# Patient Record
Sex: Female | Born: 1945
Health system: Southern US, Community
[De-identification: ages and names within clinical notes are randomized; demographics above are authoritative.]

## PROBLEM LIST (undated history)

## (undated) DIAGNOSIS — E1169 Type 2 diabetes mellitus with other specified complication: Secondary | ICD-10-CM

## (undated) DIAGNOSIS — R519 Headache, unspecified: Secondary | ICD-10-CM

## (undated) DIAGNOSIS — Z9861 Coronary angioplasty status: Secondary | ICD-10-CM

## (undated) DIAGNOSIS — D649 Anemia, unspecified: Secondary | ICD-10-CM

## (undated) DIAGNOSIS — I2102 ST elevation (STEMI) myocardial infarction involving left anterior descending coronary artery: Secondary | ICD-10-CM

## (undated) DIAGNOSIS — I350 Nonrheumatic aortic (valve) stenosis: Secondary | ICD-10-CM

## (undated) DIAGNOSIS — K219 Gastro-esophageal reflux disease without esophagitis: Secondary | ICD-10-CM

## (undated) DIAGNOSIS — M1990 Unspecified osteoarthritis, unspecified site: Secondary | ICD-10-CM

## (undated) DIAGNOSIS — E785 Hyperlipidemia, unspecified: Secondary | ICD-10-CM

## (undated) DIAGNOSIS — T8859XA Other complications of anesthesia, initial encounter: Secondary | ICD-10-CM

## (undated) DIAGNOSIS — I2510 Atherosclerotic heart disease of native coronary artery without angina pectoris: Secondary | ICD-10-CM

## (undated) DIAGNOSIS — R51 Headache: Secondary | ICD-10-CM

## (undated) DIAGNOSIS — I10 Essential (primary) hypertension: Secondary | ICD-10-CM

## (undated) DIAGNOSIS — E669 Obesity, unspecified: Secondary | ICD-10-CM

## (undated) HISTORY — DX: Type 2 diabetes mellitus with other specified complication: E66.9

## (undated) HISTORY — DX: ST elevation (STEMI) myocardial infarction involving left anterior descending coronary artery: I21.02

## (undated) HISTORY — DX: Coronary angioplasty status: Z98.61

## (undated) HISTORY — DX: Hyperlipidemia, unspecified: E78.5

## (undated) HISTORY — PX: ABDOMINAL HYSTERECTOMY: SHX81

## (undated) HISTORY — DX: Type 2 diabetes mellitus with other specified complication: E11.69

## (undated) HISTORY — PX: OTHER SURGICAL HISTORY: SHX169

## (undated) HISTORY — DX: Essential (primary) hypertension: I10

## (undated) HISTORY — DX: Atherosclerotic heart disease of native coronary artery without angina pectoris: I25.10

---

## 1998-12-09 ENCOUNTER — Other Ambulatory Visit: Admission: RE | Admit: 1998-12-09 | Discharge: 1998-12-09 | Payer: Self-pay | Admitting: Family Medicine

## 1998-12-16 ENCOUNTER — Ambulatory Visit (HOSPITAL_COMMUNITY): Admission: RE | Admit: 1998-12-16 | Discharge: 1998-12-16 | Payer: Self-pay | Admitting: Family Medicine

## 1998-12-16 ENCOUNTER — Encounter: Payer: Self-pay | Admitting: Family Medicine

## 2001-12-30 ENCOUNTER — Encounter: Payer: Self-pay | Admitting: Family Medicine

## 2001-12-30 ENCOUNTER — Ambulatory Visit (HOSPITAL_COMMUNITY): Admission: RE | Admit: 2001-12-30 | Discharge: 2001-12-30 | Payer: Self-pay | Admitting: Family Medicine

## 2002-06-11 ENCOUNTER — Ambulatory Visit (HOSPITAL_COMMUNITY): Admission: RE | Admit: 2002-06-11 | Discharge: 2002-06-11 | Payer: Self-pay | Admitting: Family Medicine

## 2002-06-11 ENCOUNTER — Encounter: Payer: Self-pay | Admitting: Family Medicine

## 2002-07-23 ENCOUNTER — Other Ambulatory Visit: Admission: RE | Admit: 2002-07-23 | Discharge: 2002-07-23 | Payer: Self-pay | Admitting: Family Medicine

## 2002-08-26 ENCOUNTER — Encounter: Payer: Self-pay | Admitting: Emergency Medicine

## 2002-08-26 ENCOUNTER — Emergency Department (HOSPITAL_COMMUNITY): Admission: EM | Admit: 2002-08-26 | Discharge: 2002-08-26 | Payer: Self-pay | Admitting: Emergency Medicine

## 2009-12-01 ENCOUNTER — Inpatient Hospital Stay (HOSPITAL_COMMUNITY): Admission: EM | Admit: 2009-12-01 | Discharge: 2009-12-05 | Payer: Self-pay | Admitting: Emergency Medicine

## 2009-12-01 ENCOUNTER — Ambulatory Visit: Payer: Self-pay | Admitting: Internal Medicine

## 2009-12-01 ENCOUNTER — Ambulatory Visit: Payer: Self-pay | Admitting: Cardiology

## 2009-12-02 ENCOUNTER — Encounter (INDEPENDENT_AMBULATORY_CARE_PROVIDER_SITE_OTHER): Payer: Self-pay | Admitting: Internal Medicine

## 2009-12-02 ENCOUNTER — Ambulatory Visit: Payer: Self-pay | Admitting: Vascular Surgery

## 2010-06-24 LAB — LACTIC ACID, PLASMA
Lactic Acid, Venous: 3 mmol/L — ABNORMAL HIGH (ref 0.5–2.2)
Lactic Acid, Venous: 3.4 mmol/L — ABNORMAL HIGH (ref 0.5–2.2)

## 2010-06-24 LAB — CBC
HCT: 34.4 % — ABNORMAL LOW (ref 36.0–46.0)
HCT: 40.9 % (ref 36.0–46.0)
Hemoglobin: 11.2 g/dL — ABNORMAL LOW (ref 12.0–15.0)
Hemoglobin: 11.3 g/dL — ABNORMAL LOW (ref 12.0–15.0)
Hemoglobin: 12.7 g/dL (ref 12.0–15.0)
Hemoglobin: 13.9 g/dL (ref 12.0–15.0)
MCH: 29.7 pg (ref 26.0–34.0)
MCH: 30.5 pg (ref 26.0–34.0)
MCHC: 33 g/dL (ref 30.0–36.0)
MCHC: 33 g/dL (ref 30.0–36.0)
MCHC: 33.1 g/dL (ref 30.0–36.0)
MCHC: 34 g/dL (ref 30.0–36.0)
MCV: 90.2 fL (ref 78.0–100.0)
MCV: 90.3 fL (ref 78.0–100.0)
Platelets: 135 10*3/uL — ABNORMAL LOW (ref 150–400)
Platelets: 163 10*3/uL (ref 150–400)
RBC: 3.81 MIL/uL — ABNORMAL LOW (ref 3.87–5.11)
RBC: 4.27 MIL/uL (ref 3.87–5.11)
RDW: 12.8 % (ref 11.5–15.5)
RDW: 12.9 % (ref 11.5–15.5)
RDW: 12.9 % (ref 11.5–15.5)
WBC: 5.2 10*3/uL (ref 4.0–10.5)

## 2010-06-24 LAB — PROTEIN S ACTIVITY: Protein S Activity: 76 % (ref 69–129)

## 2010-06-24 LAB — GLUCOSE, CAPILLARY
Glucose-Capillary: 109 mg/dL — ABNORMAL HIGH (ref 70–99)
Glucose-Capillary: 144 mg/dL — ABNORMAL HIGH (ref 70–99)
Glucose-Capillary: 159 mg/dL — ABNORMAL HIGH (ref 70–99)
Glucose-Capillary: 166 mg/dL — ABNORMAL HIGH (ref 70–99)
Glucose-Capillary: 172 mg/dL — ABNORMAL HIGH (ref 70–99)
Glucose-Capillary: 215 mg/dL — ABNORMAL HIGH (ref 70–99)
Glucose-Capillary: 279 mg/dL — ABNORMAL HIGH (ref 70–99)
Glucose-Capillary: 79 mg/dL (ref 70–99)

## 2010-06-24 LAB — BASIC METABOLIC PANEL
BUN: 10 mg/dL (ref 6–23)
BUN: 13 mg/dL (ref 6–23)
CO2: 27 mEq/L (ref 19–32)
Calcium: 9.1 mg/dL (ref 8.4–10.5)
Calcium: 9.8 mg/dL (ref 8.4–10.5)
Chloride: 99 mEq/L (ref 96–112)
Creatinine, Ser: 0.91 mg/dL (ref 0.4–1.2)
Creatinine, Ser: 0.93 mg/dL (ref 0.4–1.2)
GFR calc non Af Amer: >60 mL/min (ref >60)
GFR calc non Af Amer: >60 mL/min (ref >60)
Glucose, Bld: 165 mg/dL — ABNORMAL HIGH (ref 70–99)
Glucose, Bld: 308 mg/dL — ABNORMAL HIGH (ref 70–99)
Sodium: 135 mEq/L (ref 135–145)
Sodium: 139 mEq/L (ref 135–145)

## 2010-06-24 LAB — COMPREHENSIVE METABOLIC PANEL
ALT: 15 U/L (ref 0–35)
AST: 18 U/L (ref 0–37)
Albumin: 3.2 g/dL — ABNORMAL LOW (ref 3.5–5.2)
Calcium: 9.3 mg/dL (ref 8.4–10.5)
GFR calc Af Amer: >60 mL/min (ref >60)
Sodium: 140 mEq/L (ref 135–145)
Total Protein: 6.6 g/dL (ref 6.0–8.3)

## 2010-06-24 LAB — DIFFERENTIAL
Eosinophils Absolute: 0.1 10*3/uL (ref 0.0–0.7)
Eosinophils Absolute: 0.1 10*3/uL (ref 0.0–0.7)
Eosinophils Relative: 2 % (ref 0–5)
Lymphs Abs: 1.6 10*3/uL (ref 0.7–4.0)
Lymphs Abs: 2.3 10*3/uL (ref 0.7–4.0)
Monocytes Absolute: 0.4 10*3/uL (ref 0.1–1.0)
Monocytes Relative: 5 % (ref 3–12)
Monocytes Relative: 7 % (ref 3–12)
Neutro Abs: 6.2 10*3/uL (ref 1.7–7.7)

## 2010-06-24 LAB — POCT CARDIAC MARKERS: Troponin i, poc: <0.05 ng/mL (ref 0.00–0.09)

## 2010-06-24 LAB — LIPID PANEL
LDL Cholesterol: 166 mg/dL — ABNORMAL HIGH (ref 0–99)
Total CHOL/HDL Ratio: 5.3 RATIO
Triglycerides: 183 mg/dL — ABNORMAL HIGH (ref <150)
VLDL: 37 mg/dL (ref 0–40)

## 2010-06-24 LAB — CK TOTAL AND CKMB (NOT AT ARMC)
CK, MB: 3.4 ng/mL (ref 0.3–4.0)
Relative Index: 3.3 — ABNORMAL HIGH (ref 0.0–2.5)

## 2010-06-24 LAB — TROPONIN I: Troponin I: 0.17 ng/mL — ABNORMAL HIGH (ref 0.00–0.06)

## 2010-06-24 LAB — FACTOR 5 LEIDEN

## 2010-06-24 LAB — POCT I-STAT, CHEM 8
Calcium, Ion: 1.14 mmol/L (ref 1.12–1.32)
Chloride: 101 mEq/L (ref 96–112)
Creatinine, Ser: 1.1 mg/dL (ref 0.4–1.2)
Glucose, Bld: 362 mg/dL — ABNORMAL HIGH (ref 70–99)
HCT: 45 % (ref 36.0–46.0)
Hemoglobin: 15.3 g/dL — ABNORMAL HIGH (ref 12.0–15.0)
Potassium: 3.2 mEq/L — ABNORMAL LOW (ref 3.5–5.1)

## 2010-06-24 LAB — LUPUS ANTICOAGULANT PANEL
DRVVT: 50.3 secs — ABNORMAL HIGH (ref 36.2–44.3)
dRVVT Incubated 1:1 Mix: 42 secs (ref 36.2–44.3)

## 2010-06-24 LAB — PROTIME-INR
INR: 1.11 (ref 0.00–1.49)
INR: 1.21 (ref 0.00–1.49)
INR: 1.53 — ABNORMAL HIGH (ref 0.00–1.49)
Prothrombin Time: 14.6 seconds (ref 11.6–15.2)
Prothrombin Time: 15.5 seconds — ABNORMAL HIGH (ref 11.6–15.2)

## 2010-06-24 LAB — CARDIAC PANEL(CRET KIN+CKTOT+MB+TROPI)
CK, MB: 3.7 ng/mL (ref 0.3–4.0)
CK, MB: 4.5 ng/mL — ABNORMAL HIGH (ref 0.3–4.0)
Total CK: 116 U/L (ref 7–177)
Troponin I: 0.14 ng/mL — ABNORMAL HIGH (ref 0.00–0.06)
Troponin I: 0.23 ng/mL — ABNORMAL HIGH (ref 0.00–0.06)

## 2010-06-24 LAB — PROTEIN S, TOTAL: Protein S Ag, Total: 151 % — ABNORMAL HIGH (ref 70–140)

## 2010-06-24 LAB — CARDIOLIPIN ANTIBODIES, IGG, IGM, IGA: Anticardiolipin IgA: 2 APL U/mL — ABNORMAL LOW (ref <22)

## 2010-06-24 LAB — T4, FREE: Free T4: 1.04 ng/dL (ref 0.80–1.80)

## 2010-06-24 LAB — PROTEIN C, TOTAL: Protein C, Total: 110 % (ref 70–140)

## 2010-06-24 LAB — HEPARIN LEVEL (UNFRACTIONATED): Heparin Unfractionated: 1.16 IU/mL — ABNORMAL HIGH (ref 0.30–0.70)

## 2010-06-24 LAB — BRAIN NATRIURETIC PEPTIDE: Pro B Natriuretic peptide (BNP): 66 pg/mL (ref 0.0–100.0)

## 2013-02-08 DIAGNOSIS — I2510 Atherosclerotic heart disease of native coronary artery without angina pectoris: Secondary | ICD-10-CM

## 2013-02-08 DIAGNOSIS — Z9861 Coronary angioplasty status: Secondary | ICD-10-CM

## 2013-02-08 DIAGNOSIS — I2102 ST elevation (STEMI) myocardial infarction involving left anterior descending coronary artery: Secondary | ICD-10-CM

## 2013-02-08 HISTORY — DX: ST elevation (STEMI) myocardial infarction involving left anterior descending coronary artery: I21.02

## 2013-02-08 HISTORY — PX: PERCUTANEOUS CORONARY STENT INTERVENTION (PCI-S): SHX6016

## 2013-02-08 HISTORY — DX: Atherosclerotic heart disease of native coronary artery without angina pectoris: I25.10

## 2013-02-08 HISTORY — PX: CARDIAC CATHETERIZATION: SHX172

## 2013-02-08 HISTORY — DX: Coronary angioplasty status: Z98.61

## 2013-02-15 ENCOUNTER — Emergency Department (HOSPITAL_COMMUNITY): Payer: PRIVATE HEALTH INSURANCE

## 2013-02-15 ENCOUNTER — Encounter (HOSPITAL_COMMUNITY): Payer: Self-pay | Admitting: Emergency Medicine

## 2013-02-15 ENCOUNTER — Inpatient Hospital Stay (HOSPITAL_COMMUNITY)
Admission: EM | Admit: 2013-02-15 | Discharge: 2013-02-18 | DRG: 247 | Disposition: A | Discharge status: Home / Self Care | Payer: PRIVATE HEALTH INSURANCE | Attending: Cardiovascular Disease | Admitting: Cardiovascular Disease

## 2013-02-15 DIAGNOSIS — R011 Cardiac murmur, unspecified: Secondary | ICD-10-CM | POA: Diagnosis present

## 2013-02-15 DIAGNOSIS — I214 Non-ST elevation (NSTEMI) myocardial infarction: Secondary | ICD-10-CM

## 2013-02-15 DIAGNOSIS — Z79899 Other long term (current) drug therapy: Secondary | ICD-10-CM

## 2013-02-15 DIAGNOSIS — Z7902 Long term (current) use of antithrombotics/antiplatelets: Secondary | ICD-10-CM

## 2013-02-15 DIAGNOSIS — Z86711 Personal history of pulmonary embolism: Secondary | ICD-10-CM

## 2013-02-15 DIAGNOSIS — I2510 Atherosclerotic heart disease of native coronary artery without angina pectoris: Secondary | ICD-10-CM | POA: Diagnosis present

## 2013-02-15 DIAGNOSIS — E669 Obesity, unspecified: Secondary | ICD-10-CM | POA: Diagnosis present

## 2013-02-15 DIAGNOSIS — Z6841 Body Mass Index (BMI) 40.0 and over, adult: Secondary | ICD-10-CM

## 2013-02-15 DIAGNOSIS — Z955 Presence of coronary angioplasty implant and graft: Secondary | ICD-10-CM

## 2013-02-15 DIAGNOSIS — I2699 Other pulmonary embolism without acute cor pulmonale: Secondary | ICD-10-CM | POA: Diagnosis present

## 2013-02-15 DIAGNOSIS — E785 Hyperlipidemia, unspecified: Secondary | ICD-10-CM

## 2013-02-15 DIAGNOSIS — E7800 Pure hypercholesterolemia, unspecified: Secondary | ICD-10-CM | POA: Diagnosis present

## 2013-02-15 DIAGNOSIS — E119 Type 2 diabetes mellitus without complications: Secondary | ICD-10-CM

## 2013-02-15 DIAGNOSIS — Z23 Encounter for immunization: Secondary | ICD-10-CM

## 2013-02-15 DIAGNOSIS — Z91041 Radiographic dye allergy status: Secondary | ICD-10-CM

## 2013-02-15 DIAGNOSIS — I10 Essential (primary) hypertension: Secondary | ICD-10-CM | POA: Diagnosis present

## 2013-02-15 DIAGNOSIS — K5900 Constipation, unspecified: Secondary | ICD-10-CM | POA: Diagnosis not present

## 2013-02-15 DIAGNOSIS — Z7982 Long term (current) use of aspirin: Secondary | ICD-10-CM

## 2013-02-15 LAB — BASIC METABOLIC PANEL
BUN: 17 mg/dL (ref 6–23)
Calcium: 10.9 mg/dL — ABNORMAL HIGH (ref 8.4–10.5)
Creatinine, Ser: 1.04 mg/dL (ref 0.50–1.10)
GFR calc Af Amer: 64 mL/min — ABNORMAL LOW (ref >90)

## 2013-02-15 LAB — POCT I-STAT TROPONIN I: Troponin i, poc: 4.02 ng/mL (ref 0.00–0.08)

## 2013-02-15 LAB — CBC WITH DIFFERENTIAL/PLATELET
Basophils Absolute: 0 10*3/uL (ref 0.0–0.1)
Basophils Relative: 0 % (ref 0–1)
Basophils Relative: 0 % (ref 0–1)
Eosinophils Absolute: 0.1 10*3/uL (ref 0.0–0.7)
Eosinophils Relative: 1 % (ref 0–5)
HCT: 35.2 % — ABNORMAL LOW (ref 36.0–46.0)
Hemoglobin: 11.9 g/dL — ABNORMAL LOW (ref 12.0–15.0)
Lymphocytes Relative: 22 % (ref 12–46)
Lymphs Abs: 2.6 10*3/uL (ref 0.7–4.0)
MCH: 30.1 pg (ref 26.0–34.0)
MCHC: 33.9 g/dL (ref 30.0–36.0)
MCHC: 33.8 g/dL (ref 30.0–36.0)
MCV: 88.6 fL (ref 78.0–100.0)
MCV: 89.1 fL (ref 78.0–100.0)
Monocytes Absolute: 0.4 10*3/uL (ref 0.1–1.0)
Monocytes Relative: 5 % (ref 3–12)
Neutro Abs: 6 10*3/uL (ref 1.7–7.7)
Neutrophils Relative %: 61 % (ref 43–77)
Platelets: 187 10*3/uL (ref 150–400)
Platelets: 186 10*3/uL (ref 150–400)
RBC: 3.95 MIL/uL (ref 3.87–5.11)
RDW: 12.7 % (ref 11.5–15.5)
WBC: 8.3 10*3/uL (ref 4.0–10.5)

## 2013-02-15 LAB — TROPONIN I
Troponin I: 5.91 ng/mL (ref <0.30)
Troponin I: 9.5 ng/mL (ref <0.30)

## 2013-02-15 LAB — COMPREHENSIVE METABOLIC PANEL
ALT: 14 U/L (ref 0–35)
Alkaline Phosphatase: 89 U/L (ref 39–117)
BUN: 17 mg/dL (ref 6–23)
CO2: 28 mEq/L (ref 19–32)
Calcium: 10.4 mg/dL (ref 8.4–10.5)
Creatinine, Ser: 1.13 mg/dL — ABNORMAL HIGH (ref 0.50–1.10)
GFR calc Af Amer: 58 mL/min — ABNORMAL LOW (ref >90)
GFR calc non Af Amer: 50 mL/min — ABNORMAL LOW (ref >90)
Glucose, Bld: 196 mg/dL — ABNORMAL HIGH (ref 70–99)
Sodium: 137 mEq/L (ref 135–145)

## 2013-02-15 LAB — GLUCOSE, CAPILLARY
Glucose-Capillary: 130 mg/dL — ABNORMAL HIGH (ref 70–99)
Glucose-Capillary: 195 mg/dL — ABNORMAL HIGH (ref 70–99)

## 2013-02-15 LAB — MAGNESIUM: Magnesium: 1.3 mg/dL — ABNORMAL LOW (ref 1.5–2.5)

## 2013-02-15 MED ORDER — HYDROCODONE-ACETAMINOPHEN 7.5-325 MG PO TABS: 1.0000 | ORAL_TABLET | Freq: Two times a day (BID) | ORAL | Status: DC | PRN

## 2013-02-15 MED ORDER — ATORVASTATIN CALCIUM 80 MG PO TABS
80.0000 mg | ORAL_TABLET | Freq: Every day | ORAL | Status: DC
  Administered 2013-02-15 – 2013-02-17 (×3): 80 mg via ORAL
  Filled 2013-02-15 (×4): qty 1

## 2013-02-15 MED ORDER — ONDANSETRON HCL 4 MG/2ML IJ SOLN
4.0000 mg | Freq: Four times a day (QID) | INTRAMUSCULAR | Status: DC | PRN
  Administered 2013-02-15: 4 mg via INTRAVENOUS
  Filled 2013-02-15: qty 2

## 2013-02-15 MED ORDER — HYDROMORPHONE HCL PF 1 MG/ML IJ SOLN
1.0000 mg | INTRAMUSCULAR | Status: AC | PRN
  Administered 2013-02-15: 1 mg via INTRAVENOUS
  Filled 2013-02-15: qty 1

## 2013-02-15 MED ORDER — NITROGLYCERIN 0.4 MG SL SUBL
0.4000 mg | SUBLINGUAL_TABLET | SUBLINGUAL | Status: DC | PRN
  Administered 2013-02-16 (×3): 0.4 mg via SUBLINGUAL
  Filled 2013-02-15: qty 25

## 2013-02-15 MED ORDER — ASPIRIN EC 81 MG PO TBEC
81.0000 mg | DELAYED_RELEASE_TABLET | Freq: Every day | ORAL | Status: DC
  Administered 2013-02-16: 81 mg via ORAL
  Filled 2013-02-15: qty 1

## 2013-02-15 MED ORDER — CHLORTHALIDONE 25 MG PO TABS
25.0000 mg | ORAL_TABLET | Freq: Every day | ORAL | Status: DC
  Administered 2013-02-16 – 2013-02-18 (×3): 25 mg via ORAL
  Filled 2013-02-15 (×3): qty 1

## 2013-02-15 MED ORDER — NITROGLYCERIN 0.4 MG SL SUBL
0.4000 mg | SUBLINGUAL_TABLET | SUBLINGUAL | Status: DC | PRN
  Administered 2013-02-15 (×2): 0.4 mg via SUBLINGUAL
  Filled 2013-02-15: qty 25

## 2013-02-15 MED ORDER — METOPROLOL TARTRATE 12.5 MG HALF TABLET
12.5000 mg | ORAL_TABLET | Freq: Two times a day (BID) | ORAL | Status: DC
  Administered 2013-02-15 – 2013-02-18 (×6): 12.5 mg via ORAL
  Filled 2013-02-15 (×7): qty 1

## 2013-02-15 MED ORDER — ASPIRIN 325 MG PO TABS
325.0000 mg | ORAL_TABLET | Freq: Every day | ORAL | Status: DC
  Administered 2013-02-15: 325 mg via ORAL
  Filled 2013-02-15: qty 1

## 2013-02-15 MED ORDER — MORPHINE SULFATE 4 MG/ML IJ SOLN
4.0000 mg | Freq: Once | INTRAMUSCULAR | Status: AC
  Administered 2013-02-15: 4 mg via INTRAVENOUS
  Filled 2013-02-15: qty 1

## 2013-02-15 MED ORDER — LOSARTAN POTASSIUM 50 MG PO TABS
100.0000 mg | ORAL_TABLET | Freq: Every day | ORAL | Status: DC
  Administered 2013-02-16 – 2013-02-18 (×3): 100 mg via ORAL
  Filled 2013-02-15 (×3): qty 2

## 2013-02-15 MED ORDER — HEPARIN (PORCINE) IN NACL 100-0.45 UNIT/ML-% IJ SOLN
1050.0000 [IU]/h | INTRAMUSCULAR | Status: DC
  Administered 2013-02-15 – 2013-02-16 (×2): 1050 [IU]/h via INTRAVENOUS
  Filled 2013-02-15 (×3): qty 250

## 2013-02-15 MED ORDER — ONDANSETRON HCL 4 MG/2ML IJ SOLN: 4.0000 mg | Freq: Three times a day (TID) | INTRAMUSCULAR | Status: DC | PRN

## 2013-02-15 MED ORDER — HEPARIN BOLUS VIA INFUSION
4000.0000 [IU] | Freq: Once | INTRAVENOUS | Status: AC
  Administered 2013-02-15: 4000 [IU] via INTRAVENOUS
  Filled 2013-02-15: qty 4000

## 2013-02-15 MED ORDER — ACETAMINOPHEN 325 MG PO TABS
650.0000 mg | ORAL_TABLET | ORAL | Status: DC | PRN
  Administered 2013-02-16 – 2013-02-17 (×2): 650 mg via ORAL
  Filled 2013-02-15 (×2): qty 2

## 2013-02-15 NOTE — ED Notes (Signed)
Pt c/o intermittent chest pain x 2 weeks.  Pt states pain began after she was unable to chew her food b/c she had all her teeth pulled and felt her food get "stuck in her throat".  Now the pain is unbearable and the pain radiates to both arms.  C/o some sob but denies nausea and is not diaphoretic.

## 2013-02-15 NOTE — ED Provider Notes (Signed)
CSN: [id number]     Arrival date & time 02/15/13  1441 History   First MD Initiated Contact with Patient 02/15/13 1454     Chief Complaint  Patient presents with  . Chest Pain  . Shortness of Breath   (Consider location/radiation/quality/duration/timing/severity/associated sxs/prior Treatment) The history is provided by the patient and medical records.   This is a 67 year old female with past history significant for diabetes, hypertension, hyperlipidemia, presenting to the ED for intermittent chest pain x2 weeks. Patient states the pain initially started after having dental work done 2 weeks ago. States she has had multiple dental procedures and teeth extracted over the past 2 weeks. Patient is currently on a soft diet and unable to chew her food well. She thought she felt some chicken get "stuck in her throat" and has been taking TUMS for that his indigestion. Patient states it is intermittent, lasting as long as an hour with each episode. There is some radiation of pain into bilateral upper extremities with noted paresthesias of left upper extremity She notes some occasional shortness of breath but denies any palpitations, diaphoresis, nausea, or vomiting. The patient was evaluated by cardiology 2010 after a syncopal episode with a negative workup. She has no family history of cardiac disease. She was never smoker.  Prior cardiac imaging includes TEE on 12/01/09 Impressions: - Normal LV size and systolic function, EF 60-65%. The   interventricular septum was D-shaped in diastole, suggestive of RV   volume overload. Moderately dilated RV with mild systolic   dysfunction. Mild pulmonary hypertension. IVC not dilated.   Past Medical History  Diagnosis Date  . Diabetes mellitus without complication   . Hypertension   . Hypercholesteremia    Past Surgical History  Procedure Laterality Date  . Abdominal hysterectomy    . Knee surgery     No family history on file. History  Substance Use  Topics  . Smoking status: Never Smoker   . Smokeless tobacco: Not on file  . Alcohol Use: No   OB History   Grav Para Term Preterm Abortions TAB SAB Ect Mult Living                 Review of Systems  Respiratory: Positive for shortness of breath.   Cardiovascular: Positive for chest pain.  All other systems reviewed and are negative.    Allergies  Iohexol  Home Medications  No current outpatient prescriptions on file. BP 167/111  Pulse 95  Temp(Src) 98.3 F (36.8 C) (Oral)  Resp 16  Ht 5\' 3"  (1.6 m)  Wt 231 lb 14.4 oz (105.189 kg)  BMI 41.09 kg/m2  SpO2 92%  Physical Exam  Nursing note and vitals reviewed. Constitutional: She is oriented to person, place, and time. She appears well-developed and well-nourished. No distress.  HENT:  Head: Normocephalic and atraumatic.  Mouth/Throat: Oropharynx is clear and moist.  Most teeth have been surgically extracted; no active bleeding or gingival abnormalities  Eyes: Conjunctivae and EOM are normal. Pupils are equal, round, and reactive to light.  Neck: Normal range of motion. Neck supple.  Cardiovascular: Normal rate, regular rhythm and normal heart sounds.   Pulmonary/Chest: Effort normal and breath sounds normal. No respiratory distress. She has no wheezes.  Abdominal: Soft. Bowel sounds are normal. There is no tenderness. There is no guarding.  Musculoskeletal: Normal range of motion.  Neurological: She is alert and oriented to person, place, and time.  Skin: Skin is warm and dry. She is not diaphoretic.  Psychiatric: She has a normal mood and affect.    ED Course  Procedures (including critical care time) Labs Review Labs Reviewed  BASIC METABOLIC PANEL - Abnormal; Notable for the following:    Sodium 134 (*)    Potassium 3.4 (*)    Glucose, Bld 179 (*)    Calcium 10.9 (*)    GFR calc non Af Amer 55 (*)    GFR calc Af Amer 64 (*)    All other components within normal limits  POCT I-STAT TROPONIN I - Abnormal;  Notable for the following:    Troponin i, poc 4.02 (*)    All other components within normal limits  CBC WITH DIFFERENTIAL  TROPONIN I  HEPARIN LEVEL (UNFRACTIONATED)  HEPARIN LEVEL (UNFRACTIONATED)  CBC   Imaging Review No results found.  EKG Interpretation     Ventricular Rate:  95 PR Interval:  160 QRS Duration: 86 QT Interval:  360 QTC Calculation: 452 R Axis:   83 Text Interpretation:  Normal sinus rhythm Anteroseptal infarct , age undetermined q waves in anterior leads, new from prior Abnormal ECG            MDM   1. NSTEMI (non-ST elevated myocardial infarction)    EKG with Q waves in anterior leads, new from previous.  CXR clear.  Troponin elevated at 4.02, repeat troponin pending. Patient given aspirin and heparin per pharmacy started. Consult cardiology, Dr. Tresa Endo, patient to be admitted to step down unit.  VS stable at this time.  Garlon Hatchet, PA-C 02/15/13 1831

## 2013-02-15 NOTE — Progress Notes (Signed)
ANTICOAGULATION CONSULT NOTE - Initial Consult  Pharmacy Consult for Heparin Indication: chest pain/ACS  Allergies  Allergen Reactions  . Iohexol Hives and Other (See Comments)    Excessive sweating    Patient Measurements: Height: 5\' 3"  (160 cm) Weight: 231 lb 14.4 oz (105.189 kg) IBW/kg (Calculated) : 52.4 Heparin Dosing Weight: 77 kg  Vital Signs: Temp: 98.3 F (36.8 C) (11/08 1448) Temp src: Oral (11/08 1448) BP: 147/95 mmHg (11/08 1640) Pulse Rate: 85 (11/08 1640)  Labs:  Recent Labs  02/15/13 1645  HGB 12.9  HCT 38.1  PLT 187  CREATININE 1.04    Estimated Creatinine Clearance: 62.6 ml/min (by C-G formula based on Cr of 1.04).   Medical History: Past Medical History  Diagnosis Date  . Diabetes mellitus without complication   . Hypertension   . Hypercholesteremia     Medications:  See med rec  Assessment: 67 y.o. female presents with chest pain. Trop 4.02. To begin heparin for ACS. CBC stable at baseline.   Goal of Therapy:  Heparin level 0.3-0.7 units/ml Monitor platelets by anticoagulation protocol: Yes   Plan:  1. Heparin IV bolus 4000 units 2. Heparin IV gtt at 1050 units/hr 3. Will f/u 6 hr heparin level 4. Daily heparin level and CBC  Christoper Fabian, PharmD, BCPS Clinical pharmacist, pager [phone] 02/15/2013,5:32 PM

## 2013-02-15 NOTE — Progress Notes (Signed)
Case Manager met patient at bedside.Role of case manager explained. Patient EPIC information not yet displaying demographics for Insurance / PCP.Patient reports she is a patient of Dr Parke Simmers. Patient reports she has MEDICAID.Education provided on medicaid resources,calling the helpline on her MEDICAID card,and making an appointment with DSS if she had questions related to  [employer].Patient reports she has no issues getting her medications filled.No Case Manager needs at this time.

## 2013-02-15 NOTE — ED Provider Notes (Signed)
Medical screening examination/treatment/procedure(s) were performed by non-physician practitioner and as supervising physician I was immediately available for consultation/collaboration.  Patient found to have troponin of >4.  ASA and heparin given and cardiology called.  No acute ischemia evident on EKG.  EKG Interpretation     Ventricular Rate:  95 PR Interval:  160 QRS Duration: 86 QT Interval:  360 QTC Calculation: 452 R Axis:   83 Text Interpretation:  Normal sinus rhythm Anteroseptal infarct , age undetermined q waves in anterior leads, new from prior Abnormal ECG             Shon Baton, MD 02/15/13 2240

## 2013-02-15 NOTE — H&P (Signed)
Phyllis Conner is an 67 y.o. female.   Chief Complaint: Intermittent Chest Pain HPI: Phyllis Conner is a 67 yo woman with PMH of T2DM, hypertension, dyslipidemia and prior PE who comes in with intermittent chest pain. Over the last two weeks she's had intermittent chest pain that seems to have began around the time she had multiple dental extractions (no significant bleeding). The also had thought she's had some things getting stuck in her throat and has been taking Tums. She describes the chest pain as a pressure sensation, radiating to both shoulder with some associated shortness of breath with episodes lasting as long as 1 hour most recently leading to presentation to ER this afternoon/evening. In the ER her ECG was notable for anteroseptal q waves without acute ST changes or reciprocal changes and an elevated troponin of 4.0. She was started on heparin and she received a large aspirin. She's currently CP free but did have a brief episode in the ER with some relief by the first NTG but felt worse with second NTG. No N/V/D. No recent travel. Some malaise. We discussed the findings in the ER and currently therapy for MI and likely LHC on Monday unless symptoms or clinical course changes.     Past Medical History  Diagnosis Date  . Diabetes mellitus without complication   . Hypertension   . Hypercholesteremia     Past Surgical History  Procedure Laterality Date  . Abdominal hysterectomy    . Knee surgery      No family history on file. Social History:  reports that she has never smoked. She does not have any smokeless tobacco history on file. She reports that she does not drink alcohol or use illicit drugs. No known family history of MI or CAD Allergies:  Allergies  Allergen Reactions  . Iohexol Hives and Other (See Comments)    Excessive sweating     (Not in a hospital admission)  Results for orders placed during the hospital encounter of 02/15/13 (from the past 48 hour(s))  BASIC  METABOLIC PANEL     Status: Abnormal   Collection Time    02/15/13  4:45 PM      Result Value Range   Sodium 134 (*) 135 - 145 mEq/L   Potassium 3.4 (*) 3.5 - 5.1 mEq/L   Chloride 96  96 - 112 mEq/L   CO2 25  19 - 32 mEq/L   Glucose, Bld 179 (*) 70 - 99 mg/dL   BUN 17  6 - 23 mg/dL   Creatinine, Ser 6.29  0.50 - 1.10 mg/dL   Calcium 52.8 (*) 8.4 - 10.5 mg/dL   GFR calc non Af Amer 55 (*) >90 mL/min   GFR calc Af Amer 64 (*) >90 mL/min   Comment: (NOTE)     The eGFR has been calculated using the CKD EPI equation.     This calculation has not been validated in all clinical situations.     eGFR's persistently <90 mL/min signify possible Chronic Kidney     Disease.  CBC WITH DIFFERENTIAL     Status: None   Collection Time    02/15/13  4:45 PM      Result Value Range   WBC 8.3  4.0 - 10.5 K/uL   RBC 4.30  3.87 - 5.11 MIL/uL   Hemoglobin 12.9  12.0 - 15.0 g/dL   HCT 41.3  24.4 - 01.0 %   MCV 88.6  78.0 - 100.0 fL   MCH 30.0  26.0 - 34.0 pg   MCHC 33.9  30.0 - 36.0 g/dL   RDW 16.1  09.6 - 04.5 %   Platelets 187  150 - 400 K/uL   Neutrophils Relative % 72  43 - 77 %   Neutro Abs 6.0  1.7 - 7.7 K/uL   Lymphocytes Relative 22  12 - 46 %   Lymphs Abs 1.9  0.7 - 4.0 K/uL   Monocytes Relative 5  3 - 12 %   Monocytes Absolute 0.4  0.1 - 1.0 K/uL   Eosinophils Relative 1  0 - 5 %   Eosinophils Absolute 0.1  0.0 - 0.7 K/uL   Basophils Relative 0  0 - 1 %   Basophils Absolute 0.0  0.0 - 0.1 K/uL  POCT I-STAT TROPONIN I     Status: Abnormal   Collection Time    02/15/13  5:14 PM      Result Value Range   Troponin i, poc 4.02 (*) 0.00 - 0.08 ng/mL   Comment NOTIFIED PHYSICIAN     Comment 3            Comment: Due to the release kinetics of cTnI,     a negative result within the first hours     of the onset of symptoms does not rule out     myocardial infarction with certainty.     If myocardial infarction is still suspected,     repeat the test at appropriate intervals.   Dg  Chest 2 View  02/15/2013   CLINICAL DATA:  Chest pain, shortness of breath  EXAM: CHEST  2 VIEW  COMPARISON:  CTA chest dated 12/01/2009  FINDINGS: Lungs are clear. No pleural effusion or pneumothorax.  The heart is normal in size.  Degenerative changes of the visualized thoracolumbar spine.  IMPRESSION: No evidence of acute cardiopulmonary disease.   Electronically Signed   By: Charline Bills M.D.   On: 02/15/2013 15:49    Review of Systems  Constitutional: Positive for malaise/fatigue. Negative for fever, chills and weight loss.  HENT: Negative for ear pain.   Eyes: Negative for blurred vision, double vision and photophobia.  Respiratory: Positive for shortness of breath. Negative for cough.   Cardiovascular: Positive for chest pain. Negative for palpitations, orthopnea, claudication and leg swelling.  Gastrointestinal: Positive for heartburn. Negative for nausea, vomiting and abdominal pain.  Genitourinary: Negative for dysuria and urgency.  Musculoskeletal: Positive for joint pain. Negative for myalgias.  Skin: Negative for rash.  Neurological: Negative for dizziness, tingling, tremors and headaches.  Endo/Heme/Allergies: Negative for environmental allergies. Does not bruise/bleed easily.  Psychiatric/Behavioral: Negative for depression, suicidal ideas and substance abuse.    Blood pressure 133/80, pulse 76, temperature 98.3 F (36.8 C), temperature source Oral, resp. rate 15, height 5\' 3"  (1.6 m), weight 105.189 kg (231 lb 14.4 oz), SpO2 99.00%. Physical Exam  Nursing note and vitals reviewed. Constitutional: She is oriented to person, place, and time. She appears well-developed and well-nourished. No distress.  HENT:  Head: Normocephalic and atraumatic.  Nose: Nose normal.  Mouth/Throat: No oropharyngeal exudate.  Eyes: Conjunctivae and EOM are normal. Pupils are equal, round, and reactive to light. No scleral icterus.  Neck: Normal range of motion. Neck supple. No JVD present.  No tracheal deviation present. No thyromegaly present.  Cardiovascular: Normal rate, regular rhythm and intact distal pulses.  Exam reveals no gallop.   Murmur heard. Systolic murmur heard at LSB and LLSB  Respiratory: Effort normal and breath  sounds normal. No respiratory distress. She has no wheezes. She has no rales.  GI: Soft. Bowel sounds are normal. She exhibits no distension. There is no tenderness. There is no rebound.  Musculoskeletal: Normal range of motion. She exhibits no edema.  Neurological: She is alert and oriented to person, place, and time. No cranial nerve deficit.  Skin: Skin is warm and dry. No rash noted. She is not diaphoretic. No erythema.  Psychiatric: She has a normal mood and affect. Her behavior is normal.    Echo results reviewed from '11: EF 60-65%, D-shaped septum c/w RV volume overload, moderately dilated RV with mild dysfunction and mild PH.  Labs reviewed; na 134, K 3.4, bun 17/Cr 1.04, glucose 179, calcium 10.9, wbc 8.3, h/h 12.9/38, plt 187, Trop 4.02 ECG: HR 90s, sinus rhythm, anteroseptal q waves, nl axis  Problem List NSTEMI Anteroseptal q waves with troponin 4.02 T2DM Hypertension Dyslipidemia Systolic Murmur on Exam - AS, sclerotic Aortic valve vs. 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 She has had an NSTEMI, potentially recurrent. She's had recent dental extraction so will watch for any bleeding. She's received aspirin/heparin. Will treat medically and pursue LHC on Monday unless she has refractory chest pain or other clinical changes. Systolic murmur like TR with e/o mild TR on prior Echo. - stepdown admission - heparin gtt, aspirin, LHC on Monday AM unless refractory symptoms - BNP, TSH, lipid panel, Ng - Update Echo - order placed - statin, metoprolol  - daily  aspirin   Belia Febo 02/15/2013, 6:18 PM

## 2013-02-15 NOTE — ED Notes (Signed)
DR.Horton shown results of Istat Cardiac Marker. ED-Lab

## 2013-02-16 DIAGNOSIS — E119 Type 2 diabetes mellitus without complications: Secondary | ICD-10-CM

## 2013-02-16 DIAGNOSIS — E785 Hyperlipidemia, unspecified: Secondary | ICD-10-CM

## 2013-02-16 DIAGNOSIS — I059 Rheumatic mitral valve disease, unspecified: Secondary | ICD-10-CM

## 2013-02-16 DIAGNOSIS — I214 Non-ST elevation (NSTEMI) myocardial infarction: Secondary | ICD-10-CM

## 2013-02-16 DIAGNOSIS — I10 Essential (primary) hypertension: Secondary | ICD-10-CM

## 2013-02-16 HISTORY — PX: TRANSTHORACIC ECHOCARDIOGRAM: SHX275

## 2013-02-16 LAB — HEMOGLOBIN A1C: Hgb A1c MFr Bld: 9.3 % — ABNORMAL HIGH (ref <5.7)

## 2013-02-16 LAB — PROTIME-INR: INR: 1.14 (ref 0.00–1.49)

## 2013-02-16 LAB — CBC
MCH: 29.9 pg (ref 26.0–34.0)
MCHC: 32.7 g/dL (ref 30.0–36.0)
Platelets: 202 10*3/uL (ref 150–400)
RBC: 3.75 MIL/uL — ABNORMAL LOW (ref 3.87–5.11)

## 2013-02-16 LAB — GLUCOSE, CAPILLARY: Glucose-Capillary: 148 mg/dL — ABNORMAL HIGH (ref 70–99)

## 2013-02-16 LAB — BASIC METABOLIC PANEL
BUN: 18 mg/dL (ref 6–23)
CO2: 27 mEq/L (ref 19–32)
Calcium: 10 mg/dL (ref 8.4–10.5)
Chloride: 99 mEq/L (ref 96–112)
Creatinine, Ser: 1.18 mg/dL — ABNORMAL HIGH (ref 0.50–1.10)

## 2013-02-16 LAB — MRSA PCR SCREENING: MRSA by PCR: NEGATIVE

## 2013-02-16 LAB — LIPID PANEL
HDL: 58 mg/dL (ref >39)
LDL Cholesterol: 154 mg/dL — ABNORMAL HIGH (ref 0–99)
Total CHOL/HDL Ratio: 4.1 RATIO
Triglycerides: 137 mg/dL (ref <150)
VLDL: 27 mg/dL (ref 0–40)

## 2013-02-16 LAB — HEPARIN LEVEL (UNFRACTIONATED)
Heparin Unfractionated: 0.49 IU/mL (ref 0.30–0.70)
Heparin Unfractionated: 0.52 IU/mL (ref 0.30–0.70)

## 2013-02-16 LAB — TSH: TSH: 4.393 u[IU]/mL (ref 0.350–4.500)

## 2013-02-16 MED ORDER — SODIUM CHLORIDE 0.9 % IV SOLN: 250.0000 mL | INTRAVENOUS | Status: DC | PRN

## 2013-02-16 MED ORDER — FAMOTIDINE IN NACL 20-0.9 MG/50ML-% IV SOLN
20.0000 mg | INTRAVENOUS | Status: AC
  Administered 2013-02-17: 20 mg via INTRAVENOUS
  Filled 2013-02-16: qty 50

## 2013-02-16 MED ORDER — SODIUM CHLORIDE 0.9 % IV SOLN
1.0000 mL/kg/h | INTRAVENOUS | Status: DC
  Administered 2013-02-17: 1 mL/kg/h via INTRAVENOUS

## 2013-02-16 MED ORDER — POLYETHYLENE GLYCOL 3350 17 G PO PACK
17.0000 g | PACK | Freq: Every day | ORAL | Status: DC
  Administered 2013-02-16 – 2013-02-17 (×2): 17 g via ORAL
  Filled 2013-02-16 (×3): qty 1

## 2013-02-16 MED ORDER — PROMETHAZINE HCL 25 MG/ML IJ SOLN
12.5000 mg | Freq: Once | INTRAMUSCULAR | Status: AC
  Administered 2013-02-16: 03:00:00 via INTRAVENOUS
  Filled 2013-02-16: qty 1

## 2013-02-16 MED ORDER — ISOSORBIDE MONONITRATE ER 30 MG PO TB24
30.0000 mg | ORAL_TABLET | Freq: Every day | ORAL | Status: DC
  Administered 2013-02-16 – 2013-02-18 (×3): 30 mg via ORAL
  Filled 2013-02-16 (×3): qty 1

## 2013-02-16 MED ORDER — DIPHENHYDRAMINE HCL 50 MG/ML IJ SOLN
25.0000 mg | INTRAMUSCULAR | Status: AC
  Administered 2013-02-17: 25 mg via INTRAVENOUS
  Filled 2013-02-16: qty 1

## 2013-02-16 MED ORDER — SODIUM CHLORIDE 0.9 % IJ SOLN: 3.0000 mL | INTRAMUSCULAR | Status: DC | PRN

## 2013-02-16 MED ORDER — INSULIN ASPART 100 UNIT/ML ~~LOC~~ SOLN
0.0000 [IU] | Freq: Three times a day (TID) | SUBCUTANEOUS | Status: DC
  Administered 2013-02-16 – 2013-02-17 (×3): 1 [IU] via SUBCUTANEOUS
  Administered 2013-02-17: 18:00:00 2 [IU] via SUBCUTANEOUS
  Administered 2013-02-17: 1 [IU] via SUBCUTANEOUS

## 2013-02-16 MED ORDER — POTASSIUM CHLORIDE ER 10 MEQ PO TBCR
40.0000 meq | EXTENDED_RELEASE_TABLET | Freq: Once | ORAL | Status: AC
  Administered 2013-02-16: 40 meq via ORAL
  Filled 2013-02-16 (×2): qty 4

## 2013-02-16 MED ORDER — DOCUSATE SODIUM 100 MG PO CAPS
100.0000 mg | ORAL_CAPSULE | Freq: Two times a day (BID) | ORAL | Status: DC
  Administered 2013-02-16 – 2013-02-18 (×5): 100 mg via ORAL
  Filled 2013-02-16 (×6): qty 1

## 2013-02-16 MED ORDER — INSULIN ASPART 100 UNIT/ML ~~LOC~~ SOLN
0.0000 [IU] | Freq: Every day | SUBCUTANEOUS | Status: DC
  Administered 2013-02-16: 3 [IU] via SUBCUTANEOUS
  Administered 2013-02-17: 5 [IU] via SUBCUTANEOUS

## 2013-02-16 MED ORDER — METHYLPREDNISOLONE SODIUM SUCC 125 MG IJ SOLR
125.0000 mg | INTRAMUSCULAR | Status: AC
  Administered 2013-02-17: 125 mg via INTRAVENOUS
  Filled 2013-02-16: qty 2

## 2013-02-16 MED ORDER — ASPIRIN EC 81 MG PO TBEC
81.0000 mg | DELAYED_RELEASE_TABLET | Freq: Every day | ORAL | Status: DC
  Administered 2013-02-18: 81 mg via ORAL
  Filled 2013-02-16: qty 1

## 2013-02-16 MED ORDER — DIAZEPAM 5 MG PO TABS
5.0000 mg | ORAL_TABLET | ORAL | Status: AC
  Administered 2013-02-17: 5 mg via ORAL
  Filled 2013-02-16: qty 1

## 2013-02-16 MED ORDER — SODIUM CHLORIDE 0.9 % IJ SOLN
3.0000 mL | Freq: Two times a day (BID) | INTRAMUSCULAR | Status: DC
  Administered 2013-02-17: 3 mL via INTRAVENOUS

## 2013-02-16 MED ORDER — FAMOTIDINE 20 MG PO TABS
20.0000 mg | ORAL_TABLET | ORAL | Status: AC
  Administered 2013-02-16: 20 mg via ORAL
  Filled 2013-02-16 (×2): qty 1

## 2013-02-16 MED ORDER — ASPIRIN 81 MG PO CHEW
81.0000 mg | CHEWABLE_TABLET | ORAL | Status: AC
  Administered 2013-02-17: 81 mg via ORAL
  Filled 2013-02-16: qty 1

## 2013-02-16 MED ORDER — INFLUENZA VAC SPLIT QUAD 0.5 ML IM SUSP
0.5000 mL | INTRAMUSCULAR | Status: AC
  Administered 2013-02-18: 0.5 mL via INTRAMUSCULAR
  Filled 2013-02-16: qty 0.5

## 2013-02-16 NOTE — Progress Notes (Signed)
Pt. Has episode of chest pain 9/10 given 3 doses of nitro SL pain rate down to 4/10. 12 lead EKG done vitals signs stable O2 2l applied.Dr. Antoine Poche was paged and made aware of pts. chest pain and vitals signs. With orders made and will continue to monitor.

## 2013-02-16 NOTE — Progress Notes (Signed)
ANTICOAGULATION CONSULT NOTE - Follow Up Consult  Pharmacy Consult for Heparin Indication: chest pain/ACS  Allergies  Allergen Reactions  . Iohexol Hives and Other (See Comments)    Excessive sweating    Patient Measurements: Height: 5\' 3"  (160 cm) Weight: 234 lb 5.6 oz (106.3 kg) IBW/kg (Calculated) : 52.4 Heparin Dosing Weight: 77kg  Vital Signs: Temp: 98.2 F (36.8 C) (11/09 0335) Temp src: Oral (11/09 0335) BP: 101/64 mmHg (11/09 0800) Pulse Rate: 61 (11/09 0800)  Labs:  Recent Labs  02/15/13 1645 02/15/13 1730 02/15/13 2115 02/16/13 0030 02/16/13 0500  HGB 12.9  --  11.9*  --  11.2*  HCT 38.1  --  35.2*  --  34.2*  PLT 187  --  186  --  202  APTT  --   --  82*  --   --   LABPROT  --   --  14.4  --   --   INR  --   --  1.14  --   --   HEPARINUNFRC  --   --   --  0.49 0.52  CREATININE 1.04  --  1.13*  --  1.18*  TROPONINI  --  5.91* 9.50* 9.13*  --     Estimated Creatinine Clearance: 55.5 ml/min (by C-G formula based on Cr of 1.18).   Medications:  Heparin @ 1050 units/hr  Assessment: 67yof continues on heparin for chest pain with positive troponins (5.9>9.5>9.1). Heparin level is therapeutic. CBC is stable. No bleeding reported. Plan for cath tomorrow 11/10.   Goal of Therapy:  Heparin level 0.3-0.7 units/ml Monitor platelets by anticoagulation protocol: Yes   Plan:  1) Continue heparin at 1050 units/hr 2) Heparin level, CBC in AM  Fredrik Rigger 02/16/2013,9:21 AM

## 2013-02-16 NOTE — Progress Notes (Signed)
  Echocardiogram 2D Echocardiogram has been performed.  Cathie Beams 02/16/2013, 2:35 PM

## 2013-02-16 NOTE — Progress Notes (Signed)
Subjective: No complaints, no chest pain + constipation  Objective: Vital signs in last 24 hours: Temp:  [97.4 F (36.3 C)-98.8 F (37.1 C)] 98.7 F (37.1 C) (11/09 0800) Pulse Rate:  [61-95] 61 (11/09 0800) Resp:  [12-16] 12 (11/09 0800) BP: (101-167)/(64-111) 101/64 mmHg (11/09 0800) SpO2:  [92 %-99 %] 96 % (11/09 0800) Weight:  [231 lb 14.4 oz (105.189 kg)-234 lb 5.6 oz (106.3 kg)] 234 lb 5.6 oz (106.3 kg) (11/08 1800) Weight change:  Last BM Date: 02/15/13 Intake/Output from previous day: -260 11/08 0701 - 11/09 0700 In: 240 [P.O.:240] Out: 500 [Urine:350; Emesis/NG output:150] Intake/Output this shift:    PE: General:Pleasant affect, NAD Skin:Warm and dry, brisk capillary refill HEENT:normocephalic, sclera clear, mucus membranes moist Neck:supple, no JVD Heart:S1S2 RRR without murmur, gallup, rub or click Lungs:clear without rales, rhonchi, or wheezes AOZ:HYQMV,HQIO, non tender, + BS, do not palpate liver spleen or masses Ext:no lower ext edema, 2+ pedal pulses, 2+ radial pulses Neuro:alert and oriented, MAE, follows commands, + facial symmetry    EKG: SR with T wave inversions in V1-V6  New from yesterday.  Lab Results:  Recent Labs  02/15/13 2115 02/16/13 0500  WBC 7.8 7.6  HGB 11.9* 11.2*  HCT 35.2* 34.2*  PLT 186 202   BMET  Recent Labs  02/15/13 2115 02/16/13 0500  NA 137 138  K 3.2* 3.7  CL 97 99  CO2 28 27  GLUCOSE 196* 230*  BUN 17 18  CREATININE 1.13* 1.18*  CALCIUM 10.4 10.0    Recent Labs  02/15/13 2115 02/16/13 0030  TROPONINI 9.50* 9.13*    Lab Results  Component Value Date   CHOL 239* 02/16/2013   HDL 58 02/16/2013   LDLCALC 154* 02/16/2013   TRIG 137 02/16/2013   CHOLHDL 4.1 02/16/2013   Lab Results  Component Value Date   HGBA1C 9.3* 02/15/2013     Lab Results  Component Value Date   TSH 4.393 02/15/2013    Hepatic Function Panel  Recent Labs  02/15/13 2115  PROT 7.2  ALBUMIN 3.6  AST 38*  ALT  14  ALKPHOS 89  BILITOT 0.3    Recent Labs  02/16/13 0500  CHOL 239*   No results found for this basename: PROTIME,  in the last 72 hours      Studies/Results: Dg Chest 2 View  02/15/2013   CLINICAL DATA:  Chest pain, shortness of breath  EXAM: CHEST  2 VIEW  COMPARISON:  CTA chest dated 12/01/2009  FINDINGS: Lungs are clear. No pleural effusion or pneumothorax.  The heart is normal in size.  Degenerative changes of the visualized thoracolumbar spine.  IMPRESSION: No evidence of acute cardiopulmonary disease.   Electronically Signed   By: Charline Bills M.D.   On: 02/15/2013 15:49    Medications: I have reviewed the patient's current medications. Scheduled Meds: . aspirin EC  81 mg Oral Daily  . atorvastatin  80 mg Oral q1800  . chlorthalidone  25 mg Oral Daily  . [START ON 02/17/2013] influenza vac split quadrivalent PF  0.5 mL Intramuscular Tomorrow-1000  . insulin aspart  0-5 Units Subcutaneous QHS  . insulin aspart  0-9 Units Subcutaneous TID WC  . losartan  100 mg Oral Daily  . metoprolol tartrate  12.5 mg Oral BID   Continuous Infusions: . heparin 1,050 Units/hr (02/15/13 1818)   PRN Meds:.acetaminophen, HYDROcodone-acetaminophen, nitroGLYCERIN, ondansetron (ZOFRAN) IV  Assessment/Plan: Principal Problem:   NSTEMI (non-ST elevated  myocardial infarction) Active Problems:   Hypertension   Dyslipidemia   T2DM (type 2 diabetes mellitus)   PLAN:Troponin now coming down.  EKG with evolving changes of ant lat MI SSI added to meds. Plan for cath tomorrow Treat for constipation.  LOS: 1 day   Time spent with pt. :25 minutes. [facility]  Nurse Practitioner Certified Pager [phone] 02/16/2013, 10:14 AM    Patient seen and examined. Agree with assessment and plan. No recurrent chest pain. Pt had experienced symptoms for ~ 2 weeks, for which she attributed to GI discomfort. Positive NSTEMI with ECG evolving T wave abnormalities anterolaterally suspicious for  LAD disease. Plan cath with poss PCI tomorrow. Titrate medical therapy. Now on atorvastation 80 mg for elevated lipids. Will add nitrates. HR 59 on metoprolol.    Lennette Bihari, MD, [facility] 02/16/2013 10:35 AM

## 2013-02-16 NOTE — Progress Notes (Signed)
ANTICOAGULATION CONSULT NOTE - Follow Up Consult  Pharmacy Consult for heparin Indication: NSTEMI  Labs:  Recent Labs  02/15/13 1645 02/15/13 1730 02/15/13 2115 02/16/13 0030  HGB 12.9  --  11.9*  --   HCT 38.1  --  35.2*  --   PLT 187  --  186  --   APTT  --   --  82*  --   LABPROT  --   --  14.4  --   INR  --   --  1.14  --   HEPARINUNFRC  --   --   --  0.49  CREATININE 1.04  --  1.13*  --   TROPONINI  --  5.91* 9.50* 9.13*    Assessment/Plan:  67yo female therapeutic on heparin with initial dosing for NSTEMI.  Will continue gtt at current rate and confirm stable with am labs.  Vernard Gambles, PharmD, BCPS  02/16/2013,2:44 AM

## 2013-02-17 ENCOUNTER — Encounter (HOSPITAL_COMMUNITY)
Admission: EM | Disposition: A | Discharge status: Home / Self Care | Payer: PRIVATE HEALTH INSURANCE | Source: Home / Self Care | Attending: Cardiovascular Disease

## 2013-02-17 DIAGNOSIS — I214 Non-ST elevation (NSTEMI) myocardial infarction: Principal | ICD-10-CM

## 2013-02-17 DIAGNOSIS — I2510 Atherosclerotic heart disease of native coronary artery without angina pectoris: Secondary | ICD-10-CM

## 2013-02-17 HISTORY — PX: LEFT HEART CATHETERIZATION WITH CORONARY ANGIOGRAM: SHX5451

## 2013-02-17 LAB — BASIC METABOLIC PANEL
CO2: 28 mEq/L (ref 19–32)
Calcium: 9.2 mg/dL (ref 8.4–10.5)
Creatinine, Ser: 1.27 mg/dL — ABNORMAL HIGH (ref 0.50–1.10)
GFR calc Af Amer: 50 mL/min — ABNORMAL LOW (ref >90)
GFR calc non Af Amer: 43 mL/min — ABNORMAL LOW (ref >90)
Sodium: 140 mEq/L (ref 135–145)

## 2013-02-17 LAB — CBC
Hemoglobin: 10.8 g/dL — ABNORMAL LOW (ref 12.0–15.0)
MCH: 30.4 pg (ref 26.0–34.0)
MCHC: 33.1 g/dL (ref 30.0–36.0)
MCV: 91.8 fL (ref 78.0–100.0)
Platelets: 173 10*3/uL (ref 150–400)
RBC: 3.55 MIL/uL — ABNORMAL LOW (ref 3.87–5.11)

## 2013-02-17 LAB — HEPARIN LEVEL (UNFRACTIONATED): Heparin Unfractionated: 0.49 IU/mL (ref 0.30–0.70)

## 2013-02-17 LAB — GLUCOSE, CAPILLARY
Glucose-Capillary: 143 mg/dL — ABNORMAL HIGH (ref 70–99)
Glucose-Capillary: 142 mg/dL — ABNORMAL HIGH (ref 70–99)
Glucose-Capillary: 142 mg/dL — ABNORMAL HIGH (ref 70–99)
Glucose-Capillary: 353 mg/dL — ABNORMAL HIGH (ref 70–99)

## 2013-02-17 LAB — PROTIME-INR
INR: 1.15 (ref 0.00–1.49)
Prothrombin Time: 14.5 seconds (ref 11.6–15.2)

## 2013-02-17 LAB — TROPONIN I: Troponin I: 6.08 ng/mL (ref <0.30)

## 2013-02-17 SURGERY — LEFT HEART CATHETERIZATION WITH CORONARY ANGIOGRAM
Anesthesia: LOCAL

## 2013-02-17 MED ORDER — LIDOCAINE HCL (PF) 1 % IJ SOLN
INTRAMUSCULAR | Status: AC
  Filled 2013-02-17: qty 30

## 2013-02-17 MED ORDER — NITROGLYCERIN 0.2 MG/ML ON CALL CATH LAB
INTRAVENOUS | Status: AC
  Filled 2013-02-17: qty 1

## 2013-02-17 MED ORDER — VERAPAMIL HCL 2.5 MG/ML IV SOLN
INTRAVENOUS | Status: AC
  Filled 2013-02-17: qty 2

## 2013-02-17 MED ORDER — FENTANYL CITRATE 0.05 MG/ML IJ SOLN
INTRAMUSCULAR | Status: AC
  Filled 2013-02-17: qty 2

## 2013-02-17 MED ORDER — SODIUM CHLORIDE 0.9 % IV SOLN: 250.0000 mL | INTRAVENOUS | Status: DC | PRN

## 2013-02-17 MED ORDER — SODIUM CHLORIDE 0.9 % IV SOLN: 1.0000 mL/kg/h | INTRAVENOUS | Status: AC

## 2013-02-17 MED ORDER — TICAGRELOR 90 MG PO TABS
ORAL_TABLET | ORAL | Status: AC
  Filled 2013-02-17: qty 1

## 2013-02-17 MED ORDER — MORPHINE SULFATE 2 MG/ML IJ SOLN: 2.0000 mg | INTRAMUSCULAR | Status: DC | PRN

## 2013-02-17 MED ORDER — MIDAZOLAM HCL 2 MG/2ML IJ SOLN
INTRAMUSCULAR | Status: AC
  Filled 2013-02-17: qty 2

## 2013-02-17 MED ORDER — PREDNISONE 10 MG PO TABS
60.0000 mg | ORAL_TABLET | Freq: Four times a day (QID) | ORAL | Status: AC
  Administered 2013-02-17 – 2013-02-18 (×2): 60 mg via ORAL
  Filled 2013-02-17 (×6): qty 1

## 2013-02-17 MED ORDER — SODIUM CHLORIDE 0.9 % IJ SOLN: 3.0000 mL | INTRAMUSCULAR | Status: DC | PRN

## 2013-02-17 MED ORDER — SODIUM CHLORIDE 0.9 % IJ SOLN: 3.0000 mL | Freq: Two times a day (BID) | INTRAMUSCULAR | Status: DC

## 2013-02-17 MED ORDER — PANTOPRAZOLE SODIUM 40 MG PO TBEC
40.0000 mg | DELAYED_RELEASE_TABLET | Freq: Every day | ORAL | Status: DC
  Administered 2013-02-17 – 2013-02-18 (×2): 40 mg via ORAL
  Filled 2013-02-17 (×2): qty 1

## 2013-02-17 MED ORDER — FUROSEMIDE 10 MG/ML IJ SOLN: 20.0000 mg | Freq: Once | INTRAMUSCULAR | Status: DC

## 2013-02-17 MED ORDER — TICAGRELOR 90 MG PO TABS
90.0000 mg | ORAL_TABLET | Freq: Two times a day (BID) | ORAL | Status: DC
  Administered 2013-02-17 – 2013-02-18 (×2): 90 mg via ORAL
  Filled 2013-02-17 (×3): qty 1

## 2013-02-17 MED ORDER — DIPHENHYDRAMINE HCL 25 MG PO CAPS
25.0000 mg | ORAL_CAPSULE | Freq: Four times a day (QID) | ORAL | Status: AC
  Administered 2013-02-17 – 2013-02-18 (×2): 25 mg via ORAL
  Filled 2013-02-17 (×2): qty 1

## 2013-02-17 MED ORDER — HEPARIN (PORCINE) IN NACL 2-0.9 UNIT/ML-% IJ SOLN
INTRAMUSCULAR | Status: AC
  Filled 2013-02-17: qty 1000

## 2013-02-17 MED ORDER — BIVALIRUDIN 250 MG IV SOLR
INTRAVENOUS | Status: AC
  Filled 2013-02-17: qty 250

## 2013-02-17 NOTE — Care Management Note (Addendum)
  Page 1 of 1   02/18/2013     11:22:11 AM   CARE MANAGEMENT NOTE 02/18/2013  Patient:  JOSEPHA, BARBIER   Account Number:  [id number]  Date Initiated:  02/17/2013  Documentation initiated by:  Junius Creamer  Subjective/Objective Assessment:   adm w mi     Action/Plan:   lives w fam, pcp dr Adrian Saran bland   Anticipated DC Date:  02/18/2013   Anticipated DC Plan:  HOME/SELF CARE      DC Planning Services  CM consult      Choice offered to / List presented to:             Status of service:   Medicare Important Message given?   (If response is "NO", the following Medicare IM given date fields will be blank) Date Medicare IM given:   Date Additional Medicare IM given:    Discharge Disposition:    Per UR Regulation:  Reviewed for med. necessity/level of care/duration of stay  If discussed at Long Length of Stay Meetings, dates discussed:    Comments:  02/18/13 1030 Abimelec Grochowski, RN, BSN, [employer] [phone] Spoke with pt at bedside regarding benefits check for Brilinta 90mg  BID.  Pt has brochure with 30 day free card and refill assistance card intact.  Pt utilizes [facility] on [address] for prescription needs.  NCM called pharmacy to confirm availability of medication. Pt co-pay will be $3.60.   Information relayed to pt.  Pt verbalizes importance of filling medication upon discharge.

## 2013-02-17 NOTE — Interval H&P Note (Signed)
History and Physical Interval Note:  02/17/2013 11:37 AM  Phyllis Conner  has presented today for surgery, with the diagnosis of NSTEMI  The various methods of treatment have been discussed with the patient and family. After consideration of risks, benefits and other options for treatment, the patient has consented to  Procedure(s): LEFT HEART CATHETERIZATION WITH CORONARY ANGIOGRAM (N/A) +/- PCI as a surgical intervention .  The patient's history has been reviewed, patient examined, no change in status, stable for surgery.  I have reviewed the patient's chart and labs.  Questions were answered to the patient's satisfaction.     [place] [facility] W   Cath Lab Visit (complete for each Cath Lab visit)  Clinical Evaluation Leading to the Procedure:   ACS: yes  Non-ACS:    Anginal Classification: CCS IV  Anti-ischemic medical therapy: Maximal Therapy (2 or more classes of medications)  Non-Invasive Test Results: No non-invasive testing performed  Prior CABG: No previous CABG

## 2013-02-17 NOTE — CV Procedure (Signed)
CARDIAC CATHETERIZATION AND PERCUTANEOUS CORONARY INTERVENTION REPORT  NAME:  KELSHA OLDER   MRN: [id number] DOB:  October 16, 1945   ADMIT DATE: 02/15/2013 Procedure Date: 02/17/2013  INTERVENTIONAL CARDIOLOGIST: Marykay Lex, M.D., MS PRIMARY CARE PROVIDER: Geraldo Pitter, MD PRIMARY CARDIOLOGIST: Lennette Bihari, MD  PATIENT:  Phyllis Conner is a 67 y.o. female HTN/HLD & DM-2 & prior PE admitted with NSTEMI.  Over past ~2 wks, has noted intermittent CP/pressure --> to both arms. Longest lasting ~1 hr, which brought her to the ER.  ECG with Anteroseptal Q waves. Troponin + c/w NSTEMI. Seen & by Dr. Bishop Limbo, who recommended cardiac catheterization.  PRE-OPERATIVE DIAGNOSIS:    NSTEMI  PROCEDURES PERFORMED:    LEFT HEART CATHETERIZATION WITH CORONARY ANGIOGRAPHY  PERCUTANEOUS CORONARY INTERVENTION ON MID LAD 95-99% STENOSIS WITH A PROMUS PREMIER DES 2.5 MM x 12 MM (2.72 MM)  PERCUTANEOUS CORONARY INTERVENTION ON MID OM1 80-90% STENOSIS WITH A PROMUS PREMIER DES 2.75 MM x 16 MM (2.85 MM)  PROCEDURE:Consent:  Risks of procedure as well as the alternatives and risks of each were explained to the (patient/caregiver).  Consent for procedure obtained. Consent for signed by MD and patient with RN witness -- placed on chart.   PROCEDURE: The patient was brought to the 2nd Floor [facility] in the fasting state and prepped and draped in the usual sterile fashion for Right groin or radial access. A modified Allen's test with plethysmography was performed, revealing excellent Ulnar artery collateral flow.  Sterile technique was used including antiseptics, cap, gloves, gown, hand hygiene, mask and sheet.  Skin prep: Chlorhexidine.  Time Out: Verified patient identification, verified procedure, site/side was marked, verified correct patient position, special equipment/implants available, medications/allergies/relevent history reviewed, required imaging and test results  available.  Performed  Access:  After initial attempts to access the Right Radial Artery were unsuccessful, attention was turned to the Right Common Femoral Artery for access  Right Common Femoral Artery; 5 Fr Sheath -- fluoroscopically guided modified Seldinger technique  Diagnostic: 5 Fr JL4, JR 4, Angled Pigtail catheter is advanced and exchanged over standard J-wire.  Left Coronary Artery Angiography: JL4  Right Coronary Artery Angiography:  JR 4  LV Hemodynamics: JR 4  Sheath:  Sutured in place to be removed with direct manual pressure being held for hemostasis. A brief common femoral angiogram revealed a very high femoral bifurcation was accessed right at the bifurcation point.  MEDICATIONS:  Anesthesia:  Local Lidocaine 18 ml  Sedation:  4 mg IV Versed, 125 mcg IV fentanyl ;   Premedication: 5 mg oral Valium; 125 mg IV Solu-Medrol, 25 mg IV Benadryl, 20 mg IV Pepcid (history of contrast reaction)  Omnipaque Contrast: 175 ml  Anticoagulation:  Angiomax Bolus & drip  Anti-Platelet Agent:  Brilinta 180 mg  Hemodynamics:  Central Aortic / Mean Pressures: 119/76 mmHg; 94 mmHg  Left Ventricular Pressures / EDP: 114/15 mmHg; 23 mmHg  Left Ventriculography: Not performed, recent echo performed  Coronary Anatomy:  Left Main: Large-caliber vessels, bifurcates into the LAD And Circumflex; angiographically normal. LAD: Moderate to large caliber vessel that wraps the apex. There are several septal perforators and a early mid vessel D1 that has ostial 60% stenosis. This is a brace small-caliber vessel. Beyond D1 in between 2 septal perforators there is a focal 95-90% mid LAD stenosis  Left Circumflex: Large-caliber vessel that bifurcates proximally into a high first obtuse marginal branch and the AV groove circumflex is gives rise to  2 small arteries marginal branches and a small posterior lateral system. The proximal vessel has diffuse 20-30% stenoses after it gives off OM1. In  the AV groove there is mild luminal irregularities but nothing significant throughout the remainder of the main Circumflex.  OM1: Moderate to large caliber major branch of the circumflex that courses almost is a Ramus Intermedius. Just after the first major band there is a focal, "apple core "lesion is roughly 80-90%. The remainder the vessel is relatively free of disease and bifurcates distally.   RCA: Moderate caliber, dominant vessel because of a small posterior lateral branch with maybe nodal artery. The main RCA takes 2-3 major bends as it courses distally to terminate as the Right Posterior Descending Artery (RPDA). There is a small Right Posterior AV Groove Branch (RPAV) that gives off several small posterolateral branches as well as AV nodal artery. Mild luminal irregularities noted.  After reviewing the initial angiography to potential culprits were revealed. Based on ECG changes and the echocardiogram findings, the LAD lesion would probably be the most likely culprit as it is also the most severely stenotic. However the Circumflex lesion is also severely stenotic. Therefore the decision was made to proceed with PCI of both of these lesions and the relatively focal.  Preparations were then made to proceed with PCI.   Percutaneous Coronary Intervention:  Sheath exchanged for 6 Fr  Angiomax bolus was administered and the drip run during the PCI. 180 mg of Brilinta was administered orally.  Guide: 6 Fr   XB LAD 3.5  Guidewire: Pro-water   Lesion #1: Mid LAD 95-99% --> reduced to 0%; TIMI 2 flow pre-, TIMI-3 flow post  Predilation Balloon: Mini Trek 2.0  mm x 12  mm;   8 Atm x 30  Sec x 2 Stent: Promus Premier DES 2.5  mm x 12  mm;   Deployed at: 12  Atm x 30  Sec,   Post-Dilation with Stent Balloon: 16  Atm x 45  Sec --> final diameter 2.72 mm   Post deployment angiography in multiple views, with and without guidewire in place revealed excellent stent deployment and lesion coverage.   There was no evidence of dissection or perforation.   Lesion #2: Mid Circumflex 80-90 % --> reduced to 0%; TIMI 3 flow pre-, TIMI-3 flow post  Predilation Balloon:Mini Trek 2.0  mm x 12  mm;   8 Atm x 30  Sec,  Stent: Promus Premier DES 2.75 mm x 16 mm  Deployed at   14 Atm x 45  Sec --> final diameter: 2.85 mm   Post deployment angiography in multiple views, with and without guidewire in place revealed excellent stent deployment and lesion coverage.  There was no evidence of dissection or perforation.  PATIENT DISPOSITION:    The patient was transferred to the PACU holding area in a hemodynamicaly stable, chest pain free condition.  The patient tolerated the procedure well, and there were no complications.  EBL:   < 10  ml  The patient was stable before, during, and after the procedure.  POST-OPERATIVE DIAGNOSIS:    Severe 2 vessel CAD with mid LAD and mid OM1 lesions. Most likely culprit lesion was the mid LAD.    Successful 2 vessel PCI on the mid LAD and mid OM 1 with Promus Premier DES stents.  Mildly elevated LVEDP, with mildly reduced EF by echocardiogram. There was suggestion of LAD wall motion abnormality.   PLAN OF CARE:  Transfer to post procedure  unit, 6 Central for post catheterization care.  Dual antiplatelet therapy for minimal in year. Continue to optimize medical therapy for CAD.  Anticipate discharge in the morning if stable.   Marykay Lex, M.D., M.S. [facility] [address] [place], [place]  [address]  [phone]  02/17/2013 3:53 PM

## 2013-02-17 NOTE — H&P (View-Only) (Signed)
67 Y/O woman with HTN/HLD & DM-2 & prior PE p/w NSTEMI. Over past ~2 wks, has noted intermittent CP/pressure --> to both arms.  Longest lasting ~1 hr, which brought her to the ER. ECG with Anteroseptal Q waves.  Troponin + c/w NSTEMI.  Subjective: No complaints. Denies CP/SOB.   Objective: Vital signs in last 24 hours: Temp:  [98.6 F (37 C)-99.2 F (37.3 C)] 99.1 F (37.3 C) (11/10 0825) Pulse Rate:  [66-95] 69 (11/10 0825) Resp:  [12-21] 16 (11/10 0400) BP: (91-122)/(49-72) 111/58 mmHg (11/10 0825) SpO2:  [96 %-100 %] 99 % (11/10 0825) Last BM Date: 02/16/13  Intake/Output from previous day: 11/09 0701 - 11/10 0700 In: 1085.8 [P.O.:360; I.V.:725.8] Out: 900 [Urine:900] Intake/Output this shift: Total I/O In: 116.5 [I.V.:116.5] Out: -   Medications Current Facility-Administered Medications  Medication Dose Route Frequency Provider Last Rate Last Dose  . 0.9 %  sodium chloride infusion  250 mL Intravenous PRN Nada Boozer, NP      . 0.9 %  sodium chloride infusion  1 mL/kg/hr Intravenous Continuous Nada Boozer, NP 106.3 mL/hr at 02/17/13 0700 1 mL/kg/hr at 02/17/13 0700  . acetaminophen (TYLENOL) tablet 650 mg  650 mg Oral Q4H PRN Leeann Must, MD   650 mg at 02/16/13 2057  . [START ON 02/18/2013] aspirin EC tablet 81 mg  81 mg Oral Daily Lennette Bihari, MD      . atorvastatin (LIPITOR) tablet 80 mg  80 mg Oral q1800 Leeann Must, MD   80 mg at 02/16/13 1719  . chlorthalidone (HYGROTON) tablet 25 mg  25 mg Oral Daily Leeann Must, MD   25 mg at 02/16/13 0951  . diazepam (VALIUM) tablet 5 mg  5 mg Oral On Call Nada Boozer, NP      . diphenhydrAMINE (BENADRYL) injection 25 mg  25 mg Intravenous Pre-Cath Nada Boozer, NP      . docusate sodium (COLACE) capsule 100 mg  100 mg Oral BID Nada Boozer, NP   100 mg at 02/16/13 2225  . famotidine (PEPCID) IVPB 20 mg  20 mg Intravenous Pre-Cath Nada Boozer, NP      . heparin ADULT infusion 100 units/mL (25000 units/250 mL)  1,050  Units/hr Intravenous Continuous Hilario Quarry Amend, RPH 10.5 mL/hr at 02/17/13 0700 1,050 Units/hr at 02/17/13 0700  . HYDROcodone-acetaminophen (NORCO) 7.5-325 MG per tablet 1 tablet  1 tablet Oral BID PRN Leeann Must, MD      . influenza vac split quadrivalent PF (FLUARIX) injection 0.5 mL  0.5 mL Intramuscular Tomorrow-1000 Lennette Bihari, MD      . insulin aspart (novoLOG) injection 0-5 Units  0-5 Units Subcutaneous QHS Nada Boozer, NP   3 Units at 02/16/13 2225  . insulin aspart (novoLOG) injection 0-9 Units  0-9 Units Subcutaneous TID WC Nada Boozer, NP   1 Units at 02/16/13 1720  . isosorbide mononitrate (IMDUR) 24 hr tablet 30 mg  30 mg Oral Daily Lennette Bihari, MD   30 mg at 02/16/13 1254  . losartan (COZAAR) tablet 100 mg  100 mg Oral Daily Leeann Must, MD   100 mg at 02/16/13 0951  . methylPREDNISolone sodium succinate (SOLU-MEDROL) 125 mg/2 mL injection 125 mg  125 mg Intravenous Pre-Cath Nada Boozer, NP      . metoprolol tartrate (LOPRESSOR) tablet 12.5 mg  12.5 mg Oral BID Leeann Must, MD   12.5 mg at 02/16/13 2242  . morphine 2 MG/ML injection 2 mg  2 mg Intravenous Q4H  PRN Rollene Rotunda, MD      . nitroGLYCERIN (NITROSTAT) SL tablet 0.4 mg  0.4 mg Sublingual Q5 Min x 3 PRN Leeann Must, MD   0.4 mg at 02/16/13 2244  . ondansetron (ZOFRAN) injection 4 mg  4 mg Intravenous Q6H PRN Leeann Must, MD   4 mg at 02/15/13 2227  . polyethylene glycol (MIRALAX / GLYCOLAX) packet 17 g  17 g Oral Daily Nada Boozer, NP   17 g at 02/16/13 1254  . sodium chloride 0.9 % injection 3 mL  3 mL Intravenous Q12H Nada Boozer, NP      . sodium chloride 0.9 % injection 3 mL  3 mL Intravenous PRN Nada Boozer, NP        PE: General appearance: alert, cooperative and no distress Lungs: clear to auscultation bilaterally Heart: regular rate and rhythm and 1/6 murmur Extremities: no LEE Pulses: 2+ and symmetric Skin: warm and dry Neurologic: Grossly normal  Lab Results:   Recent Labs   02/15/13 2115 02/16/13 0500 02/17/13 0505  WBC 7.8 7.6 6.7  HGB 11.9* 11.2* 10.8*  HCT 35.2* 34.2* 32.6*  PLT 186 202 173   BMET  Recent Labs  02/15/13 2115 02/16/13 0500 02/17/13 0505  NA 137 138 140  K 3.2* 3.7 3.6  CL 97 99 103  CO2 28 27 28   GLUCOSE 196* 230* 163*  BUN 17 18 25*  CREATININE 1.13* 1.18* 1.27*  CALCIUM 10.4 10.0 9.2   PT/INR  Recent Labs  02/15/13 2115 02/17/13 0505  LABPROT 14.4 14.5  INR 1.14 1.15   Cholesterol  Recent Labs  02/16/13 0500  CHOL 239*   Cardiac Panel (last 3 results)  Recent Labs  02/15/13 2115 02/16/13 0030 02/17/13 0505  TROPONINI 9.50* 9.13* 6.08*    Assessment/Plan  Principal Problem:   NSTEMI (non-ST elevated myocardial infarction) Active Problems:   Hypertension   Dyslipidemia   T2DM (type 2 diabetes mellitus)  Plan: NSTEMI. Initial troponin level at 9.50, trending downward. 6.08 today. CP free on IV heparin and IV NTG. BP and HR both stable. Plan for diagnostic LHC today. INR is WNL. SCr slightly elevated at 1.27. Will hydrate with IVFs post cath. EF 45-50% on echo.     LOS: 2 days    Brittainy M. Delmer Islam 02/17/2013 8:36 AM  I have seen and evaluated the patient this AM along with Boyce Medici, PA. I agree with her findings, examination as well as impression recommendations.  67 y/o woman with Cardiac RFs p/w NSTEMI.  Currently CP free on IV Heparin & NTG. Non-specific ST-T changes on ECG (not Ant-septal Qs)  She did have some CP last PM - better with NTG.  Plan LHC +/- PCI today - but has h/o Contrast Allergy -- will premedicate with steroids & H1-2 blockers.  Marykay Lex, M.D., M.S. [facility] [address] [place], [place]  [address]  [phone] Pager # [phone] 02/17/2013 9:56 AM

## 2013-02-17 NOTE — Progress Notes (Signed)
Site area: right groin  Site Prior to Removal:  Level 0  Pressure Applied For 20 MINUTES    Minutes Beginning at 1725  Manual:   yes  Patient Status During Pull:  stable  Post Pull Groin Site:  Level 0  Post Pull Instructions Given:  yes  Post Pull Pulses Present:  yes  Dressing Applied:  yes  Comments:  Remains free from injury, verbalized understanding of post sheath pull instructions.

## 2013-02-17 NOTE — Progress Notes (Signed)
ANTICOAGULATION CONSULT NOTE - Follow Up Consult  Pharmacy Consult for Heparin Indication: chest pain/ACS  Allergies  Allergen Reactions  . Iohexol Hives and Other (See Comments)    Excessive sweating    Patient Measurements: Height: 5\' 3"  (160 cm) Weight: 234 lb 5.6 oz (106.3 kg) IBW/kg (Calculated) : 52.4 Heparin Dosing Weight: 77kg  Vital Signs: Temp: 98.7 F (37.1 C) (11/10 1221) Temp src: Oral (11/10 1221) BP: 96/49 mmHg (11/10 1200) Pulse Rate: 72 (11/10 1221)  Labs:  Recent Labs  02/15/13 1645  02/15/13 2115 02/16/13 0030 02/16/13 0500 02/17/13 0505  HGB 12.9  --  11.9*  --  11.2* 10.8*  HCT 38.1  --  35.2*  --  34.2* 32.6*  PLT 187  --  186  --  202 173  APTT  --   --  82*  --   --   --   LABPROT  --   --  14.4  --   --  14.5  INR  --   --  1.14  --   --  1.15  HEPARINUNFRC  --   --   --  0.49 0.52 0.49  CREATININE 1.04  --  1.13*  --  1.18* 1.27*  TROPONINI  --   < > 9.50* 9.13*  --  6.08*  < > = values in this interval not displayed.  Estimated Creatinine Clearance: 51.6 ml/min (by C-G formula based on Cr of 1.27).   Medications:  Heparin @ 1050 units/hr  67 yo F admitted 02/15/2013  with CP. Pharmacy consulted to dose heparin  Events: no CP, on NTG, for cath today  PMH: DM, HTN, HLD, prior PE (no AC pta)  Anticoagulation: Hep at goal, follow up after cath.  Cardiovascular: CAD  aspirin, lipitor (LDL 154), chlorthalidone, losartan, metoprolol,  VSS - + troponin, EF 45-50%, BNP elevated 4732   Goal of Therapy:  Heparin level 0.3-0.7 units/ml Monitor platelets by anticoagulation protocol: Yes   Plan:  1) Continue heparin at 1050 units/hr 2) Follow up after cath, Heparin level, CBC daily if continues   Thank you for allowing pharmacy to be a part of this patients care team.  Lovenia Kim Pharm.D., BCPS Clinical Pharmacist 02/17/2013 1:43 PM Pager: [phone] Phone: [phone]

## 2013-02-17 NOTE — Progress Notes (Signed)
 67 Y/O woman with HTN/HLD & DM-2 & prior PE p/w NSTEMI. Over past ~2 wks, has noted intermittent CP/pressure --> to both arms.  Longest lasting ~1 hr, which brought her to the ER. ECG with Anteroseptal Q waves.  Troponin + c/w NSTEMI.  Subjective: No complaints. Denies CP/SOB.   Objective: Vital signs in last 24 hours: Temp:  [98.6 F (37 C)-99.2 F (37.3 C)] 99.1 F (37.3 C) (11/10 0825) Pulse Rate:  [66-95] 69 (11/10 0825) Resp:  [12-21] 16 (11/10 0400) BP: (91-122)/(49-72) 111/58 mmHg (11/10 0825) SpO2:  [96 %-100 %] 99 % (11/10 0825) Last BM Date: 02/16/13  Intake/Output from previous day: 11/09 0701 - 11/10 0700 In: 1085.8 [P.O.:360; I.V.:725.8] Out: 900 [Urine:900] Intake/Output this shift: Total I/O In: 116.5 [I.V.:116.5] Out: -   Medications Current Facility-Administered Medications  Medication Dose Route Frequency Provider Last Rate Last Dose  . 0.9 %  sodium chloride infusion  250 mL Intravenous PRN Laura Ingold, NP      . 0.9 %  sodium chloride infusion  1 mL/kg/hr Intravenous Continuous Laura Ingold, NP 106.3 mL/hr at 02/17/13 0700 1 mL/kg/hr at 02/17/13 0700  . acetaminophen (TYLENOL) tablet 650 mg  650 mg Oral Q4H PRN Jacob Kelly, MD   650 mg at 02/16/13 2057  . [START ON 02/18/2013] aspirin EC tablet 81 mg  81 mg Oral Daily Thomas A Kelly, MD      . atorvastatin (LIPITOR) tablet 80 mg  80 mg Oral q1800 Jacob Kelly, MD   80 mg at 02/16/13 1719  . chlorthalidone (HYGROTON) tablet 25 mg  25 mg Oral Daily Jacob Kelly, MD   25 mg at 02/16/13 0951  . diazepam (VALIUM) tablet 5 mg  5 mg Oral On Call Laura Ingold, NP      . diphenhydrAMINE (BENADRYL) injection 25 mg  25 mg Intravenous Pre-Cath Laura Ingold, NP      . docusate sodium (COLACE) capsule 100 mg  100 mg Oral BID Laura Ingold, NP   100 mg at 02/16/13 2225  . famotidine (PEPCID) IVPB 20 mg  20 mg Intravenous Pre-Cath Laura Ingold, NP      . heparin ADULT infusion 100 units/mL (25000 units/250 mL)  1,050  Units/hr Intravenous Continuous Caron George Amend, RPH 10.5 mL/hr at 02/17/13 0700 1,050 Units/hr at 02/17/13 0700  . HYDROcodone-acetaminophen (NORCO) 7.5-325 MG per tablet 1 tablet  1 tablet Oral BID PRN Jacob Kelly, MD      . influenza vac split quadrivalent PF (FLUARIX) injection 0.5 mL  0.5 mL Intramuscular Tomorrow-1000 Thomas A Kelly, MD      . insulin aspart (novoLOG) injection 0-5 Units  0-5 Units Subcutaneous QHS Laura Ingold, NP   3 Units at 02/16/13 2225  . insulin aspart (novoLOG) injection 0-9 Units  0-9 Units Subcutaneous TID WC Laura Ingold, NP   1 Units at 02/16/13 1720  . isosorbide mononitrate (IMDUR) 24 hr tablet 30 mg  30 mg Oral Daily Thomas A Kelly, MD   30 mg at 02/16/13 1254  . losartan (COZAAR) tablet 100 mg  100 mg Oral Daily Jacob Kelly, MD   100 mg at 02/16/13 0951  . methylPREDNISolone sodium succinate (SOLU-MEDROL) 125 mg/2 mL injection 125 mg  125 mg Intravenous Pre-Cath Laura Ingold, NP      . metoprolol tartrate (LOPRESSOR) tablet 12.5 mg  12.5 mg Oral BID Jacob Kelly, MD   12.5 mg at 02/16/13 2242  . morphine 2 MG/ML injection 2 mg  2 mg Intravenous Q4H   PRN James Hochrein, MD      . nitroGLYCERIN (NITROSTAT) SL tablet 0.4 mg  0.4 mg Sublingual Q5 Min x 3 PRN Jacob Kelly, MD   0.4 mg at 02/16/13 2244  . ondansetron (ZOFRAN) injection 4 mg  4 mg Intravenous Q6H PRN Jacob Kelly, MD   4 mg at 02/15/13 2227  . polyethylene glycol (MIRALAX / GLYCOLAX) packet 17 g  17 g Oral Daily Laura Ingold, NP   17 g at 02/16/13 1254  . sodium chloride 0.9 % injection 3 mL  3 mL Intravenous Q12H Laura Ingold, NP      . sodium chloride 0.9 % injection 3 mL  3 mL Intravenous PRN Laura Ingold, NP        PE: General appearance: alert, cooperative and no distress Lungs: clear to auscultation bilaterally Heart: regular rate and rhythm and 1/6 murmur Extremities: no LEE Pulses: 2+ and symmetric Skin: warm and dry Neurologic: Grossly normal  Lab Results:   Recent Labs   02/15/13 2115 02/16/13 0500 02/17/13 0505  WBC 7.8 7.6 6.7  HGB 11.9* 11.2* 10.8*  HCT 35.2* 34.2* 32.6*  PLT 186 202 173   BMET  Recent Labs  02/15/13 2115 02/16/13 0500 02/17/13 0505  NA 137 138 140  K 3.2* 3.7 3.6  CL 97 99 103  CO2 28 27 28  GLUCOSE 196* 230* 163*  BUN 17 18 25*  CREATININE 1.13* 1.18* 1.27*  CALCIUM 10.4 10.0 9.2   PT/INR  Recent Labs  02/15/13 2115 02/17/13 0505  LABPROT 14.4 14.5  INR 1.14 1.15   Cholesterol  Recent Labs  02/16/13 0500  CHOL 239*   Cardiac Panel (last 3 results)  Recent Labs  02/15/13 2115 02/16/13 0030 02/17/13 0505  TROPONINI 9.50* 9.13* 6.08*    Assessment/Plan  Principal Problem:   NSTEMI (non-ST elevated myocardial infarction) Active Problems:   Hypertension   Dyslipidemia   T2DM (type 2 diabetes mellitus)  Plan: NSTEMI. Initial troponin level at 9.50, trending downward. 6.08 today. CP free on IV heparin and IV NTG. BP and HR both stable. Plan for diagnostic LHC today. INR is WNL. SCr slightly elevated at 1.27. Will hydrate with IVFs post cath. EF 45-50% on echo.     LOS: 2 days    Phyllis M. Simmons, PA-C 02/17/2013 8:36 AM  I have seen and evaluated the patient this AM along with Brittany Simmons, PA. I agree with her findings, examination as well as impression recommendations.  67 y/o woman with Cardiac RFs p/w NSTEMI.  Currently CP free on IV Heparin & NTG. Non-specific ST-T changes on ECG (not Ant-septal Qs)  She did have some CP last PM - better with NTG.  Plan LHC +/- PCI today - but has h/o Contrast Allergy -- will premedicate with steroids & H1-2 blockers.  HARDING,DAVID W, M.D., M.S. [facility] [address] [place], [place]  [address]  [phone] Pager # [phone] 02/17/2013 9:56 AM      

## 2013-02-18 DIAGNOSIS — Z9861 Coronary angioplasty status: Secondary | ICD-10-CM

## 2013-02-18 DIAGNOSIS — I2699 Other pulmonary embolism without acute cor pulmonale: Secondary | ICD-10-CM | POA: Diagnosis present

## 2013-02-18 DIAGNOSIS — I2510 Atherosclerotic heart disease of native coronary artery without angina pectoris: Secondary | ICD-10-CM | POA: Diagnosis present

## 2013-02-18 LAB — CBC
HCT: 32.2 % — ABNORMAL LOW (ref 36.0–46.0)
Hemoglobin: 10.7 g/dL — ABNORMAL LOW (ref 12.0–15.0)
MCH: 29.7 pg (ref 26.0–34.0)
MCV: 89.4 fL (ref 78.0–100.0)
RBC: 3.6 MIL/uL — ABNORMAL LOW (ref 3.87–5.11)

## 2013-02-18 LAB — BASIC METABOLIC PANEL
BUN: 20 mg/dL (ref 6–23)
CO2: 22 mEq/L (ref 19–32)
Calcium: 8.9 mg/dL (ref 8.4–10.5)
Creatinine, Ser: 0.97 mg/dL (ref 0.50–1.10)
GFR calc Af Amer: 70 mL/min — ABNORMAL LOW (ref >90)
Glucose, Bld: 278 mg/dL — ABNORMAL HIGH (ref 70–99)
Sodium: 136 mEq/L (ref 135–145)

## 2013-02-18 LAB — GLUCOSE, CAPILLARY: Glucose-Capillary: 262 mg/dL — ABNORMAL HIGH (ref 70–99)

## 2013-02-18 MED ORDER — METFORMIN HCL 500 MG PO TABS: 500.0000 mg | ORAL_TABLET | Freq: Every day | ORAL | Status: DC

## 2013-02-18 MED ORDER — ACETAMINOPHEN 325 MG PO TABS: 650.0000 mg | ORAL_TABLET | ORAL | Status: DC | PRN

## 2013-02-18 MED ORDER — METOPROLOL TARTRATE 12.5 MG HALF TABLET: 12.5000 mg | ORAL_TABLET | Freq: Two times a day (BID) | ORAL | Status: DC

## 2013-02-18 MED ORDER — ISOSORBIDE MONONITRATE ER 30 MG PO TB24: 30.0000 mg | ORAL_TABLET | Freq: Every day | ORAL | Status: DC

## 2013-02-18 MED ORDER — NITROGLYCERIN 0.4 MG SL SUBL: 0.4000 mg | SUBLINGUAL_TABLET | SUBLINGUAL | Status: AC | PRN

## 2013-02-18 MED ORDER — TICAGRELOR 90 MG PO TABS: 90.0000 mg | ORAL_TABLET | Freq: Two times a day (BID) | ORAL | Status: DC

## 2013-02-18 MED ORDER — ASPIRIN 81 MG PO TBEC: 81.0000 mg | DELAYED_RELEASE_TABLET | Freq: Every day | ORAL | Status: DC

## 2013-02-18 MED ORDER — ATORVASTATIN CALCIUM 80 MG PO TABS: 80.0000 mg | ORAL_TABLET | Freq: Every day | ORAL | Status: DC

## 2013-02-18 MED FILL — Sodium Chloride IV Soln 0.9%: INTRAVENOUS | Qty: 50 | Status: AC

## 2013-02-18 NOTE — Progress Notes (Signed)
Subjective:  No CP/SOB  Objective:  Temp:  [98 F (36.7 C)-98.7 F (37.1 C)] 98.3 F (36.8 C) (11/11 0748) Pulse Rate:  [67-87] 80 (11/11 0748) Resp:  [16-24] 18 (11/11 0748) BP: (90-156)/(49-112) 154/89 mmHg (11/11 0748) SpO2:  [95 %-100 %] 97 % (11/11 0748) Weight:  [235 lb 14.3 oz (107 kg)] 235 lb 14.3 oz (107 kg) (11/11 0006) Weight change:   Intake/Output from previous day: 11/10 0701 - 11/11 0700 In: 1263.6 [P.O.:480; I.V.:783.6] Out: 1300 [Urine:1300]  Intake/Output from this shift:    Physical Exam: General appearance: alert and no distress Neck: no adenopathy, no carotid bruit, no JVD, supple, symmetrical, trachea midline and thyroid not enlarged, symmetric, no tenderness/mass/nodules Lungs: clear to auscultation bilaterally Heart: regular rate and rhythm, S1, S2 normal, no murmur, click, rub or gallop Extremities: extremities normal, atraumatic, no cyanosis or edema and Right groin OK  Lab Results: Results for orders placed during the hospital encounter of 02/15/13 (from the past 48 hour(s))  GLUCOSE, CAPILLARY     Status: Abnormal   Collection Time    02/16/13 12:06 PM      Result Value Range   Glucose-Capillary 144 (*) 70 - 99 mg/dL  GLUCOSE, CAPILLARY     Status: Abnormal   Collection Time    02/16/13  4:51 PM      Result Value Range   Glucose-Capillary 148 (*) 70 - 99 mg/dL   Comment 1 Notify RN     Comment 2 Documented in Chart    GLUCOSE, CAPILLARY     Status: Abnormal   Collection Time    02/16/13  9:54 PM      Result Value Range   Glucose-Capillary 254 (*) 70 - 99 mg/dL  HEPARIN LEVEL (UNFRACTIONATED)     Status: None   Collection Time    02/17/13  5:05 AM      Result Value Range   Heparin Unfractionated 0.49  0.30 - 0.70 IU/mL   Comment:            IF HEPARIN RESULTS ARE BELOW     EXPECTED VALUES, AND PATIENT     DOSAGE HAS BEEN CONFIRMED,     SUGGEST FOLLOW UP TESTING     OF ANTITHROMBIN III LEVELS.  CBC     Status: Abnormal   Collection Time    02/17/13  5:05 AM      Result Value Range   WBC 6.7  4.0 - 10.5 K/uL   RBC 3.55 (*) 3.87 - 5.11 MIL/uL   Hemoglobin 10.8 (*) 12.0 - 15.0 g/dL   HCT 16.1 (*) 09.6 - 04.5 %   MCV 91.8  78.0 - 100.0 fL   MCH 30.4  26.0 - 34.0 pg   MCHC 33.1  30.0 - 36.0 g/dL   RDW 40.9  81.1 - 91.4 %   Platelets 173  150 - 400 K/uL  TROPONIN I     Status: Abnormal   Collection Time    02/17/13  5:05 AM      Result Value Range   Troponin I 6.08 (*) <0.30 ng/mL   Comment:            Due to the release kinetics of cTnI,     a negative result within the first hours     of the onset of symptoms does not rule out     myocardial infarction with certainty.     If myocardial infarction is still suspected,  repeat the test at appropriate intervals.     REPEATED TO VERIFY     CRITICAL VALUE NOTED.  VALUE IS CONSISTENT WITH PREVIOUSLY REPORTED AND CALLED VALUE.  BASIC METABOLIC PANEL     Status: Abnormal   Collection Time    02/17/13  5:05 AM      Result Value Range   Sodium 140  135 - 145 mEq/L   Potassium 3.6  3.5 - 5.1 mEq/L   Chloride 103  96 - 112 mEq/L   CO2 28  19 - 32 mEq/L   Glucose, Bld 163 (*) 70 - 99 mg/dL   BUN 25 (*) 6 - 23 mg/dL   Creatinine, Ser 1.61 (*) 0.50 - 1.10 mg/dL   Calcium 9.2  8.4 - 09.6 mg/dL   GFR calc non Af Amer 43 (*) >90 mL/min   GFR calc Af Amer 50 (*) >90 mL/min   Comment: (NOTE)     The eGFR has been calculated using the CKD EPI equation.     This calculation has not been validated in all clinical situations.     eGFR's persistently <90 mL/min signify possible Chronic Kidney     Disease.  PROTIME-INR     Status: None   Collection Time    02/17/13  5:05 AM      Result Value Range   Prothrombin Time 14.5  11.6 - 15.2 seconds   INR 1.15  0.00 - 1.49  GLUCOSE, CAPILLARY     Status: Abnormal   Collection Time    02/17/13  8:27 AM      Result Value Range   Glucose-Capillary 143 (*) 70 - 99 mg/dL  GLUCOSE, CAPILLARY     Status: Abnormal    Collection Time    02/17/13 12:24 PM      Result Value Range   Glucose-Capillary 142 (*) 70 - 99 mg/dL  POCT ACTIVATED CLOTTING TIME     Status: None   Collection Time    02/17/13  3:04 PM      Result Value Range   Activated Clotting Time 365    GLUCOSE, CAPILLARY     Status: Abnormal   Collection Time    02/17/13  3:52 PM      Result Value Range   Glucose-Capillary 142 (*) 70 - 99 mg/dL  GLUCOSE, CAPILLARY     Status: Abnormal   Collection Time    02/17/13  5:36 PM      Result Value Range   Glucose-Capillary 193 (*) 70 - 99 mg/dL   Comment 1 Notify RN    GLUCOSE, CAPILLARY     Status: Abnormal   Collection Time    02/17/13  9:16 PM      Result Value Range   Glucose-Capillary 353 (*) 70 - 99 mg/dL   Comment 1 Notify RN     Comment 2 Documented in Chart    CBC     Status: Abnormal   Collection Time    02/18/13  5:20 AM      Result Value Range   WBC 8.5  4.0 - 10.5 K/uL   RBC 3.60 (*) 3.87 - 5.11 MIL/uL   Hemoglobin 10.7 (*) 12.0 - 15.0 g/dL   HCT 04.5 (*) 40.9 - 81.1 %   MCV 89.4  78.0 - 100.0 fL   MCH 29.7  26.0 - 34.0 pg   MCHC 33.2  30.0 - 36.0 g/dL   RDW 91.4  78.2 - 95.6 %   Platelets 178  150 - 400 K/uL  BASIC METABOLIC PANEL     Status: Abnormal   Collection Time    02/18/13  5:20 AM      Result Value Range   Sodium 136  135 - 145 mEq/L   Potassium 3.3 (*) 3.5 - 5.1 mEq/L   Chloride 102  96 - 112 mEq/L   CO2 22  19 - 32 mEq/L   Glucose, Bld 278 (*) 70 - 99 mg/dL   BUN 20  6 - 23 mg/dL   Creatinine, Ser 6.21  0.50 - 1.10 mg/dL   Calcium 8.9  8.4 - 30.8 mg/dL   GFR calc non Af Amer 60 (*) >90 mL/min   GFR calc Af Amer 70 (*) >90 mL/min   Comment: (NOTE)     The eGFR has been calculated using the CKD EPI equation.     This calculation has not been validated in all clinical situations.     eGFR's persistently <90 mL/min signify possible Chronic Kidney     Disease.  GLUCOSE, CAPILLARY     Status: Abnormal   Collection Time    02/18/13  7:50 AM       Result Value Range   Glucose-Capillary 262 (*) 70 - 99 mg/dL    Imaging: Imaging results have been reviewed  Assessment/Plan:   1. Principal Problem: 2.   NSTEMI (non-ST elevated myocardial infarction) 3. Active Problems: 4.   Hypertension 5.   Dyslipidemia 6.   T2DM (type 2 diabetes mellitus) 7.   Time Spent Directly with Patient:  20 minutes  Length of Stay:  LOS: 3 days   Admitted with NSTEMI. S/P LAD and LCX-OM PCI/Stent with DES. Exam benign. Mild LV dysfunction by 2D. Needs DAPT for 12 months. Ambulated in hall. On appropriate meds. OK for D/C home. ROV with [facility]LP 1-2 wks then with Dr. Wonda Cheng after that.  Runell Gess 02/18/2013, 9:33 AM

## 2013-02-18 NOTE — Discharge Summary (Signed)
Patient ID: Phyllis Conner,  MRN: [id number], DOB/AGE: 09-07-1945 67 y.o.  Admit date: 02/15/2013 Discharge date: 02/18/2013  Primary Care Provider: Dr Parke Simmers Primary Cardiologist: Dr Herbie Baltimore  Discharge Diagnoses Principal Problem:   NSTEMI (non-ST elevated myocardial infarction) Active Problems:   CAD (coronary artery disease)- LAD/OM1 DES 02/17/13   T2DM (type 2 diabetes mellitus)   Hypertension   Dyslipidemia   Pulmonary embolism August 2011 (syncope)    Procedures: LAD/ OM1 DES 02/17/13   Hospital Course: 67 y/o followed by Dr Lowella Bandy with HTN, Type 2 DM, dyslipidemia, and PE in Aug 2013 (presented with syncope then). She was admitted 02/15/13 with a NSTEMI with AL EKG changes. Cath done 02/15/13 revealed LAD and OM1 disease and she underwent intervention with DES placed in both arteries. Her EF was 45-50% by echo. ASA, Brilinta, Lipitor, and Lopressor were added to her home medications. She will follow up with Boyce Medici PA in 2 weeks and then Dr Herbie Baltimore in a couple of months.   Discharge Vitals:  Blood pressure 154/89, pulse 80, temperature 98.3 F (36.8 C), temperature source Oral, resp. rate 18, height 5\' 3"  (1.6 m), weight 235 lb 14.3 oz (107 kg), SpO2 97.00%.    Labs: Results for orders placed during the hospital encounter of 02/15/13 (from the past 48 hour(s))  GLUCOSE, CAPILLARY     Status: Abnormal   Collection Time    02/16/13  4:51 PM      Result Value Range   Glucose-Capillary 148 (*) 70 - 99 mg/dL   Comment 1 Notify RN     Comment 2 Documented in Chart    GLUCOSE, CAPILLARY     Status: Abnormal   Collection Time    02/16/13  9:54 PM      Result Value Range   Glucose-Capillary 254 (*) 70 - 99 mg/dL  HEPARIN LEVEL (UNFRACTIONATED)     Status: None   Collection Time    02/17/13  5:05 AM      Result Value Range   Heparin Unfractionated 0.49  0.30 - 0.70 IU/mL   Comment:            IF HEPARIN RESULTS ARE BELOW     EXPECTED VALUES, AND PATIENT   DOSAGE HAS BEEN CONFIRMED,     SUGGEST FOLLOW UP TESTING     OF ANTITHROMBIN III LEVELS.  CBC     Status: Abnormal   Collection Time    02/17/13  5:05 AM      Result Value Range   WBC 6.7  4.0 - 10.5 K/uL   RBC 3.55 (*) 3.87 - 5.11 MIL/uL   Hemoglobin 10.8 (*) 12.0 - 15.0 g/dL   HCT 69.6 (*) 29.5 - 28.4 %   MCV 91.8  78.0 - 100.0 fL   MCH 30.4  26.0 - 34.0 pg   MCHC 33.1  30.0 - 36.0 g/dL   RDW 13.2  44.0 - 10.2 %   Platelets 173  150 - 400 K/uL  TROPONIN I     Status: Abnormal   Collection Time    02/17/13  5:05 AM      Result Value Range   Troponin I 6.08 (*) <0.30 ng/mL   Comment:            Due to the release kinetics of cTnI,     a negative result within the first hours     of the onset of symptoms does not rule out     myocardial infarction  with certainty.     If myocardial infarction is still suspected,     repeat the test at appropriate intervals.     REPEATED TO VERIFY     CRITICAL VALUE NOTED.  VALUE IS CONSISTENT WITH PREVIOUSLY REPORTED AND CALLED VALUE.  BASIC METABOLIC PANEL     Status: Abnormal   Collection Time    02/17/13  5:05 AM      Result Value Range   Sodium 140  135 - 145 mEq/L   Potassium 3.6  3.5 - 5.1 mEq/L   Chloride 103  96 - 112 mEq/L   CO2 28  19 - 32 mEq/L   Glucose, Bld 163 (*) 70 - 99 mg/dL   BUN 25 (*) 6 - 23 mg/dL   Creatinine, Ser 1.61 (*) 0.50 - 1.10 mg/dL   Calcium 9.2  8.4 - 09.6 mg/dL   GFR calc non Af Amer 43 (*) >90 mL/min   GFR calc Af Amer 50 (*) >90 mL/min   Comment: (NOTE)     The eGFR has been calculated using the CKD EPI equation.     This calculation has not been validated in all clinical situations.     eGFR's persistently <90 mL/min signify possible Chronic Kidney     Disease.  PROTIME-INR     Status: None   Collection Time    02/17/13  5:05 AM      Result Value Range   Prothrombin Time 14.5  11.6 - 15.2 seconds   INR 1.15  0.00 - 1.49  GLUCOSE, CAPILLARY     Status: Abnormal   Collection Time    02/17/13   8:27 AM      Result Value Range   Glucose-Capillary 143 (*) 70 - 99 mg/dL  GLUCOSE, CAPILLARY     Status: Abnormal   Collection Time    02/17/13 12:24 PM      Result Value Range   Glucose-Capillary 142 (*) 70 - 99 mg/dL  POCT ACTIVATED CLOTTING TIME     Status: None   Collection Time    02/17/13  3:04 PM      Result Value Range   Activated Clotting Time 365    GLUCOSE, CAPILLARY     Status: Abnormal   Collection Time    02/17/13  3:52 PM      Result Value Range   Glucose-Capillary 142 (*) 70 - 99 mg/dL  GLUCOSE, CAPILLARY     Status: Abnormal   Collection Time    02/17/13  5:36 PM      Result Value Range   Glucose-Capillary 193 (*) 70 - 99 mg/dL   Comment 1 Notify RN    GLUCOSE, CAPILLARY     Status: Abnormal   Collection Time    02/17/13  9:16 PM      Result Value Range   Glucose-Capillary 353 (*) 70 - 99 mg/dL   Comment 1 Notify RN     Comment 2 Documented in Chart    CBC     Status: Abnormal   Collection Time    02/18/13  5:20 AM      Result Value Range   WBC 8.5  4.0 - 10.5 K/uL   RBC 3.60 (*) 3.87 - 5.11 MIL/uL   Hemoglobin 10.7 (*) 12.0 - 15.0 g/dL   HCT 04.5 (*) 40.9 - 81.1 %   MCV 89.4  78.0 - 100.0 fL   MCH 29.7  26.0 - 34.0 pg   MCHC 33.2  30.0 -  36.0 g/dL   RDW 64.4  03.4 - 74.2 %   Platelets 178  150 - 400 K/uL  BASIC METABOLIC PANEL     Status: Abnormal   Collection Time    02/18/13  5:20 AM      Result Value Range   Sodium 136  135 - 145 mEq/L   Potassium 3.3 (*) 3.5 - 5.1 mEq/L   Chloride 102  96 - 112 mEq/L   CO2 22  19 - 32 mEq/L   Glucose, Bld 278 (*) 70 - 99 mg/dL   BUN 20  6 - 23 mg/dL   Creatinine, Ser 5.95  0.50 - 1.10 mg/dL   Calcium 8.9  8.4 - 63.8 mg/dL   GFR calc non Af Amer 60 (*) >90 mL/min   GFR calc Af Amer 70 (*) >90 mL/min   Comment: (NOTE)     The eGFR has been calculated using the CKD EPI equation.     This calculation has not been validated in all clinical situations.     eGFR's persistently <90 mL/min signify possible  Chronic Kidney     Disease.  GLUCOSE, CAPILLARY     Status: Abnormal   Collection Time    02/18/13  7:50 AM      Result Value Range   Glucose-Capillary 262 (*) 70 - 99 mg/dL    Disposition:  Follow-up Information   Follow up with Robbie Lis, PA-C On 03/04/2013. (2:30 pm)    Specialty:  Cardiology   Contact information:   [address] [place] [address] [phone]       Discharge Medications:    Medication List    STOP taking these medications       CELEBREX PO     NAPROXEN PO      TAKE these medications       acetaminophen 325 MG tablet  Commonly known as:  TYLENOL  Take 2 tablets (650 mg total) by mouth every 4 (four) hours as needed for headache or mild pain.     aspirin 81 MG EC tablet  Take 1 tablet (81 mg total) by mouth daily.     atorvastatin 80 MG tablet  Commonly known as:  LIPITOR  Take 1 tablet (80 mg total) by mouth daily at 6 PM.     chlorthalidone 25 MG tablet  Commonly known as:  HYGROTON  Take 25 mg by mouth daily.     HYDROcodone-acetaminophen 7.5-325 MG per tablet  Commonly known as:  NORCO  Take 1 tablet by mouth 2 (two) times daily as needed for moderate pain.     ibuprofen 800 MG tablet  Commonly known as:  ADVIL,MOTRIN  Take 800 mg by mouth 2 (two) times daily as needed for mild pain or moderate pain.     isosorbide mononitrate 30 MG 24 hr tablet  Commonly known as:  IMDUR  Take 1 tablet (30 mg total) by mouth daily.     losartan 100 MG tablet  Commonly known as:  COZAAR  Take 100 mg by mouth daily.     metFORMIN 500 MG tablet  Commonly known as:  GLUCOPHAGE  Take 1 tablet (500 mg total) by mouth daily.  Start taking on:  02/20/2013     metoprolol tartrate 12.5 mg Tabs tablet  Commonly known as:  LOPRESSOR  Take 0.5 tablets (12.5 mg total) by mouth 2 (two) times daily.     NEXIUM PO  Take 1 capsule by mouth daily.  nitroGLYCERIN 0.4 MG SL tablet  Commonly known as:  NITROSTAT  Place 1  tablet (0.4 mg total) under the tongue every 5 (five) minutes x 3 doses as needed for chest pain.     sitaGLIPtin 100 MG tablet  Commonly known as:  JANUVIA  Take 100 mg by mouth daily.     Ticagrelor 90 MG Tabs tablet  Commonly known as:  BRILINTA  Take 1 tablet (90 mg total) by mouth 2 (two) times daily.         Duration of Discharge Encounter: Greater than 30 minutes including physician time.  Jolene Provost PA-C 02/18/2013 1:06 PM

## 2013-02-18 NOTE — Progress Notes (Signed)
CARDIAC REHAB PHASE I   PRE:  Rate/Rhythm: 108 ST  Just finished bathing  BP:  Supine:   Sitting: 154/89  Standing:    SaO2:   MODE:  Ambulation: 700 ft   POST:  Rate/Rhythm: 112 ST  BP:  Supine:   Sitting: 142/85  Standing:    SaO2:  0800-0915 Pt walked 700 ft with slow pace denying CP. Tolerated well. Education completed with pt and husband. Understanding voiced. Discussed carb counting as pt had never done this. Gave handouts on diabetic and heart healthy diets. Encouraged pt to attend CRP 2 where she can get assistance with diet. Gave permission to refer to [facility] Phase 2.    Luetta Nutting, RN BSN  02/18/2013 9:11 AM

## 2013-03-04 ENCOUNTER — Ambulatory Visit (INDEPENDENT_AMBULATORY_CARE_PROVIDER_SITE_OTHER): Payer: PRIVATE HEALTH INSURANCE | Admitting: Cardiology

## 2013-03-04 ENCOUNTER — Encounter: Payer: Self-pay | Admitting: Cardiology

## 2013-03-04 VITALS — BP 110/70 | HR 80 | Ht 64.0 in | Wt 229.7 lb

## 2013-03-04 DIAGNOSIS — E119 Type 2 diabetes mellitus without complications: Secondary | ICD-10-CM

## 2013-03-04 DIAGNOSIS — I10 Essential (primary) hypertension: Secondary | ICD-10-CM

## 2013-03-04 DIAGNOSIS — I2510 Atherosclerotic heart disease of native coronary artery without angina pectoris: Secondary | ICD-10-CM

## 2013-03-04 DIAGNOSIS — E785 Hyperlipidemia, unspecified: Secondary | ICD-10-CM

## 2013-03-04 NOTE — Assessment & Plan Note (Addendum)
Stable - NSTEMI 02/15/13. S/p PCI + DES to LAD and OM1. EF 45-50% on echo. No further angina. On ASA and Brilinta. Will need to resume DAPT for a minimum of 1 year. Will also continue Lopressor, Imdur and Lipitor. Plan to start phase 2 cardiac rehab at [facility].

## 2013-03-04 NOTE — Assessment & Plan Note (Signed)
Most recent Lipid panel showed High LDL level at 154. HDL was good at 58. Total Cholesterol was elevated at 239. Will need to continue high dose Lipitor at 80 mg daily for LDL lowering. We would like to achieve LDL goal of at least <100 to reduce future CV risk. <70 would be more desirable. I have discussed the importance of proper diet and exercise to help facilitate LDL lowering.

## 2013-03-04 NOTE — Progress Notes (Signed)
03/04/2013 Bethann Humble   1946/03/15  [id number]  Primary Physicia Geraldo Pitter, MD Primary Cardiologist: Dr. Herbie Baltimore  HPI:  The patient is a 67 y/o AAF followed by Dr Lowella Bandy with HTN, Type 2 DM, dyslipidemia, and PE in Aug 2013 (presented with syncope then). She was admitted 02/15/13 with a NSTEMI with AL EKG changes. Cath done 02/15/13 revealed LAD and OM1 disease and she underwent intervention with DES placed in both arteries. Her EF was 45-50% by echo. ASA, Brilinta, Lipitor, and Lopressor were added to her home medications.  She presents to clinic today for post-hospital follow-up. She presents without any complaints. She states that she has been doing well since discharge, denying any further angina or chest pain. She reports daily compliance with all of her medications. She has had no issues with SOB on Brilinta. She has been taking her Brilinta with low dose ASA. She has been working on adopting better eating habits. She expresses interest in starting phase 2 cardiac rehab.     Current Outpatient Prescriptions  Medication Sig Dispense Refill  . acetaminophen (TYLENOL) 325 MG tablet Take 2 tablets (650 mg total) by mouth every 4 (four) hours as needed for headache or mild pain.      Marland Kitchen aspirin EC 81 MG EC tablet Take 1 tablet (81 mg total) by mouth daily.      Marland Kitchen atorvastatin (LIPITOR) 80 MG tablet Take 1 tablet (80 mg total) by mouth daily at 6 PM.  90 tablet  3  . chlorthalidone (HYGROTON) 25 MG tablet Take 25 mg by mouth daily.      . Esomeprazole Magnesium (NEXIUM PO) Take 1 capsule by mouth daily.       Marland Kitchen ibuprofen (ADVIL,MOTRIN) 800 MG tablet Take 800 mg by mouth 2 (two) times daily as needed for mild pain or moderate pain.       . isosorbide mononitrate (IMDUR) 30 MG 24 hr tablet Take 1 tablet (30 mg total) by mouth daily.  90 tablet  3  . losartan (COZAAR) 100 MG tablet Take 100 mg by mouth daily.      . metFORMIN (GLUCOPHAGE) 500 MG tablet Take 1 tablet (500 mg total) by  mouth daily.      . metoprolol tartrate (LOPRESSOR) 12.5 mg TABS tablet Take 0.5 tablets (12.5 mg total) by mouth 2 (two) times daily.  90 tablet  3  . nitroGLYCERIN (NITROSTAT) 0.4 MG SL tablet Place 1 tablet (0.4 mg total) under the tongue every 5 (five) minutes x 3 doses as needed for chest pain.  25 tablet  2  . sitaGLIPtin (JANUVIA) 100 MG tablet Take 100 mg by mouth daily.      . Ticagrelor (BRILINTA) 90 MG TABS tablet Take 1 tablet (90 mg total) by mouth 2 (two) times daily.  60 tablet  11   No current facility-administered medications for this visit.    Allergies  Allergen Reactions  . Iohexol Hives and Other (See Comments)    Excessive sweating    History   Social History  . Marital Status: Single    Spouse Name: N/A    Number of Children: N/A  . Years of Education: N/A   Occupational History  . Not on file.   Social History Main Topics  . Smoking status: Never Smoker   . Smokeless tobacco: Not on file  . Alcohol Use: No  . Drug Use: No  . Sexual Activity: Not on file   Other Topics Concern  .  Not on file   Social History Narrative  . No narrative on file     Review of Systems: General: negative for chills, fever, night sweats or weight changes.  Cardiovascular: negative for chest pain, dyspnea on exertion, edema, orthopnea, palpitations, paroxysmal nocturnal dyspnea or shortness of breath Dermatological: negative for rash Respiratory: negative for cough or wheezing Urologic: negative for hematuria Abdominal: negative for nausea, vomiting, diarrhea, bright red blood per rectum, melena, or hematemesis Neurologic: negative for visual changes, syncope, or dizziness All other systems reviewed and are otherwise negative except as noted above.    Blood pressure 110/70, pulse 80, height 5\' 4"  (1.626 m), weight 229 lb 11.2 oz (104.191 kg).  General appearance: alert, cooperative, no distress and moderately obese Neck: no carotid bruit and no JVD Lungs: clear to  auscultation bilaterally Heart: regular rate and rhythm, S1, S2 normal, no murmur, click, rub or gallop Extremities: no LEE Pulses: 2+ and symmetric Skin: warm and dry Neurologic: Grossly normal  EKG NSR. HR: 80 bpm. No ischemic changes.   ASSESSMENT AND PLAN:   CAD (coronary artery disease)- LAD/OM1 DES 02/17/13  Stable - NSTEMI 02/15/13. S/p PCI + DES to LAD and OM1. EF 45-50% on echo. No further angina. On ASA and Brilinta. Will need to resume DAPT for a minimum of 1 year. Will also continue Lopressor, Imdur and Lipitor. Plan to start phase 2 cardiac rehab at [facility].  Hypertension Well controlled at today's office visit. BP: 110/70. Will continue current regimen. On Lopressor, 12.5 mg BID. Also on Cozaar 100 mg daily and Imdur 30 mg daily.   Dyslipidemia Most recent Lipid panel showed High LDL level at 154. HDL was good at 58. Total Cholesterol was elevated at 239. Will need to continue high dose Lipitor at 80 mg daily for LDL lowering. We would like to achieve LDL goal of at least <100 to reduce future CV risk. <70 would be more desirable. I have discussed the importance of proper diet and exercise to help facilitate LDL lowering.   T2DM (type 2 diabetes mellitus) Hgb A1c during hospitalization was elevated at 9.3. DM is being managed by her PCP, Dr. Parke Simmers. She is currently on 2 oral meds, Metformin and Januvia, which was recently added. I discussed with her that her Hgb A1c goal should be < 7.0. We discussed better diet and exercise to help her reach this goal. She has an appointment with her PCP in 2 weeks for DM f/u.     PLAN  Ms. Barrientes returns to clinic for post-hospital f/u, after receiving two DES to the LAD and OM1, in the setting of NSTEMI. She has been doing well since discharge. No angina and no SOB. She has been compliant with her medications. She seems to be tolerating her Brilinta well. We will resume our current mediation regimen, which includes DAPT w/ ASA + Brilinta,  BB, and Imdur. We will also continue high dose lipid therapy with Lipitor, for better reduction of high LDL. She will be enrolled in cardiac rehab. She seems motivated to begin an exercise routine and has been working on adopting a better diet. Her BP is well controlled, but she needs better control of her diabetes. We discussed Hgb A1c. I would like to see her lower her level to <7.0.  She has a follow-up with her PCP in 2 weeks for DM management. I have instructed to the patient to return in 2 months to see Dr. Herbie Baltimore.   Polk Minor, BRITTAINYPA-C 03/04/2013 4:11  PM

## 2013-03-04 NOTE — Assessment & Plan Note (Signed)
Hgb A1c during hospitalization was elevated at 9.3. DM is being managed by her PCP, Dr. Parke Simmers. She is currently on 2 oral meds, Metformin and Januvia, which was recently added. I discussed with her that her Hgb A1c goal should be < 7.0. We discussed better diet and exercise to help her reach this goal. She has an appointment with her PCP in 2 weeks for DM f/u.

## 2013-03-04 NOTE — Assessment & Plan Note (Addendum)
Well controlled at today's office visit. BP: 110/70. Will continue current regimen. On Lopressor, 12.5 mg BID. Also on Cozaar 100 mg daily and Imdur 30 mg daily.

## 2013-03-04 NOTE — Patient Instructions (Addendum)
Continue taking your medications as directed.  Plan to start cardiac rehab at [facility] Be sure to follow-up with your family doctor to work on improving your diabetes. Your hemoglobin A1c goal should be less than 7. Work on increasing daily exercise and work on [facility] will need to follow-up with Dr. Herbie Baltimore in 2 months

## 2013-03-05 ENCOUNTER — Encounter: Payer: Self-pay | Admitting: Cardiology

## 2013-05-05 ENCOUNTER — Ambulatory Visit (INDEPENDENT_AMBULATORY_CARE_PROVIDER_SITE_OTHER): Payer: PRIVATE HEALTH INSURANCE | Admitting: Cardiology

## 2013-05-05 ENCOUNTER — Encounter: Payer: Self-pay | Admitting: Cardiology

## 2013-05-05 VITALS — BP 118/90 | HR 78 | Ht 64.0 in | Wt 225.7 lb

## 2013-05-05 DIAGNOSIS — E669 Obesity, unspecified: Secondary | ICD-10-CM

## 2013-05-05 DIAGNOSIS — I2510 Atherosclerotic heart disease of native coronary artery without angina pectoris: Secondary | ICD-10-CM

## 2013-05-05 DIAGNOSIS — I10 Essential (primary) hypertension: Secondary | ICD-10-CM

## 2013-05-05 DIAGNOSIS — I2589 Other forms of chronic ischemic heart disease: Secondary | ICD-10-CM

## 2013-05-05 DIAGNOSIS — I255 Ischemic cardiomyopathy: Secondary | ICD-10-CM

## 2013-05-05 DIAGNOSIS — I214 Non-ST elevation (NSTEMI) myocardial infarction: Secondary | ICD-10-CM

## 2013-05-05 DIAGNOSIS — E785 Hyperlipidemia, unspecified: Secondary | ICD-10-CM

## 2013-05-05 HISTORY — DX: Obesity, unspecified: E66.9

## 2013-05-05 NOTE — Assessment & Plan Note (Signed)
Well-controlled on current regimen. No changes. If her blood pressure still stable in followup visit I would increase her metoprolol to 25 mg twice a day.

## 2013-05-05 NOTE — Assessment & Plan Note (Addendum)
Dietary modifications discussed, and exercise regimen recommended.  Patient instructions: Your physician discussed the importance of regular exercise and recommended that you start or continue a regular exercise program for good health.Start walking everyday for 10 minutes and increase by 5 minutes each week until you reach 25 minute walk. Once you reach 25 minutes walk a little faster.

## 2013-05-05 NOTE — Assessment & Plan Note (Signed)
Two-vessel PCI most likely the culprit was the LAD. This ago all other anterolateral EKG changes on the ECG that are now resolved. She is not having any more anginal symptoms. She sees her tolerating her dual antiplatelet therapy and statin well. She is on a beta blocker, ARB, nitrate and chlorthalidone for additional blood pressure control. Her EF was 45-50%. A likely recheck echocardiogram before I see her back in November. I would anticipate that she would have some improved function.

## 2013-05-05 NOTE — Progress Notes (Signed)
PATIENTBIANCE Conner MRN: [id number]  DOB: 1946-01-06   DOV:05/05/2013 PCP: Elyn Peers, MD  Clinic Note: Chief Complaint  Patient presents with  . 2 month visit    sob sometimes with walking, no chest pain, no edema    HPI: Phyllis Conner is a 68 y.o.  female with a PMH below who presents today for her second postop followup after non-STEMI. She is a patient of Dr. Criss Rosales with hypertension, diabetes mellitus type 2 and dyslipidemia as well as a history of PE who was admitted in November 2014 with a non-STEMI and minimal anterolateral ECG changes. She had both LAD and circumflex complex-OM1 lesions and underwent drug-eluting stent (DES) PCI. She is also noted to have mildly decreased ejection fraction of 45-50%. She was seen by Phyllis Henri, PA-C post discharge. She is doing well at that time. She was about to start cardiac rehabilitation phase II.  Interval History: She presents today doing relatively well with no major complaints. She says he is she's trying to get a lot of exercise urinary. It is not doing any routine exercise. If she does not see with her and she completed her cardiac rehabilitation. She sees a really happy with her new insulin regimen where she has the injector box in which she puts her insulin and that does the injections for her. From a cardiac standpoint, she notes occasionally getting short of breath when she first gets going from sitting to walking. Otherwise really denies any resting or exertional chest tightness/pressure or dyspnea. She has been trying to modify her diet and is excited to hear that she has lost 4 pounds since November.  The remainder of Cardiovascular ROS: no chest pain or dyspnea on exertion negative for - edema, irregular heartbeat, loss of consciousness, murmur, orthopnea, palpitations, paroxysmal nocturnal dyspnea, rapid heart rate, shortness of breath or Lightheadedness, dizziness, syncope/near-syncope, TIA/amaurosis fugax symptoms. No  melena, hematochezia or hematuria. No claudication.:  Past Medical History  Diagnosis Date  . Diabetes mellitus without complication   . Hypertension   . Hypercholesteremia   . Non-Q wave ST elevation myocardial infarction (STEMI) involving left anterior descending (LAD) coronary artery November 2014    PCI to LAD and circumflex  . CAD S/P percutaneous coronary angioplasty November 2014    Mid LAD 2.5 mm x 12 mm Promus DES (2.70 mm); proximal OM1 - Promus DES 2.75 mm x 16 mm (2.85 mm)    Prior Cardiac Evaluation and Past Surgical History: Past Surgical History  Procedure Laterality Date  . Abdominal hysterectomy  1990s  . Arthroscopic knee surgery Bilateral 1984, 2002    Arthroscopic  . Cardiac catheterization  November 2014    Mid LAD 95%, proximal OM1 80-90% --> two-vessel PCI  . Percutaneous coronary stent intervention (pci-s)  November 2014    Mid LAD 2.5 mm x 12 mm (2.75 mm) Promus DES; OM1 2.75 mm x 60 mm (2.85 mm) Promus DES  . Transthoracic echocardiogram  02/16/2013    EF 40-50%. Moderate concentric LVH. Moderate H. Shea of mid-distal septal and apical region. Grade 1 diastolic dysfunction. Moderate aortic sclerosis without stenosis.    Allergies  Allergen Reactions  . Iohexol Hives and Other (See Comments)    Excessive sweating    Current Outpatient Prescriptions  Medication Sig Dispense Refill  . aspirin EC 81 MG EC tablet Take 1 tablet (81 mg total) by mouth daily.      Marland Kitchen atorvastatin (LIPITOR) 80 MG tablet Take 1 tablet (80 mg  total) by mouth daily at 6 PM.  90 tablet  3  . chlorthalidone (HYGROTON) 25 MG tablet Take 25 mg by mouth daily.      . insulin aspart (NOVOLOG) 100 UNIT/ML injection 24 hour continuous dose  Abd. dosing      . INVESTIGATIONAL DRUG SIMPLE RECORD E[employer] :[phone]  STUDY DRUG  TAKE 1 TABLET TWICE A DAY      . isosorbide mononitrate (IMDUR) 30 MG 24 hr tablet Take 1 tablet (30 mg total) by mouth daily.  90 tablet  3  . losartan  (COZAAR) 100 MG tablet Take 100 mg by mouth daily.      . metFORMIN (GLUCOPHAGE) 500 MG tablet Take 1 tablet (500 mg total) by mouth daily.      . metoprolol tartrate (LOPRESSOR) 12.5 mg TABS tablet Take 0.5 tablets (12.5 mg total) by mouth 2 (two) times daily.  90 tablet  3  . nitroGLYCERIN (NITROSTAT) 0.4 MG SL tablet Place 1 tablet (0.4 mg total) under the tongue every 5 (five) minutes x 3 doses as needed for chest pain.  25 tablet  2  . sitaGLIPtin (JANUVIA) 100 MG tablet Take 100 mg by mouth daily.      . Ticagrelor (BRILINTA) 90 MG TABS tablet Take 1 tablet (90 mg total) by mouth 2 (two) times daily.  60 tablet  11   No current facility-administered medications for this visit.    History   Social History Narrative   She is married with 4 children.   She does not smoke and does not drink alcohol.   ROS: A comprehensive Review of Systems - Negative except Mild bruising.  PHYSICAL EXAM BP 118/90  Pulse 78  Ht 5\' 4"  (1.626 m)  Wt 225 lb 11.2 oz (102.377 kg)  BMI 38.72 kg/m2 General appearance: alert, cooperative, appears stated age, no distress, moderately obese and  well-nourished and well-groomed. Normal moood and affect. Neck: no adenopathy, no carotid bruit, no JVD, supple, symmetrical, trachea midline and thyroid not enlarged, symmetric, no tenderness/mass/nodules Lungs: clear to auscultation bilaterally, normal percussion bilaterally and Nonlabored, good air movement Heart: regular rate and rhythm, S1, S2 normal, no murmur, click, rub or gallop and normal apical impulse Abdomen: soft, non-tender; bowel sounds normal; no masses,  no organomegaly and Moderate to severely obese (truncal obesity) Extremities: extremities normal, atraumatic, no cyanosis or edema and Mild bruising Pulses: 2+ and symmetric Neurologic: Alert and oriented X 3, normal strength and tone. Normal symmetric reflexes. Normal coordination and gait  [id number] today: Yes Rate: 76 , Rhythm: NSR, poor  R-wave progression in anterior leads. No ischemic changes (anterior T-wave inversions no longer present) Recent Labs: None since November 2014. -- TC 239, TG 137, HDL 58, LDL 154.  ASSESSMENT / PLAN: NSTEMI (non-ST elevated myocardial infarction) Two-vessel PCI most likely the culprit was the LAD. This ago all other anterolateral EKG changes on the ECG that are now resolved. She is not having any more anginal symptoms. She sees her tolerating her dual antiplatelet therapy and statin well. She is on a beta blocker, ARB, nitrate and chlorthalidone for additional blood pressure control. Her EF was 45-50%. A likely recheck echocardiogram before I see her back in November. I would anticipate that she would have some improved function.  CAD (coronary artery disease)- LAD/OM1 DES 02/17/13 Two-vessel PCI would DES stents. On DAPT with ASA and Brilinta. No bleeding concerns. On stable medication regimen. Exercise regimen recommended.  Dyslipidemia On high-dose atorvastatin. She says her lipids were  just checked by her PCP who also follows her diabetes. Goal LDL is less than 70. HDL was actually target range last time. Also recommended dietary modification as well as increase exercise. Would consider nutrition consultation.  Hypertension Well-controlled on current regimen. No changes. If her blood pressure still stable in followup visit I would increase her metoprolol to 25 mg twice a day.  Obesity (BMI 30-39.9) Dietary modifications discussed, and exercise regimen recommended.  Patient instructions: Your physician discussed the importance of regular exercise and recommended that you start or continue a regular exercise program for good health.Start walking everyday for 10 minutes and increase by 5 minutes each week until you reach 25 minute walk. Once you reach 25 minutes walk a little faster.      Orders Placed This Encounter  Procedures  . EKG 12-Lead  . 2D Echocardiogram without contrast      Standing Status: Future     Number of Occurrences:      Standing Expiration Date: 05/05/2014    Scheduling Instructions:     Nov 2015 before doctor's appointment    Order Specific Question:  Type of Echo    Answer:  Complete    Order Specific Question:  Where should this test be performed    Answer:  [facility]    Order Specific Question:  Reason for exam-Echo    Answer:  Cardiomyopathy-Ischemic  414.8    Followup: In May with Phyllis Henri, PA-C. In November-December with Dr. Ellyn Hack. 2-D echocardiogram prior to November the December visit with Dr. Ellyn Hack.  Reilley Latorre W. Ellyn Hack, M.D., M.S. [facility] [address] [place], [place]  [address]  [phone] Pager # [phone]

## 2013-05-05 NOTE — Assessment & Plan Note (Signed)
On high-dose atorvastatin. She says her lipids were just checked by her PCP who also follows her diabetes. Goal LDL is less than 70. HDL was actually target range last time. Also recommended dietary modification as well as increase exercise. Would consider nutrition consultation.

## 2013-05-05 NOTE — Patient Instructions (Signed)
Your physician has requested that you have an echocardiogram. Echocardiography is a painless test that uses sound waves to create images of your heart. It provides your doctor with information about the size and shape of your heart and how well your heart's chambers and valves are working. This procedure takes approximately one hour. There are no restrictions for this procedure.  Schedule in nov 2015   Your physician discussed the importance of regular exercise and recommended that you start or continue a regular exercise program for good health.Start walking everyday for 10 minutes and increase by 5 minutes each week until you reach 25 minute walk. Once you reach 25 minutes walk a little faster.  Your physician recommends that you schedule a follow-up appointment in 3 months with Ellen Henri PA  Your physician wants you to follow-up in 6 MONTHS Dr Ellyn Hack after echo is completed.  You will receive a reminder letter in the mail two months in advance. If you don't receive a letter, please call our office to schedule the follow-up appointment.

## 2013-05-05 NOTE — Assessment & Plan Note (Signed)
Two-vessel PCI would DES stents. On DAPT with ASA and Brilinta. No bleeding concerns. On stable medication regimen. Exercise regimen recommended.

## 2013-05-14 ENCOUNTER — Telehealth: Payer: Self-pay | Admitting: *Deleted

## 2013-05-14 NOTE — Telephone Encounter (Signed)
Cardiac rehab phase ll order/medicaid -signed and faxed on 05/13/2013

## 2013-05-28 ENCOUNTER — Telehealth: Payer: Self-pay | Admitting: *Deleted

## 2013-05-28 NOTE — Telephone Encounter (Signed)
Message copied by Raiford Simmonds on Wed May 28, 2013  5:49 PM ------      Message from: Magda Kiel      Created: Tue May 27, 2013  9:22 AM      Regarding: Cardiac rehab       Good morning [name],            [name] is scheduled to come to orientation for cardiac rehab this Thursday.  I am faxing over Dr Allison Quarry signed referral. If you could have the exercise intensity completed and returned to me it would be greatly appreciated.                  Thanks             Have a great day            Verdis Frederickson ------

## 2013-05-28 NOTE — Telephone Encounter (Signed)
RETURNED [place]

## 2013-05-29 ENCOUNTER — Ambulatory Visit (HOSPITAL_COMMUNITY): Payer: PRIVATE HEALTH INSURANCE

## 2013-06-02 ENCOUNTER — Ambulatory Visit (HOSPITAL_COMMUNITY): Payer: PRIVATE HEALTH INSURANCE

## 2013-06-04 ENCOUNTER — Ambulatory Visit (HOSPITAL_COMMUNITY): Payer: PRIVATE HEALTH INSURANCE

## 2013-06-06 ENCOUNTER — Ambulatory Visit (HOSPITAL_COMMUNITY): Payer: PRIVATE HEALTH INSURANCE

## 2013-06-09 ENCOUNTER — Ambulatory Visit (HOSPITAL_COMMUNITY): Payer: PRIVATE HEALTH INSURANCE

## 2013-06-11 ENCOUNTER — Ambulatory Visit (HOSPITAL_COMMUNITY): Payer: PRIVATE HEALTH INSURANCE

## 2013-06-13 ENCOUNTER — Ambulatory Visit (HOSPITAL_COMMUNITY): Payer: PRIVATE HEALTH INSURANCE

## 2013-06-16 ENCOUNTER — Ambulatory Visit (HOSPITAL_COMMUNITY): Payer: PRIVATE HEALTH INSURANCE

## 2013-06-18 ENCOUNTER — Ambulatory Visit (HOSPITAL_COMMUNITY): Payer: PRIVATE HEALTH INSURANCE

## 2013-06-20 ENCOUNTER — Ambulatory Visit (HOSPITAL_COMMUNITY): Payer: PRIVATE HEALTH INSURANCE

## 2013-06-23 ENCOUNTER — Ambulatory Visit (HOSPITAL_COMMUNITY): Payer: PRIVATE HEALTH INSURANCE

## 2013-06-25 ENCOUNTER — Ambulatory Visit (HOSPITAL_COMMUNITY): Payer: PRIVATE HEALTH INSURANCE

## 2013-06-27 ENCOUNTER — Ambulatory Visit (HOSPITAL_COMMUNITY): Payer: PRIVATE HEALTH INSURANCE

## 2013-06-30 ENCOUNTER — Ambulatory Visit (HOSPITAL_COMMUNITY): Payer: PRIVATE HEALTH INSURANCE

## 2013-07-02 ENCOUNTER — Ambulatory Visit (HOSPITAL_COMMUNITY): Payer: PRIVATE HEALTH INSURANCE

## 2013-07-04 ENCOUNTER — Ambulatory Visit (HOSPITAL_COMMUNITY): Payer: PRIVATE HEALTH INSURANCE

## 2013-07-07 ENCOUNTER — Ambulatory Visit (HOSPITAL_COMMUNITY): Payer: PRIVATE HEALTH INSURANCE

## 2013-07-09 ENCOUNTER — Ambulatory Visit (HOSPITAL_COMMUNITY): Payer: PRIVATE HEALTH INSURANCE

## 2013-07-11 ENCOUNTER — Ambulatory Visit (HOSPITAL_COMMUNITY): Payer: PRIVATE HEALTH INSURANCE

## 2013-07-14 ENCOUNTER — Ambulatory Visit (HOSPITAL_COMMUNITY): Payer: PRIVATE HEALTH INSURANCE

## 2013-07-16 ENCOUNTER — Ambulatory Visit (HOSPITAL_COMMUNITY): Payer: PRIVATE HEALTH INSURANCE

## 2013-07-18 ENCOUNTER — Ambulatory Visit (HOSPITAL_COMMUNITY): Payer: PRIVATE HEALTH INSURANCE

## 2013-07-21 ENCOUNTER — Ambulatory Visit (HOSPITAL_COMMUNITY): Payer: PRIVATE HEALTH INSURANCE

## 2013-07-23 ENCOUNTER — Ambulatory Visit (HOSPITAL_COMMUNITY): Payer: PRIVATE HEALTH INSURANCE

## 2013-07-25 ENCOUNTER — Ambulatory Visit (HOSPITAL_COMMUNITY): Payer: PRIVATE HEALTH INSURANCE

## 2013-07-28 ENCOUNTER — Ambulatory Visit (HOSPITAL_COMMUNITY): Payer: PRIVATE HEALTH INSURANCE

## 2013-07-30 ENCOUNTER — Ambulatory Visit (HOSPITAL_COMMUNITY): Payer: PRIVATE HEALTH INSURANCE

## 2013-08-01 ENCOUNTER — Ambulatory Visit (HOSPITAL_COMMUNITY): Payer: PRIVATE HEALTH INSURANCE

## 2013-08-04 ENCOUNTER — Ambulatory Visit (HOSPITAL_COMMUNITY): Payer: PRIVATE HEALTH INSURANCE

## 2013-08-05 ENCOUNTER — Ambulatory Visit: Payer: PRIVATE HEALTH INSURANCE | Admitting: Cardiology

## 2013-08-06 ENCOUNTER — Ambulatory Visit (HOSPITAL_COMMUNITY): Payer: PRIVATE HEALTH INSURANCE

## 2013-08-08 ENCOUNTER — Ambulatory Visit (HOSPITAL_COMMUNITY): Payer: PRIVATE HEALTH INSURANCE

## 2013-08-11 ENCOUNTER — Ambulatory Visit (HOSPITAL_COMMUNITY): Payer: PRIVATE HEALTH INSURANCE

## 2013-08-13 ENCOUNTER — Ambulatory Visit (HOSPITAL_COMMUNITY): Payer: PRIVATE HEALTH INSURANCE

## 2013-08-15 ENCOUNTER — Ambulatory Visit (HOSPITAL_COMMUNITY): Payer: PRIVATE HEALTH INSURANCE

## 2013-08-15 ENCOUNTER — Ambulatory Visit (INDEPENDENT_AMBULATORY_CARE_PROVIDER_SITE_OTHER): Payer: PRIVATE HEALTH INSURANCE | Admitting: Cardiology

## 2013-08-15 VITALS — BP 92/52 | Ht 64.0 in | Wt 197.0 lb

## 2013-08-15 DIAGNOSIS — E785 Hyperlipidemia, unspecified: Secondary | ICD-10-CM

## 2013-08-15 DIAGNOSIS — I951 Orthostatic hypotension: Secondary | ICD-10-CM

## 2013-08-15 DIAGNOSIS — I2510 Atherosclerotic heart disease of native coronary artery without angina pectoris: Secondary | ICD-10-CM

## 2013-08-15 DIAGNOSIS — I10 Essential (primary) hypertension: Secondary | ICD-10-CM

## 2013-08-15 DIAGNOSIS — E119 Type 2 diabetes mellitus without complications: Secondary | ICD-10-CM

## 2013-08-15 NOTE — Patient Instructions (Addendum)
Your physician recommends that you schedule a follow-up appointment in:  One week with brittany PA for bp check  Stop taking your chlorthaidone

## 2013-08-15 NOTE — Progress Notes (Signed)
Patient ID: Phyllis Conner, female   DOB: 1946/01/07, 68 y.o.   MRN: [id number] 08/15/2013 Phyllis Conner   02-20-1946  [id number]  Primary Physician Elyn Peers, MD Primary Cardiologist: Dr. Ellyn Hack  HPI:  The patient is a 68 y/o AAF followed by Dr. Ellyn Hack with HTN, Type 2 DM, dyslipidemia, and PE in Aug 2013 (presented with syncope then). She was admitted 02/15/13 with a NSTEMI with AL EKG changes. Cath done 02/15/13 revealed LAD and OM1 disease and she underwent intervention with DES placed in both arteries. Her EF was 45-50% by echo. ASA, Brilinta, Lipitor, and Lopressor were added to her home medications.  She presents to clinic today for post-hospital follow-up.  She states that she has been doing well since discharge, denying any further angina or chest pain. No SOB. She reports daily compliance with all of her medications.   She is noted to be borderline hypotensive in clinic today with a blood pressure of 92/55. When asked about symptoms, she states that she does have frequent dizziness while ambulating. However she denies syncope and near-syncope.   Current Outpatient Prescriptions  Medication Sig Dispense Refill  . aspirin EC 81 MG EC tablet Take 1 tablet (81 mg total) by mouth daily.      Marland Kitchen atorvastatin (LIPITOR) 80 MG tablet Take 0.5 tablets (40 mg total) by mouth daily at 6 PM.  90 tablet  3  . chlorthalidone (HYGROTON) 25 MG tablet Take 25 mg by mouth daily.      . insulin aspart (NOVOLOG) 100 UNIT/ML injection 24 hour continuous dose  Abd. dosing      . INVESTIGATIONAL DRUG SIMPLE RECORD [employer] :[phone]  STUDY DRUG  TAKE 1 TABLET TWICE A DAY      . isosorbide mononitrate (IMDUR) 30 MG 24 hr tablet Take 1 tablet (30 mg total) by mouth daily.  90 tablet  3  . losartan (COZAAR) 100 MG tablet Take 100 mg by mouth daily.      . metFORMIN (GLUCOPHAGE) 500 MG tablet Take 1 tablet (500 mg total) by mouth daily.      . metoprolol tartrate (LOPRESSOR) 12.5 mg TABS  tablet Take 0.5 tablets (12.5 mg total) by mouth 2 (two) times daily.  90 tablet  3  . nitroGLYCERIN (NITROSTAT) 0.4 MG SL tablet Place 1 tablet (0.4 mg total) under the tongue every 5 (five) minutes x 3 doses as needed for chest pain.  25 tablet  2  . sitaGLIPtin (JANUVIA) 100 MG tablet Take 100 mg by mouth daily.      . Ticagrelor (BRILINTA) 90 MG TABS tablet Take 1 tablet (90 mg total) by mouth 2 (two) times daily.  60 tablet  11   No current facility-administered medications for this visit.    Allergies  Allergen Reactions  . Iohexol Hives and Other (See Comments)    Excessive sweating    History   Social History  . Marital Status: Single    Spouse Name: N/A    Number of Children: N/A  . Years of Education: N/A   Occupational History  . Not on file.   Social History Main Topics  . Smoking status: Never Smoker   . Smokeless tobacco: Not on file  . Alcohol Use: No  . Drug Use: No  . Sexual Activity: Not on file   Other Topics Concern  . Not on file   Social History Narrative   She is married with 4 children.   She does  not smoke and does not drink alcohol.     Review of Systems: General: negative for chills, fever, night sweats or weight changes.  Cardiovascular: negative for chest pain, dyspnea on exertion, edema, orthopnea, palpitations, paroxysmal nocturnal dyspnea or shortness of breath Dermatological: negative for rash Respiratory: negative for cough or wheezing Urologic: negative for hematuria Abdominal: negative for nausea, vomiting, diarrhea, bright red blood per rectum, melena, or hematemesis Neurologic: negative for visual changes, syncope, or dizziness All other systems reviewed and are otherwise negative except as noted above.    Blood pressure 92/52, height 5\' 4"  (1.626 m), weight 197 lb (89.359 kg).  General appearance: alert, cooperative, no distress and moderately obese Neck: no carotid bruit and no JVD Lungs: clear to auscultation  bilaterally Heart: regular rate and rhythm, S1, S2 normal, no murmur, click, rub or gallop Extremities: no LEE Pulses: 2+ and symmetric Skin: warm and dry Neurologic: Grossly normal  EKG NSR. 66 bpm. No ischemic changes.   ASSESSMENT AND PLAN:   CAD (coronary artery disease)- LAD/OM1 DES 02/17/13 Stable. She denies resting and exertional angina. Will continue dual antiplatelet therapy, with aspirin and Brilinta, for at least another 6 months, to complete her one-year course. Will defer to Dr. Ellyn Hack whether or not she will need to continue dual antiplatelet therapy beyond one year. We will also continue her beta blocker, ARB, nitrate and statin. Fortunately, she has not required use of sublingual nitroglycerin. However, we reviewed the proper use of nitroglycerin, in the event that she develops recurrent angina.   Hypertension Blood pressure today in clinic is borderline hypotensive at 92/52 . She does note a history of frequent dizziness when ambulating. Orthostatics were checked and it appears that she is in fact orthostatic (90/52 supine, 82/62 sitting, 100/70 standing). For now we will discontinue her cloathalazone. We'll also check a CBC to rule out anemia and BMP to assess renal function and to rule out dehydration. She was instructed to stay well hydrated with fluids. She has been instructed to followup in clinic early next week to reassess blood pressure.   T2DM (type 2 diabetes mellitus) She is on metformin therapy. This was followed and managed by her PCP. It has been recommended to her that she try to keep her hemoglobin A1c <7.  Dyslipidemia Lipid panel 6 months ago, at the time of her MI, revealed an elevated LDL of 154. Since that time, she has been on high-dose Lipitor at 80 mg a day. We'll recheck a fasting lipid panel to reassess cholesterol levels and will also check a hepatic panel to check liver function.     PLAN  orthostatic pressures were checked and she does in fact  appear to be orthostatic. I have reviewed her medications and have decided to temporarily discontinue cloathalazone. As she has known CAD and is post NSTEMI, I will like to continue her on her beta blocker, ARB and nitrate, as long as blood pressure allows. I have instructed her to followup in clinic next week to reassess her blood pressure. Today, we will check a CBC to rule out anemia and a BMP to check renal function and to rule out dehydration. She has also been instructed to check her blood pressure over the weekend and to call the own-call provider if she develops any symptoms or has any significant increases in blood pressure after she stops her cloathalazone. She  verbalized understanding and agrees to the above plan. In regards to her CAD, we will continue all meds as previously  prescribed. Will also check fasting lipid panel and hepatic panel.    Brittainy SimmonsPA-C 08/15/2013 7:34 PM

## 2013-08-18 ENCOUNTER — Telehealth: Payer: Self-pay | Admitting: Cardiology

## 2013-08-18 ENCOUNTER — Ambulatory Visit (HOSPITAL_COMMUNITY): Payer: PRIVATE HEALTH INSURANCE

## 2013-08-18 LAB — CBC
HCT: 33.2 % — ABNORMAL LOW (ref 36.0–46.0)
HEMOGLOBIN: 10.8 g/dL — AB (ref 12.0–15.0)
MCH: 28.6 pg (ref 26.0–34.0)
MCHC: 32.5 g/dL (ref 30.0–36.0)
MCV: 88.1 fL (ref 78.0–100.0)
PLATELETS: 191 10*3/uL (ref 150–400)
RBC: 3.77 MIL/uL — ABNORMAL LOW (ref 3.87–5.11)
RDW: 15.8 % — ABNORMAL HIGH (ref 11.5–15.5)
WBC: 6.4 10*3/uL (ref 4.0–10.5)

## 2013-08-18 LAB — COMPREHENSIVE METABOLIC PANEL
ALBUMIN: 4 g/dL (ref 3.5–5.2)
ALK PHOS: 132 U/L — AB (ref 39–117)
ALT: 42 U/L — ABNORMAL HIGH (ref 0–35)
AST: 38 U/L — ABNORMAL HIGH (ref 0–37)
BUN: 25 mg/dL — ABNORMAL HIGH (ref 6–23)
CO2: 24 mEq/L (ref 19–32)
Calcium: 9.7 mg/dL (ref 8.4–10.5)
Chloride: 104 mEq/L (ref 96–112)
Creat: 2.43 mg/dL — ABNORMAL HIGH (ref 0.50–1.10)
GLUCOSE: 97 mg/dL (ref 70–99)
POTASSIUM: 3.5 meq/L (ref 3.5–5.3)
SODIUM: 138 meq/L (ref 135–145)
TOTAL PROTEIN: 7.5 g/dL (ref 6.0–8.3)
Total Bilirubin: 0.8 mg/dL (ref 0.2–1.2)

## 2013-08-18 LAB — LIPID PANEL
Cholesterol: 156 mg/dL (ref 0–200)
HDL: 50 mg/dL (ref >39)
LDL CALC: 88 mg/dL (ref 0–99)
Total CHOL/HDL Ratio: 3.1 Ratio
Triglycerides: 88 mg/dL (ref <150)
VLDL: 18 mg/dL (ref 0–40)

## 2013-08-18 NOTE — Telephone Encounter (Signed)
Call from [facility]. Patient was there to have Lipid panel drawn. They did not have a diagnosis code. Code of 272.4 given to them.

## 2013-08-19 ENCOUNTER — Ambulatory Visit (INDEPENDENT_AMBULATORY_CARE_PROVIDER_SITE_OTHER): Payer: PRIVATE HEALTH INSURANCE | Admitting: Cardiology

## 2013-08-19 VITALS — BP 104/68 | HR 68 | Ht 63.0 in | Wt 197.0 lb

## 2013-08-19 DIAGNOSIS — E785 Hyperlipidemia, unspecified: Secondary | ICD-10-CM

## 2013-08-19 DIAGNOSIS — I951 Orthostatic hypotension: Secondary | ICD-10-CM

## 2013-08-19 DIAGNOSIS — N289 Disorder of kidney and ureter, unspecified: Secondary | ICD-10-CM

## 2013-08-19 DIAGNOSIS — R944 Abnormal results of kidney function studies: Secondary | ICD-10-CM

## 2013-08-19 LAB — COMPLETE METABOLIC PANEL WITH GFR
ALK PHOS: 130 U/L — AB (ref 39–117)
ALT: 41 U/L — ABNORMAL HIGH (ref 0–35)
AST: 37 U/L (ref 0–37)
Albumin: 4 g/dL (ref 3.5–5.2)
BUN: 26 mg/dL — AB (ref 6–23)
CHLORIDE: 105 meq/L (ref 96–112)
CO2: 24 mEq/L (ref 19–32)
Calcium: 9.3 mg/dL (ref 8.4–10.5)
Creat: 2.4 mg/dL — ABNORMAL HIGH (ref 0.50–1.10)
GFR, Est African American: 24 mL/min — ABNORMAL LOW
GFR, Est Non African American: 21 mL/min — ABNORMAL LOW
Glucose, Bld: 88 mg/dL (ref 70–99)
Potassium: 3.3 mEq/L — ABNORMAL LOW (ref 3.5–5.3)
SODIUM: 140 meq/L (ref 135–145)
Total Bilirubin: 0.6 mg/dL (ref 0.2–1.2)
Total Protein: 7.3 g/dL (ref 6.0–8.3)

## 2013-08-19 LAB — CK: CK TOTAL: 165 U/L (ref 7–177)

## 2013-08-19 MED ORDER — ATORVASTATIN CALCIUM 80 MG PO TABS: 40.0000 mg | ORAL_TABLET | Freq: Every day | ORAL | Status: DC

## 2013-08-19 NOTE — Progress Notes (Signed)
Patient ID: Phyllis Conner, female   DOB: Sep 08, 1945, 68 y.o.   MRN: [id number]    08/19/2013 ISAURA Conner   1946/03/30  [id number]  Primary Physicia Elyn Peers, MD Primary Cardiologist: Dr. Ellyn Hack  HPI:  [name] presents back to clinic today for follow-up on her BP. She is a 68 y/o female, followed by Dr. Ellyn Hack. She has a h/o CAD, s/p NSTEMI November 2014 resulting in PCI + DES to both the LAD and OM1, on DAPT with ASA + Brilinta. She has mild systolic dysfunction, with an EF of 45-50% at time of LHC in 2014. She also has h/o HTN, HLD and T2DM.   I evaluated her in clinic for routine f/u 3 days ago. At that time, she denied chest pain and SOB. However, she was noted to be borderline hypotensive with a BP of  92/52. I asked about any symptoms of hypotension and she complained of frequent dizzy spells. We performed an orthostatic vitals check and her BP was as follows: 90/52 supine, 82/62 sitting, 100/70 standing.  I opted to discontinue her chlorthalidone (continued BB, ARB and Imdur given her CAD). I also ordered a CBC to r/o anemia and a CMP to check renal function, over concerns for dehydration, as well as look at electrolytes and hepatic function (had been on 80 mg Lipitor since 02/2013).   She presents back to clinic today. She states that she feels notably better after stopping her chlorthalidone. Her BP sitting today is 104/68 supine, 100/68 sitting, 100/62 standing. Her BMP shows a significant increase in her SCr, compared to last assessment 6 months ago. Her SCr increased from 0.97 to 2.43. BUN was 25. GFR was 24. Also notable was slightly elevated liver enzymes with Alkaline Phos at 132, AST at 38 and ALT at 42. CBC showed anemia (Hgb of 10.8) which appears to be chronic.   After further questioning of her history, she notes that she has had several episodes of diarrhea over the last few days. She states that she drinks about "3 bottles" of water a day. No N/v. She denies any melena  and hematochezia. She is not on any NSAIDs and denies any recent antibiotics.    Current Outpatient Prescriptions  Medication Sig Dispense Refill  . aspirin EC 81 MG EC tablet Take 1 tablet (81 mg total) by mouth daily.      Marland Kitchen atorvastatin (LIPITOR) 80 MG tablet Take 0.5 tablets (40 mg total) by mouth daily at 6 PM.  90 tablet  3  . chlorthalidone (HYGROTON) 25 MG tablet Take 25 mg by mouth daily.      . insulin aspart (NOVOLOG) 100 UNIT/ML injection 24 hour continuous dose  Abd. dosing      . INVESTIGATIONAL DRUG SIMPLE RECORD [employer] :[phone]  STUDY DRUG  TAKE 1 TABLET TWICE A DAY      . isosorbide mononitrate (IMDUR) 30 MG 24 hr tablet Take 1 tablet (30 mg total) by mouth daily.  90 tablet  3  . losartan (COZAAR) 100 MG tablet Take 100 mg by mouth daily.      . metFORMIN (GLUCOPHAGE) 500 MG tablet Take 1 tablet (500 mg total) by mouth daily.      . metoprolol tartrate (LOPRESSOR) 12.5 mg TABS tablet Take 0.5 tablets (12.5 mg total) by mouth 2 (two) times daily.  90 tablet  3  . nitroGLYCERIN (NITROSTAT) 0.4 MG SL tablet Place 1 tablet (0.4 mg total) under the tongue every 5 (five) minutes x 3  doses as needed for chest pain.  25 tablet  2  . sitaGLIPtin (JANUVIA) 100 MG tablet Take 100 mg by mouth daily.      . Ticagrelor (BRILINTA) 90 MG TABS tablet Take 1 tablet (90 mg total) by mouth 2 (two) times daily.  60 tablet  11  . amLODipine (NORVASC) 2.5 MG tablet Take 1 tablet (2.5 mg total) by mouth daily.  180 tablet  3   No current facility-administered medications for this visit.    Allergies  Allergen Reactions  . Iohexol Hives and Other (See Comments)    Excessive sweating    History   Social History  . Marital Status: Single    Spouse Name: N/A    Number of Children: N/A  . Years of Education: N/A   Occupational History  . Not on file.   Social History Main Topics  . Smoking status: Never Smoker   . Smokeless tobacco: Not on file  . Alcohol Use: No  . Drug  Use: No  . Sexual Activity: Not on file   Other Topics Concern  . Not on file   Social History Narrative   She is married with 4 children.   She does not smoke and does not drink alcohol.     Review of Systems: General: negative for chills, fever, night sweats or weight changes.  Cardiovascular: negative for chest pain, dyspnea on exertion, edema, orthopnea, palpitations, paroxysmal nocturnal dyspnea or shortness of breath Dermatological: negative for rash Respiratory: negative for cough or wheezing Urologic: negative for hematuria Abdominal: negative for nausea, vomiting, diarrhea, bright red blood per rectum, melena, or hematemesis Neurologic: negative for visual changes, syncope, or dizziness All other systems reviewed and are otherwise negative except as noted above.    Blood pressure 104/68, pulse 68, height 5\' 3"  (1.6 m), weight 197 lb (89.359 kg).  General appearance: alert, cooperative and no distress Neck: no carotid bruit and no JVD Lungs: clear to auscultation bilaterally Heart: regular rate and rhythm, S1, S2 normal, no murmur, click, rub or gallop Extremities: no LEE Pulses: 2+ and symmetric Skin: warm and dry Neurologic: Grossly normal  EKG not performed  ASSESSMENT AND PLAN:   Orthostatic hypotension Improved. She states that she feels notably better after stopping her chlorthalidone. Her BP sitting today is 104/68 supine, 100/68 sitting, 100/62 standing. Another potential cause may also be dehydration, as her BMP demonstrates a significant increase in her serum creatinine to 2.43. BUN  is 25. This is in the setting of recent diarrhea. Her CBC demonstrates stable anemia, with hemoglobin at 10.8 g/dL (10.6 g/dL 6 months ago). We will continue to hold her chlorthalidone. Patient also instructed to stay well hydrated with fluids particularly water and Gatorade.  Renal insufficiency Serum creatinine has increased to 2.43. Her serum creatinine 6 months ago was 0.97.  Question if this is secondary to dehydration from recent diarrhea. She is not on any NSAIDs and no recent antibiotics. We will continue to hold her chlorthalidone and will also discontinue her losartan. We will continue workup which will include a urinalysis, urine sodium and urine creatinine to calculate FENa, to help determine if the etiology is in fact prerenal or renal, post renal. If the latter 2, will refer her back to her PCP and nephrologist. Will recheck a CMP in 3 days.  Elevated Hepatic Enzymes Slightly elevated liver enzymes with Alkaline Phos at 132, AST at 38 and ALT at 42. She has been on high-dose Lipitor, 80 mg daily, for  over 6 months. Her lipid panel from a week ago demonstrated an LDL of 88, HDL of 50 and triglycerides of 88. Her LDL is significantly improved since her non-STEMI. At that time it was 154. Ideally, considering her history of CAD and MI, I would like for her goal LDL to be less than 70. However, given the slight increase in liver enzymes, will reduce Lipitor to 40 mg daily and will also check a CK. Will recheck CMP in 3 days.   PLAN  Will follow the plan as outlined above. The patient was given instructions on labs to obtain and which medicines to discontinue/decrease. She has been instructed to followup in one week.  Analilia Geddis SimmonsPA-C 08/19/2013 10:40 PM

## 2013-08-19 NOTE — Patient Instructions (Addendum)
Hold losartan (Cozar) until seen back in clinic Get lab work done today and get CMP on  Friday. Follow-up on Monday with Lyda Jester, PA-C Drink plenty of fluids. Drink Gatorade. Reduce Lipitor to 40 mg daily

## 2013-08-20 ENCOUNTER — Ambulatory Visit (HOSPITAL_COMMUNITY): Payer: PRIVATE HEALTH INSURANCE

## 2013-08-20 LAB — URINALYSIS
BILIRUBIN URINE: NEGATIVE
Glucose, UA: NEGATIVE mg/dL
Hgb urine dipstick: NEGATIVE
Ketones, ur: NEGATIVE mg/dL
Nitrite: NEGATIVE
PH: 5 (ref 5.0–8.0)
Protein, ur: NEGATIVE mg/dL
Specific Gravity, Urine: 1.014 (ref 1.005–1.030)
Urobilinogen, UA: 1 mg/dL (ref 0.0–1.0)

## 2013-08-20 LAB — SODIUM, URINE, RANDOM: Sodium, Ur: 47 mEq/L

## 2013-08-20 LAB — CREATININE, URINE, RANDOM: CREATININE, URINE: 243.6 mg/dL

## 2013-08-22 ENCOUNTER — Ambulatory Visit (HOSPITAL_COMMUNITY): Payer: PRIVATE HEALTH INSURANCE

## 2013-08-22 ENCOUNTER — Encounter: Payer: Self-pay | Admitting: Cardiology

## 2013-08-25 ENCOUNTER — Ambulatory Visit (INDEPENDENT_AMBULATORY_CARE_PROVIDER_SITE_OTHER): Payer: PRIVATE HEALTH INSURANCE | Admitting: Cardiology

## 2013-08-25 ENCOUNTER — Ambulatory Visit (HOSPITAL_COMMUNITY): Payer: PRIVATE HEALTH INSURANCE

## 2013-08-25 VITALS — BP 140/82 | HR 64 | Ht 63.0 in | Wt 202.0 lb

## 2013-08-25 DIAGNOSIS — N289 Disorder of kidney and ureter, unspecified: Secondary | ICD-10-CM

## 2013-08-25 LAB — COMPREHENSIVE METABOLIC PANEL
ALBUMIN: 3.9 g/dL (ref 3.5–5.2)
ALK PHOS: 102 U/L (ref 39–117)
ALT: 33 U/L (ref 0–35)
AST: 26 U/L (ref 0–37)
BUN: 14 mg/dL (ref 6–23)
CALCIUM: 9.4 mg/dL (ref 8.4–10.5)
CHLORIDE: 107 meq/L (ref 96–112)
CO2: 26 mEq/L (ref 19–32)
Creat: 1.27 mg/dL — ABNORMAL HIGH (ref 0.50–1.10)
Glucose, Bld: 75 mg/dL (ref 70–99)
Potassium: 3.5 mEq/L (ref 3.5–5.3)
SODIUM: 142 meq/L (ref 135–145)
TOTAL PROTEIN: 6.7 g/dL (ref 6.0–8.3)
Total Bilirubin: 0.5 mg/dL (ref 0.2–1.2)

## 2013-08-25 MED ORDER — AMLODIPINE BESYLATE 2.5 MG PO TABS: 2.5000 mg | ORAL_TABLET | Freq: Every day | ORAL | Status: DC

## 2013-08-25 NOTE — Progress Notes (Signed)
Patient ID: Phyllis Conner, female   DOB: 17-Jan-1946, 68 y.o.   MRN: [id number]    08/25/2013 Phyllis Conner   07/19/1945  [id number]  Primary Physicia Phyllis Peers, MD Primary Cardiologist: Dr. Ellyn Hack  Phyllis Conner returns to clinic for the third time in 2 weeks to reassess her blood pressure and to also followup on recent abnormal labs. Details of her past medical history along with history of present illness are outlined below.  HPI:  The patient is a 68 y/o female, followed by Dr. Ellyn Hack. She has a h/o CAD, s/p NSTEMI in November 2014 resulting in PCI + DES to both the LAD and OM1, on DAPT with ASA + Brilinta. She had mild systolic dysfunction, with an EF of 45-50% at time of LHC in 2014. She also has h/o HTN, HLD and T2DM.   I evaluated her in clinic for routine f/u on 08/15/13. At that time, she denied chest pain and SOB. However, she was noted to be borderline hypotensive with a BP of 92/52. I asked about any symptoms of hypotension and she complained of frequent dizzy spells. We performed an orthostatic vitals check and her BP was as follows: 82/62 supine, 90/52 sitting, 100/70 standing. I opted to discontinue her chlorthalidone (continued BB, ARB and Imdur given her CAD). I also ordered a CBC to r/o anemia and a CMP to check renal function, over concerns for dehydration, as well as look at electrolytes and hepatic function (had been on 80 mg Lipitor since 02/2013).   I re-evaluated her several days later on 08/19/13. She stated that she felt notably better after stopping her chlorthalidone. Her BP had improved and she appeared to be no longer orthostatic. Her BP readings were 104/68 supine, 100/68 sitting, 100/62 standing. Her BMP however showed a significant increase in her SCr, compared to last assessment 6 months ago. Her SCr increased from 0.97 to 2.43. BUN was 25. GFR was 24. Also notable was slightly elevated liver enzymes with Alkaline Phos at 132, AST at 38 and ALT at 42. CBC showed  anemia (Hgb of 10.8) which appears to be chronic.   After further questioning of her history, she noted that she had had several episodes of diarrhea over the last few days. She stated that she drinks about "3 bottles" of water a day. No N/v. She denied any melena and hematochezia. She denied NSAID use any recent antibiotics.   Based on her lab results, I instructed her to continue to hold her chlorthalidone and also instructed her to stop her losartan. I also ordered a urinalysis, a urine sodium and urine creatinine to calculate enough, to help determine if the etiology was in fact prerenal or renal, post renal. She was also instructed to decrease her Lipitor to 40 mg daily, in the setting of slightly elevated liver enzymes.   She presents back to clinic today for followup. Her urinalysis only demonstrated a small amount of leukocytes but was otherwise unremarkable. Urine sodium was 47 mEq per liter. Urine creatinine was 243.6 mg/dL. FENa is calculated as less than 1% at 0.3%. She was instructed to get her CMP 3 days after stopping her losartan and reducing her Lipitor but unfortunately she misunderstood and got this lab work done after leaving her office visit. Thus, we are unsure if her renal and hepatic function has significantly improved and we will need to reorder a CMP today.   Her BP today, now that she is off chlorthalidone and losartan, is 142/82.  Current Outpatient Prescriptions  Medication Sig Dispense Refill  . aspirin EC 81 MG EC tablet Take 1 tablet (81 mg total) by mouth daily.      Marland Kitchen atorvastatin (LIPITOR) 80 MG tablet Take 0.5 tablets (40 mg total) by mouth daily at 6 PM.  90 tablet  3  . chlorthalidone (HYGROTON) 25 MG tablet Take 25 mg by mouth daily.      . insulin aspart (NOVOLOG) 100 UNIT/ML injection 24 hour continuous dose  Abd. dosing      . INVESTIGATIONAL DRUG SIMPLE RECORD [employer] :[phone]  STUDY DRUG  TAKE 1 TABLET TWICE A DAY      . isosorbide mononitrate  (IMDUR) 30 MG 24 hr tablet Take 1 tablet (30 mg total) by mouth daily.  90 tablet  3  . losartan (COZAAR) 100 MG tablet Take 100 mg by mouth daily.      . metFORMIN (GLUCOPHAGE) 500 MG tablet Take 1 tablet (500 mg total) by mouth daily.      . metoprolol tartrate (LOPRESSOR) 12.5 mg TABS tablet Take 0.5 tablets (12.5 mg total) by mouth 2 (two) times daily.  90 tablet  3  . nitroGLYCERIN (NITROSTAT) 0.4 MG SL tablet Place 1 tablet (0.4 mg total) under the tongue every 5 (five) minutes x 3 doses as needed for chest pain.  25 tablet  2  . sitaGLIPtin (JANUVIA) 100 MG tablet Take 100 mg by mouth daily.      . Ticagrelor (BRILINTA) 90 MG TABS tablet Take 1 tablet (90 mg total) by mouth 2 (two) times daily.  60 tablet  11  . amLODipine (NORVASC) 2.5 MG tablet Take 1 tablet (2.5 mg total) by mouth daily.  180 tablet  3   No current facility-administered medications for this visit.    Allergies  Allergen Reactions  . Iohexol Hives and Other (See Comments)    Excessive sweating    History   Social History  . Marital Status: Single    Spouse Name: N/A    Number of Children: N/A  . Years of Education: N/A   Occupational History  . Not on file.   Social History Main Topics  . Smoking status: Never Smoker   . Smokeless tobacco: Not on file  . Alcohol Use: No  . Drug Use: No  . Sexual Activity: Not on file   Other Topics Concern  . Not on file   Social History Narrative   She is married with 4 children.   She does not smoke and does not drink alcohol.     Review of Systems: General: negative for chills, fever, night sweats or weight changes.  Cardiovascular: negative for chest pain, dyspnea on exertion, edema, orthopnea, palpitations, paroxysmal nocturnal dyspnea or shortness of breath Dermatological: negative for rash Respiratory: negative for cough or wheezing Urologic: negative for hematuria Abdominal: negative for nausea, vomiting, diarrhea, bright red blood per rectum,  melena, or hematemesis Neurologic: negative for visual changes, syncope, or dizziness All other systems reviewed and are otherwise negative except as noted above.    Blood pressure 140/82, pulse 64, height 5\' 3"  (1.6 m), weight 202 lb (91.627 kg).  General appearance: alert, cooperative and no distress Lungs: clear to auscultation bilaterally Heart: regular rate and rhythm, S1, S2 normal, no murmur, click, rub or gallop Extremities: no LEE Pulses: 2+ and symmetric Skin: warm and dry Neurologic: Grossly normal  EKG not performed  ASSESSMENT AND PLAN:    Her hypotension has resolved with medication adjustments and  remains stable. However, in the setting of diabetes her ideal goal is 130/80 or less. Her blood pressure today is slightly elevated at 140/82. Since her thiazide diuretic and ARB have been temporarily discontinued due to abnormal renal function, she will need additional coverage to insure that she does not become too hypertensive. Her heart rate is in the low 60s, which will limit further titration of her beta blocker. Will add low-dose amlodipine at a dose of 2.5 mg daily for additional coverage. Will  recheck a CMP to see if renal function has improved after hydration with increased PO fluid intake, as well as discontinuation of thiazide diuretic and ARB. We'll also check to see if hepatic function has improved with decreased dose of Lipitor. If liver function remains abnormal, we will discontinue her statin medication. If her renal function remains abnormal, I will defer further workup to her PCP and refer to a nephrologist. She has been instructed to followup with Dr. Ellyn Hack in 4 weeks for reassessment.  Ed Rayson SimmonsPA-C 08/25/2013 8:02 PM

## 2013-08-25 NOTE — Patient Instructions (Signed)
Your physician recommends that you have lab work today  Start Amlodipine 2.5mg  Daily.  Follow up with Dr.Harding in 4-6 weeks.

## 2013-08-27 ENCOUNTER — Ambulatory Visit (HOSPITAL_COMMUNITY): Payer: PRIVATE HEALTH INSURANCE

## 2013-08-28 ENCOUNTER — Encounter: Payer: Self-pay | Admitting: Cardiology

## 2013-08-28 DIAGNOSIS — I951 Orthostatic hypotension: Secondary | ICD-10-CM | POA: Insufficient documentation

## 2013-08-29 ENCOUNTER — Inpatient Hospital Stay (HOSPITAL_COMMUNITY): Admission: RE | Admit: 2013-08-29 | Payer: PRIVATE HEALTH INSURANCE | Source: Ambulatory Visit

## 2013-08-29 ENCOUNTER — Encounter: Payer: Self-pay | Admitting: Cardiology

## 2013-09-01 ENCOUNTER — Ambulatory Visit (HOSPITAL_COMMUNITY): Payer: PRIVATE HEALTH INSURANCE

## 2013-09-03 ENCOUNTER — Ambulatory Visit (HOSPITAL_COMMUNITY): Payer: PRIVATE HEALTH INSURANCE

## 2013-09-05 ENCOUNTER — Ambulatory Visit (HOSPITAL_COMMUNITY): Payer: PRIVATE HEALTH INSURANCE

## 2013-10-06 ENCOUNTER — Telehealth: Payer: Self-pay | Admitting: Cardiology

## 2013-10-06 ENCOUNTER — Ambulatory Visit: Payer: PRIVATE HEALTH INSURANCE | Admitting: Cardiology

## 2013-10-08 NOTE — Telephone Encounter (Signed)
Closed encounter °

## 2013-10-15 ENCOUNTER — Encounter: Payer: Self-pay | Admitting: Cardiology

## 2013-10-15 ENCOUNTER — Ambulatory Visit (INDEPENDENT_AMBULATORY_CARE_PROVIDER_SITE_OTHER): Payer: PRIVATE HEALTH INSURANCE | Admitting: Cardiology

## 2013-10-15 VITALS — BP 158/88 | HR 68 | Ht 64.0 in | Wt 195.7 lb

## 2013-10-15 DIAGNOSIS — R0989 Other specified symptoms and signs involving the circulatory and respiratory systems: Secondary | ICD-10-CM

## 2013-10-15 DIAGNOSIS — I10 Essential (primary) hypertension: Secondary | ICD-10-CM

## 2013-10-15 DIAGNOSIS — E785 Hyperlipidemia, unspecified: Secondary | ICD-10-CM

## 2013-10-15 DIAGNOSIS — E669 Obesity, unspecified: Secondary | ICD-10-CM

## 2013-10-15 DIAGNOSIS — Z9861 Coronary angioplasty status: Secondary | ICD-10-CM

## 2013-10-15 DIAGNOSIS — I951 Orthostatic hypotension: Secondary | ICD-10-CM

## 2013-10-15 DIAGNOSIS — E1169 Type 2 diabetes mellitus with other specified complication: Secondary | ICD-10-CM

## 2013-10-15 DIAGNOSIS — N289 Disorder of kidney and ureter, unspecified: Secondary | ICD-10-CM

## 2013-10-15 DIAGNOSIS — I2510 Atherosclerotic heart disease of native coronary artery without angina pectoris: Secondary | ICD-10-CM

## 2013-10-15 DIAGNOSIS — E119 Type 2 diabetes mellitus without complications: Secondary | ICD-10-CM

## 2013-10-15 MED ORDER — AMLODIPINE BESYLATE 5 MG PO TABS: 5.0000 mg | ORAL_TABLET | Freq: Every day | ORAL | Status: DC

## 2013-10-15 NOTE — Patient Instructions (Addendum)
Your physician has requested that you have a carotid duplex. This test is an ultrasound of the carotid arteries in your neck. It looks at blood flow through these arteries that supply the brain with blood. Allow one hour for this exam. There are no restrictions or special instructions.  Increase AMLODIPINE TO 5 MG ONE TABLET DAILY.---you may take 2 of the 2.5 mg daily until the bottle is empty.  YOUR PRIMARY CAN FOLLOW UP WITH YOUR CHOLESTEROL.  Your physician wants you to follow-up in [place].  You will receive a reminder letter in the mail two months in advance. If you don't receive a letter, please call our office to schedule the follow-up appointment.

## 2013-10-17 ENCOUNTER — Ambulatory Visit (HOSPITAL_COMMUNITY)
Admission: RE | Admit: 2013-10-17 | Discharge: 2013-10-17 | Disposition: A | Discharge status: Home / Self Care | Payer: PRIVATE HEALTH INSURANCE | Source: Ambulatory Visit | Attending: Cardiovascular Disease | Admitting: Cardiovascular Disease

## 2013-10-17 DIAGNOSIS — R0989 Other specified symptoms and signs involving the circulatory and respiratory systems: Secondary | ICD-10-CM

## 2013-10-17 NOTE — Progress Notes (Signed)
Carotid Duplex Completed. °Brianna L Mazza,RVT °

## 2013-10-20 NOTE — Progress Notes (Signed)
PCP: Elyn Peers, MD  Clinic Note: Chief Complaint  Patient presents with  . 6 WEEK VISIT    RECENT LABS,MEDICATION ADDED ON 08/2013-AMLODIPINE, NO CHEST PAI N,LITTLE SOB , LITTLE  EDEMA    HPI: Phyllis Conner is a 68 y.o. female with a Cardiovascular Problem List below who presents today for ~2 month f/u from recent 2 visits with Lana Fish on 5/8, 12 & 5/18.  5/8 - Borderline Hypotension & dizziness; d/c chlorthalidone -- labs ordered; Acute Renal Insufficiency - Cr 2.43K 3.5, H/H 10.8/33.2; TC 156, TG 88, HDL 50, LDL 88 (close to goal); All PHos 132, AST/ALT 38/42  5/12 - feeling better, not as hypotensive & not orthostatic; pt remembered having had diarrhea (drinks 3 bottles of H20 daily); b/c elevated Cr, ARB d/c'd ; recheck Cr on 5/12 2.4; Lipitor reduced to 40 mg  5/18: Cr back to 1.27 & LFTs normal.; BP better & no longer hypotensive. - low dose Amlodipine 2.5 mg started  Interval History: Since her most recent visit with Ellen Henri, PA, she has been doing relatively well. No morbid GI upset as she had before. No more diarrhea. She does note a lobe of exertional dyspnea which is felt to be chronic for her, and no worse than before. She denies any exertional chest tightness or pressure. No resting chest tightness or dyspnea. Intermittent mild palpitations and mild edema but otherwise no rapid irregular heartbeats, TAH/amaurosis fugax or syncope/near-syncope. She denies any lightheadedness or dizziness. No melena, hematochezia, hematuria, or epistaxis. She's not had any further orthostatic symptoms and has been doing fine since the increased dose of her amlodipine.  Past Medical History  Diagnosis Date  . Diabetes mellitus type 2 in obese   . Essential hypertension   . Hyperlipidemia with target LDL less than 70   . Non-Q wave ST elevation myocardial infarction (STEMI) involving left anterior descending (LAD) coronary artery November 2014    PCI to LAD and  circumflex  . CAD S/P percutaneous coronary angioplasty November 2014    Mid LAD 2.5 mm x 12 mm Promus DES (2.70 mm); proximal OM1 - Promus DES 2.75 mm x 16 mm (2.85 mm)  . Obesity (BMI 30-39.9) 05/05/2013    Prior Cardiac Evaluation and Past Surgical History: Past Surgical History  Procedure Laterality Date  . Cardiac catheterization  November 2014    Mid LAD 95%, proximal OM1 80-90% --> two-vessel PCI  . Percutaneous coronary stent intervention (pci-s)  November 2014    Mid LAD 2.5 mm x 12 mm (2.75 mm) Promus DES; OM1 2.75 mm x 60 mm (2.85 mm) Promus DES  . Transthoracic echocardiogram  02/16/2013    EF 40-50%. Moderate concentric LVH. Moderate H. Shea of mid-distal septal and apical region. Grade 1 diastolic dysfunction. Moderate aortic sclerosis without stenosis.    MEDICATIONS AND ALLERGIES REVIEWED IN EPIC No Change in Social and Family History  ROS: A comprehensive Review of Systems - Negative except Symptoms are in history of present illness. Essentially doing much better than before.  Wt Readings from Last 3 Encounters:  10/15/13 195 lb 11.2 oz (88.769 kg)  08/25/13 202 lb (91.627 kg)  08/19/13 197 lb (89.359 kg)    PHYSICAL EXAM BP 158/88  Pulse 68  Ht 5\' 4"  (1.626 m)  Wt 195 lb 11.2 oz (88.769 kg)  BMI 33.58 kg/m2 General appearance: alert, cooperative, appears stated age, no distress, moderately obese and well-nourished and well-groomed. Normal moood and affect.  Neck: no adenopathy, soft  R sided carotid bruit, no JVD, supple, symmetrical, trachea midline and thyroid not enlarged, symmetric, no tenderness/mass/nodules  Lungs: CTAB, normal percussion bilaterally and Nonlabored, good air movement  Heart: regular rate and rhythm, S1, S2 normal, no murmur, click, rub or gallop and normal apical impulse  Abdomen: soft, non-tender; bowel sounds normal; no masses, no HSM; Moderate to severely obese (truncal obesity)  Extremities: extremities normal, atraumatic, no cyanosis  or edema and Mild bruising  Pulses: 2+ and symmetric  Neurologic: Alert and oriented X 3, normal strength and tone. Normal symmetric reflexes. Normal coordination and gait   Adult ECG Report - not done  Recent Labs:  May 18: -- BUN/Cr 14/1.27 (down from 26/2.4); potassium 3.5. Normal LFTs  Lipid panel from 08/19/2013: TC 156, TG 80, HDL 50, LDL 88 (notable improvement from November with TC 239, HDL 58, LDL 158 and TG 137 close  ASSESSMENT / PLAN: Renal insufficiency Recovered from her subacute renal insufficiency with a creatinine back down to 1.27. Essentially back to normal GFR for her weight.  CAD S/P percutaneous coronary angioplasty -- LAD/OM1 DES 02/17/13 No active symptoms. Is still within the first year of her 2 DES stents. Continues to be on aspirin plus Alimta without any bleeding issues. He is now down to 40 mg Lipitor statin along with stable dose of beta blocker, ARB and Imdur.  Essential hypertension Blood pressure is now back to being elevated. Will increase amlodipine to 5 mg. With her heart rate is 68 and a history of orthostatic symptoms we will leave her beta blocker alone as it is for now.  Orthostatic hypotension Not currently issue. I did tell her that she is feeling poorly, and dehydrated she should simply just hold her amlodipine or losartan to allow for more blood pressure room.   Hyperlipidemia with target LDL less than 70 Notable improvement in labs from November to May. Can followup with PCP to check labs, but easily wait 6 months. Is currently on 40 mg of atorvastatin.  Obesity (BMI 30-39.9) She lost back the weight she gained in May, and is still actively trying to become more exercise conscience and monitor her diet. Again I reviewed her radius starting off slowly maybe 10-15 minutes a day increasing to 5 minutes each time she goes out in the next week getting released with a 30 minute walk and increase the speed.   Right carotid bruit I have not  previously heard a bruit, it may have been that she was breathing while I was auscultating. We'll check carotid Doppler to clarify.    Orders Placed This Encounter  Procedures  . Doppler carotid    Dx carotid bruits    Standing Status: Future     Number of Occurrences: 1     Standing Expiration Date: 10/15/2014    Order Specific Question:  Laterality    Answer:  Bilateral    Order Specific Question:  Where should this test be performed:    Answer:  [facility]   Meds ordered this encounter  Medications  . amLODipine (NORVASC) 5 MG tablet    Sig: Take 1 tablet (5 mg total) by mouth daily.    Dispense:  180 tablet    Refill:  3    Please place on file for patient    Followup: 6 months  Kavaughn Faucett W. Ellyn Hack, M.D., M.S. Interventional Cardiologist [facility]   At the time of this note - Carotid Dopplers have been performed.  No significant stenosis.

## 2013-10-22 ENCOUNTER — Telehealth: Payer: Self-pay | Admitting: *Deleted

## 2013-10-22 NOTE — Telephone Encounter (Signed)
Normal Doppler results given.  Patient voiced understanding.

## 2013-10-22 NOTE — Telephone Encounter (Signed)
Message copied by Tressa Busman on Wed Oct 22, 2013  9:25 AM ------      Message from: Leonie Man      Created: Tue Oct 21, 2013 11:39 PM       Normal doppler study - no sign of blockages.            Leonie Man, MD       ------

## 2013-10-25 ENCOUNTER — Encounter: Payer: Self-pay | Admitting: Cardiology

## 2013-10-25 DIAGNOSIS — R0989 Other specified symptoms and signs involving the circulatory and respiratory systems: Secondary | ICD-10-CM | POA: Insufficient documentation

## 2013-10-25 DIAGNOSIS — E669 Obesity, unspecified: Secondary | ICD-10-CM

## 2013-10-25 DIAGNOSIS — E785 Hyperlipidemia, unspecified: Secondary | ICD-10-CM | POA: Insufficient documentation

## 2013-10-25 DIAGNOSIS — E1169 Type 2 diabetes mellitus with other specified complication: Secondary | ICD-10-CM | POA: Insufficient documentation

## 2013-10-25 NOTE — Assessment & Plan Note (Signed)
Recovered from her subacute renal insufficiency with a creatinine back down to 1.27. Essentially back to normal GFR for her weight.

## 2013-10-25 NOTE — Assessment & Plan Note (Signed)
Not currently issue. I did tell her that she is feeling poorly, and dehydrated she should simply just hold her amlodipine or losartan to allow for more blood pressure room.

## 2013-10-25 NOTE — Assessment & Plan Note (Signed)
Blood pressure is now back to being elevated. Will increase amlodipine to 5 mg. With her heart rate is 68 and a history of orthostatic symptoms we will leave her beta blocker alone as it is for now.

## 2013-10-25 NOTE — Assessment & Plan Note (Signed)
I have not previously heard a bruit, it may have been that she was breathing while I was auscultating. We'll check carotid Doppler to clarify.

## 2013-10-25 NOTE — Assessment & Plan Note (Signed)
No active symptoms. Is still within the first year of her 2 DES stents. Continues to be on aspirin plus Alimta without any bleeding issues. He is now down to 40 mg Lipitor statin along with stable dose of beta blocker, ARB and Imdur.

## 2013-10-25 NOTE — Assessment & Plan Note (Signed)
She lost back the weight she gained in May, and is still actively trying to become more exercise conscience and monitor her diet. Again I reviewed her radius starting off slowly maybe 10-15 minutes a day increasing to 5 minutes each time she goes out in the next week getting released with a 30 minute walk and increase the speed.

## 2013-10-25 NOTE — Assessment & Plan Note (Signed)
Notable improvement in labs from November to May. Can followup with PCP to check labs, but easily wait 6 months. Is currently on 40 mg of atorvastatin.

## 2013-10-28 ENCOUNTER — Telehealth: Payer: Self-pay | Admitting: Cardiology

## 2013-10-28 NOTE — Telephone Encounter (Signed)
Returned call to patient Amlodipine was started 08/25/13.

## 2013-10-28 NOTE — Telephone Encounter (Signed)
Pt says she in a research study. She needs to know when she started taking Amlodipine?

## 2013-11-24 ENCOUNTER — Other Ambulatory Visit: Payer: Self-pay | Admitting: Gastroenterology

## 2013-12-05 ENCOUNTER — Encounter (HOSPITAL_COMMUNITY): Payer: Self-pay | Admitting: *Deleted

## 2013-12-05 ENCOUNTER — Ambulatory Visit (HOSPITAL_COMMUNITY)
Admission: RE | Admit: 2013-12-05 | Discharge: 2013-12-05 | Disposition: A | Discharge status: Home / Self Care | Payer: PRIVATE HEALTH INSURANCE | Source: Ambulatory Visit | Attending: Gastroenterology | Admitting: Gastroenterology

## 2013-12-05 ENCOUNTER — Encounter (HOSPITAL_COMMUNITY)
Admission: RE | Disposition: A | Discharge status: Home / Self Care | Payer: PRIVATE HEALTH INSURANCE | Source: Ambulatory Visit | Attending: Gastroenterology

## 2013-12-05 DIAGNOSIS — D509 Iron deficiency anemia, unspecified: Secondary | ICD-10-CM | POA: Insufficient documentation

## 2013-12-05 DIAGNOSIS — Z90710 Acquired absence of both cervix and uterus: Secondary | ICD-10-CM | POA: Diagnosis not present

## 2013-12-05 DIAGNOSIS — E119 Type 2 diabetes mellitus without complications: Secondary | ICD-10-CM | POA: Diagnosis not present

## 2013-12-05 DIAGNOSIS — D126 Benign neoplasm of colon, unspecified: Secondary | ICD-10-CM | POA: Insufficient documentation

## 2013-12-05 DIAGNOSIS — K5730 Diverticulosis of large intestine without perforation or abscess without bleeding: Secondary | ICD-10-CM | POA: Diagnosis not present

## 2013-12-05 DIAGNOSIS — I2510 Atherosclerotic heart disease of native coronary artery without angina pectoris: Secondary | ICD-10-CM | POA: Diagnosis not present

## 2013-12-05 DIAGNOSIS — Z9861 Coronary angioplasty status: Secondary | ICD-10-CM | POA: Diagnosis not present

## 2013-12-05 DIAGNOSIS — K2970 Gastritis, unspecified, without bleeding: Secondary | ICD-10-CM | POA: Insufficient documentation

## 2013-12-05 DIAGNOSIS — Z6839 Body mass index (BMI) 39.0-39.9, adult: Secondary | ICD-10-CM | POA: Diagnosis not present

## 2013-12-05 DIAGNOSIS — E669 Obesity, unspecified: Secondary | ICD-10-CM | POA: Diagnosis not present

## 2013-12-05 DIAGNOSIS — K2990 Gastroduodenitis, unspecified, without bleeding: Secondary | ICD-10-CM

## 2013-12-05 HISTORY — PX: ESOPHAGOGASTRODUODENOSCOPY: SHX5428

## 2013-12-05 HISTORY — PX: COLONOSCOPY: SHX5424

## 2013-12-05 LAB — GLUCOSE, CAPILLARY: Glucose-Capillary: 70 mg/dL (ref 70–99)

## 2013-12-05 SURGERY — EGD (ESOPHAGOGASTRODUODENOSCOPY)
Anesthesia: Moderate Sedation

## 2013-12-05 MED ORDER — SODIUM CHLORIDE 0.9 % IV SOLN
INTRAVENOUS | Status: DC
  Administered 2013-12-05: 500 mL via INTRAVENOUS

## 2013-12-05 MED ORDER — DIPHENHYDRAMINE HCL 50 MG/ML IJ SOLN
INTRAMUSCULAR | Status: AC
  Filled 2013-12-05: qty 1

## 2013-12-05 MED ORDER — BUTAMBEN-TETRACAINE-BENZOCAINE 2-2-14 % EX AERO
INHALATION_SPRAY | CUTANEOUS | Status: DC | PRN
  Administered 2013-12-05: 2 via TOPICAL

## 2013-12-05 MED ORDER — MIDAZOLAM HCL 10 MG/2ML IJ SOLN
INTRAMUSCULAR | Status: AC
  Filled 2013-12-05: qty 4

## 2013-12-05 MED ORDER — MIDAZOLAM HCL 10 MG/2ML IJ SOLN
INTRAMUSCULAR | Status: DC | PRN
  Administered 2013-12-05 (×3): 2 mg via INTRAVENOUS
  Administered 2013-12-05: 2.5 mg via INTRAVENOUS

## 2013-12-05 MED ORDER — FENTANYL CITRATE 0.05 MG/ML IJ SOLN
INTRAMUSCULAR | Status: AC
  Filled 2013-12-05: qty 4

## 2013-12-05 MED ORDER — FENTANYL CITRATE 0.05 MG/ML IJ SOLN
INTRAMUSCULAR | Status: DC | PRN
  Administered 2013-12-05 (×4): 25 ug via INTRAVENOUS

## 2013-12-05 NOTE — Discharge Instructions (Signed)
Esophagogastroduodenoscopy °Care After °Refer to this sheet in the next few weeks. These instructions provide you with information on caring for yourself after your procedure. Your caregiver may also give you more specific instructions. Your treatment has been planned according to current medical practices, but problems sometimes occur. Call your caregiver if you have any problems or questions after your procedure.  °HOME CARE INSTRUCTIONS °· Do not eat or drink anything until the numbing medicine (local anesthetic) has worn off and your gag reflex has returned. You will know that the local anesthetic has worn off when you can swallow comfortably. °· Do not drive for 12 hours after the procedure or as directed by your caregiver. °· Only take medicines as directed by your caregiver. °SEEK MEDICAL CARE IF:  °· You cannot stop coughing. °· You are not urinating at all or less than usual. °SEEK IMMEDIATE MEDICAL CARE IF: °· You have difficulty swallowing. °· You cannot eat or drink. °· You have worsening throat or chest pain. °· You have dizziness, lightheadedness, or you faint. °· You have nausea or vomiting. °· You have chills. °· You have a fever. °· You have severe abdominal pain. °· You have black, tarry, or bloody stools. °Document Released: 03/13/2012 Document Reviewed: 03/13/2012 °ExitCare® Patient Information ©2015 [employer]. This information is not intended to replace advice given to you by your health care provider. Make sure you discuss any questions you have with your health care provider. ° °Colonoscopy, Care After °Refer to this sheet in the next few weeks. These instructions provide you with information on caring for yourself after your procedure. Your health care provider may also give you more specific instructions. Your treatment has been planned according to current medical practices, but problems sometimes occur. Call your health care provider if you have any problems or questions after your  procedure. °WHAT TO EXPECT AFTER THE PROCEDURE  °After your procedure, it is typical to have the following: °· A small amount of blood in your stool. °· Moderate amounts of gas and mild abdominal cramping or bloating. °HOME CARE INSTRUCTIONS °· Do not drive, operate machinery, or sign important documents for 24 hours. °· You may shower and resume your regular physical activities, but move at a slower pace for the first 24 hours. °· Take frequent rest periods for the first 24 hours. °· Walk around or put a warm pack on your abdomen to help reduce abdominal cramping and bloating. °· Drink enough fluids to keep your urine clear or pale yellow. °· You may resume your normal diet as instructed by your health care provider. Avoid heavy or fried foods that are hard to digest. °· Avoid drinking alcohol for 24 hours or as instructed by your health care provider. °· Only take over-the-counter or prescription medicines as directed by your health care provider. °· If a tissue sample (biopsy) was taken during your procedure: °¨ Do not take aspirin or blood thinners for 7 days, or as instructed by your health care provider. °¨ Do not drink alcohol for 7 days, or as instructed by your health care provider. °¨ Eat soft foods for the first 24 hours. °SEEK MEDICAL CARE IF: °You have persistent spotting of blood in your stool 2-3 days after the procedure. °SEEK IMMEDIATE MEDICAL CARE IF: °· You have more than a small spotting of blood in your stool. °· You pass large blood clots in your stool. °· Your abdomen is swollen (distended). °· You have nausea or vomiting. °· You have a   fever. °· You have increasing abdominal pain that is not relieved with medicine. °Document Released: 11/09/2003 Document Revised: 01/15/2013 Document Reviewed: 12/02/2012 °ExitCare® Patient Information ©2015 [employer]. This information is not intended to replace advice given to you by your health care provider. Make sure you discuss any questions you have  with your health care provider. ° ° ° °

## 2013-12-05 NOTE — H&P (Signed)
   Phyllis Conner HPI: This is a 68 year old female with progressive anemia while on Brilinta.  She had an MI last year with two DES placed.  She is here for further evaluation of her anemia.  Past Medical History  Diagnosis Date  . Diabetes mellitus type 2 in obese   . Essential hypertension   . Hyperlipidemia with target LDL less than 70   . Non-Q wave ST elevation myocardial infarction (STEMI) involving left anterior descending (LAD) coronary artery November 2014    PCI to LAD and circumflex  . CAD S/P percutaneous coronary angioplasty November 2014    Mid LAD 2.5 mm x 12 mm Promus DES (2.70 mm); proximal OM1 - Promus DES 2.75 mm x 16 mm (2.85 mm)  . Obesity (BMI 30-39.9) 05/05/2013    Past Surgical History  Procedure Laterality Date  . Abdominal hysterectomy  1990s  . Arthroscopic knee surgery Bilateral 1984, 2002    Arthroscopic  . Cardiac catheterization  November 2014    Mid LAD 95%, proximal OM1 80-90% --> two-vessel PCI  . Percutaneous coronary stent intervention (pci-s)  November 2014    Mid LAD 2.5 mm x 12 mm (2.75 mm) Promus DES; OM1 2.75 mm x 60 mm (2.85 mm) Promus DES  . Transthoracic echocardiogram  02/16/2013    EF 40-50%. Moderate concentric LVH. Moderate H. Shea of mid-distal septal and apical region. Grade 1 diastolic dysfunction. Moderate aortic sclerosis without stenosis.    No family history on file.  Social History:  reports that she has never smoked. She does not have any smokeless tobacco history on file. She reports that she does not drink alcohol or use illicit drugs.  Allergies:  Allergies  Allergen Reactions  . Iohexol Hives and Other (See Comments)    Excessive sweating    Medications: Scheduled: Continuous:  No results found for this or any previous visit (from the past 24 hour(s)).   No results found.  ROS:  As stated above in the HPI otherwise negative.  There were no vitals taken for this visit.    PE: Gen: NAD, Alert and  Oriented HEENT:  [place]/AT, EOMI Neck: Supple, no LAD Lungs: CTA Bilaterally CV: RRR without M/G/R ABM: Soft, NTND, +BS Ext: No C/C/E  Assessment/Plan: 1) Anemia. 2) DES on Brilinta.   The plan is to perform the EGD/Colonoscopy on anticoagulation.  She cannot be stopped until 02/2014, but she has progressive anemia that needs to be worked up.  Plan: 1) EGD/Colonoscopy.  Gurpreet Mikhail D 12/05/2013, 7:32 AM

## 2013-12-05 NOTE — Op Note (Signed)
[facility] [place], [address]   OPERATIVE PROCEDURE REPORT  PATIENT: Phyllis Conner, Phyllis Conner  MR#: [id number] BIRTHDATE: 24-Nov-1945  GENDER: Female ENDOSCOPIST: Carol Ada, MD ASSISTANT:   Jiles Harold, technician Luanne Bras, RN CGRN PROCEDURE DATE: 12/05/2013 PROCEDURE:   Colonoscopy, diagnostic ASA CLASS:   Class III INDICATIONS:Iron deficiency anemia. MEDICATIONS: Versed 8 mg IV and Fentanyl 100 mcg IV  DESCRIPTION OF PROCEDURE:   After the risks benefits and alternatives of the procedure were thoroughly explained, informed consent was obtained.  A digital rectal exam revealed no abnormalities of the rectum.    The     endoscope was introduced through the anus  and advanced to the cecum, which was identified by both the appendix and ileocecal valve , No adverse events experienced.    The quality of the prep was good. .  The instrument was then slowly withdrawn as the colon was fully examined.     FINDINGS: A 7-8 mm sessile ascending colon polyp was identified, but not removed.  Again, this procedure was strictly diagnostic as she cannot stop Brintila.  Scattered left sided diverticula were identified.  No evidence of any inflammation, ulcerations, erosions, or vascular abnormalities.   Retroflexed views revealed no abnormalities.     The scope was then withdrawn from the patient and the procedure terminated.  COMPLICATIONS: There were no complications.  IMPRESSION: 1) Ascending colon polyp. 2) Left sided diverticula.  RECOMMENDATIONS: 1) Repeat colonoscopy in 3-6 months off of Brintila for polypectomy. 2) Capsule endoscopy.  _______________________________ eSignedCarol Ada, MD 12/05/2013 9:48 AM

## 2013-12-05 NOTE — Op Note (Signed)
[facility] [place], [address]   OPERATIVE PROCEDURE REPORT  PATIENT: Phyllis Conner, Phyllis Conner  MR#: [id number] BIRTHDATE: February 29, 1946  GENDER: Female ENDOSCOPIST: Carol Ada, MD ASSISTANT:   Jiles Harold, technician and Luanne Bras, RN CGRN PROCEDURE DATE: 12/05/2013 PROCEDURE:   EGD, diagnostic ASA CLASS:   Class III INDICATIONS:Iron deficiency anemia. MEDICATIONS: See the Colonoscopy report. TOPICAL ANESTHETIC:   none  DESCRIPTION OF PROCEDURE:   After the risks benefits and alternatives of the procedure were thoroughly explained, informed consent was obtained.  The Pentax Gastroscope [id number]  endoscope was introduced through the mouth  and advanced to the second portion of the duodenum Without limitations.      The instrument was slowly withdrawn as the mucosa was fully examined.      FINDINGS: The esophagus was normal.  In the gastric fundus a very mild gastritis was identified, but there was no evidence of any bleeding.  This is not the source of anemia.  No other abnormalities identified in the gastric lumen.  The examined duodenum was normal..          The scope was then withdrawn from the patient and the procedure terminated.  COMPLICATIONS: There were no complications.  IMPRESSION: 1) Minimal fundic gastritis.  RECOMMENDATIONS: 1) Proceed with the colonoscopy.  _______________________________ eSignedCarol Ada, MD 12/05/2013 9:50 AM

## 2013-12-08 ENCOUNTER — Encounter (HOSPITAL_COMMUNITY): Payer: Self-pay | Admitting: Gastroenterology

## 2014-01-21 ENCOUNTER — Other Ambulatory Visit: Payer: Self-pay | Admitting: Cardiology

## 2014-01-22 NOTE — Telephone Encounter (Signed)
Rx was sent to pharmacy electronically. 

## 2014-02-07 ENCOUNTER — Other Ambulatory Visit: Payer: Self-pay | Admitting: Cardiology

## 2014-02-09 NOTE — Telephone Encounter (Signed)
Rx was sent to pharmacy electronically. 

## 2014-02-20 ENCOUNTER — Other Ambulatory Visit: Payer: Self-pay | Admitting: Cardiology

## 2014-02-20 NOTE — Telephone Encounter (Signed)
Rx was sent to pharmacy electronically. 

## 2014-03-19 ENCOUNTER — Encounter (HOSPITAL_COMMUNITY): Payer: Self-pay | Admitting: Cardiovascular Disease

## 2014-04-09 ENCOUNTER — Telehealth (HOSPITAL_COMMUNITY): Payer: Self-pay | Admitting: *Deleted

## 2014-04-16 ENCOUNTER — Encounter: Payer: Self-pay | Admitting: Cardiology

## 2014-04-16 ENCOUNTER — Ambulatory Visit (INDEPENDENT_AMBULATORY_CARE_PROVIDER_SITE_OTHER): Payer: 59 | Admitting: Cardiology

## 2014-04-16 VITALS — BP 130/70 | HR 61 | Ht 64.0 in | Wt 180.1 lb

## 2014-04-16 DIAGNOSIS — R0989 Other specified symptoms and signs involving the circulatory and respiratory systems: Secondary | ICD-10-CM

## 2014-04-16 DIAGNOSIS — E785 Hyperlipidemia, unspecified: Secondary | ICD-10-CM

## 2014-04-16 DIAGNOSIS — I214 Non-ST elevation (NSTEMI) myocardial infarction: Secondary | ICD-10-CM | POA: Diagnosis not present

## 2014-04-16 DIAGNOSIS — E669 Obesity, unspecified: Secondary | ICD-10-CM

## 2014-04-16 DIAGNOSIS — I2510 Atherosclerotic heart disease of native coronary artery without angina pectoris: Secondary | ICD-10-CM

## 2014-04-16 DIAGNOSIS — Z9861 Coronary angioplasty status: Secondary | ICD-10-CM | POA: Diagnosis not present

## 2014-04-16 DIAGNOSIS — I10 Essential (primary) hypertension: Secondary | ICD-10-CM

## 2014-04-16 NOTE — Patient Instructions (Addendum)
NO CHANGES IN MEDICATIONS- STOP ASPIRIN   Your physician wants you to follow-up in [place].  You will receive a reminder letter in the mail two months in advance. If you don't receive a letter, please call our office to schedule the follow-up appointment.

## 2014-04-18 ENCOUNTER — Encounter: Payer: Self-pay | Admitting: Cardiology

## 2014-04-18 NOTE — Progress Notes (Signed)
PCP: Elyn Peers, MD  Clinic Note: Chief Complaint  Patient presents with  . 6 month visit    chest discomfort at times at rest - lasting few seconds, no swelling ,no sob  . Coronary Artery Disease    Status post PCI for nonSTEMI    HPI: Phyllis Conner is a 69 y.o. female with a history of CAD status post PCI with stenting of the non-STEMI in November 2014.  She presents today for 6 month follow-up.  Last seen in July & was doing relatively well.  She had been seen several times throughout May as noted in my note from July. Her latest labs suggested that her renal function has recovered. Lab Results  Component Value Date   CREATININE 1.27* 08/25/2013   Interval History:  Since I last saw her, she has been doing relatively well.  She denies any exertional chest tightness or pressure. He does however note very short fleeting twinges of discomfort in the epigastric and lower substernal area usually after eating. These episodes were not associated with dyspnea, and they never occur with exertion. Usually it depends on what she is eating which makes her think that is probably related to indigestion.  She does not get routine exercise, but indicates that she is very active around the house doing vacuuming and yard work etc. All of these activities are without any significant anginal symptoms or dyspnea. She still has intermittent mild palpitations and mild edema but otherwise no rapid irregular heartbeats, TAH/amaurosis fugax or syncope/near-syncope. She denies any lightheadedness or dizziness. No melena, hematochezia, hematuria, or epistaxis. She's not had any further orthostatic symptoms.  No bleeding concerns. She is due to have or annual clinical exam with her PCP next week.  Past Medical History  Diagnosis Date  . Diabetes mellitus type 2 in obese   . Essential hypertension   . Hyperlipidemia with target LDL less than 70   . Non-Q wave ST elevation myocardial infarction (STEMI)  involving left anterior descending (LAD) coronary artery November 2014    PCI to LAD and circumflex  . CAD S/P percutaneous coronary angioplasty November 2014    Mid LAD 2.5 mm x 12 mm Promus DES (2.70 mm); proximal OM1 - Promus DES 2.75 mm x 16 mm (2.85 mm)  . Obesity (BMI 30-39.9) 05/05/2013    Prior Cardiac Evaluation and Past Surgical History: Past Surgical History  Procedure Laterality Date  . Cardiac catheterization  November 2014    Mid LAD 95%, proximal OM1 80-90% --> two-vessel PCI  . Percutaneous coronary stent intervention (pci-s)  November 2014    Mid LAD 2.5 mm x 12 mm (2.75 mm) Promus DES; OM1 2.75 mm x 60 mm (2.85 mm) Promus DES  . Transthoracic echocardiogram  02/16/2013    EF 40-50%. Moderate concentric LVH. Moderate H. Shea of mid-distal septal and apical region. Grade 1 diastolic dysfunction. Moderate aortic sclerosis without stenosis.   Carotid Dopplers          July 2015  Normal Doppler study. No sign of significant stenoses  Allergies  Allergen Reactions  . Iohexol Hives and Other (See Comments)    Excessive sweating   Current Outpatient Prescriptions on File Prior to Visit  Medication Sig Dispense Refill  . amLODipine (NORVASC) 5 MG tablet Take 1 tablet (5 mg total) by mouth daily. 180 tablet 3  . atorvastatin (LIPITOR) 80 MG tablet TAKE 1 TABLET BY MOUTH DAILY AT 6 PM 90 tablet 2  . BRILINTA 90 MG  TABS tablet TAKE 1 TABLET BY MOUTH TWICE DAILY 60 tablet 9  . chlorthalidone (HYGROTON) 25 MG tablet Take 25 mg by mouth daily.    . insulin aspart (NOVOLOG) 100 UNIT/ML injection 24 hour continuous dose  Abd. dosing    . INVESTIGATIONAL DRUG SIMPLE RECORD [employer] :[phone]  STUDY DRUG  TAKE 1 TABLET TWICE A DAY    . isosorbide mononitrate (IMDUR) 30 MG 24 hr tablet TAKE 1 TABLET BY MOUTH DAILY 90 tablet 2  . losartan (COZAAR) 100 MG tablet Take 100 mg by mouth daily.    . metFORMIN (GLUCOPHAGE) 500 MG tablet Take 1 tablet (500 mg total) by mouth daily.    .  metoprolol tartrate (LOPRESSOR) 25 MG tablet TAKE ONE-HALF TABLET BY MOUTH TWICE DAILY 90 tablet 2  . nitroGLYCERIN (NITROSTAT) 0.4 MG SL tablet Place 1 tablet (0.4 mg total) under the tongue every 5 (five) minutes x 3 doses as needed for chest pain. 25 tablet 2  . sitaGLIPtin (JANUVIA) 100 MG tablet Take 100 mg by mouth daily.     No current facility-administered medications on file prior to visit.   No Change in Social and Family History - Reviewed in Epic  ROS: A comprehensive Review of Systems - was performed Review of Systems  Constitutional: Positive for weight loss (Intentional with dietary adjustments and exercise). Negative for malaise/fatigue.  HENT: Negative for nosebleeds.   Respiratory: Negative for cough, shortness of breath and wheezing.   Gastrointestinal: Positive for heartburn. Negative for nausea, vomiting, abdominal pain, diarrhea, blood in stool and melena.  Genitourinary: Negative for hematuria.  Musculoskeletal: Negative for myalgias and joint pain.  Neurological: Negative for dizziness (See above).  Endo/Heme/Allergies: Does not bruise/bleed easily.  Psychiatric/Behavioral: Negative for depression and memory loss. The patient is not nervous/anxious and does not have insomnia.   All other systems reviewed and are negative.   Wt Readings from Last 3 Encounters:  04/16/14 180 lb 1.6 oz (81.693 kg)  12/05/13 185 lb (83.915 kg)  10/15/13 195 lb 11.2 oz (88.769 kg)    PHYSICAL EXAM BP 130/70 mmHg  Pulse 61  Ht 5' 4"  (1.626 m)  Wt 180 lb 1.6 oz (81.693 kg)  BMI 30.90 kg/m2 General appearance: alert, cooperative, appears stated age, no distress, mildly obese and well-nourished and well-groomed. Normal moood and affect.  Neck: no adenopathy, unable to hear carotid bruit, no JVD, supple, symmetrical, trachea midline and thyroid not enlarged, symmetric, no tenderness/mass/nodules  Lungs: CTAB, normal percussion bilaterally and Nonlabored, good air movement  Heart:  RRR, N0&N 2 normal, no click, rub or gallop and normal apical impulse; 2/6 SEM @ RUSB (c-d)   (Inadvertently not included during last physical examination in July 2015) Abdomen: soft, non-tender; bowel sounds normal; no masses, no HSM; Mild clinical obesity  Extremities: extremities normal, atraumatic, no cyanosis or edema and Mild bruising  Pulses: 2+ and symmetric  Neurologic: A&O X 3, normal strength and tone. Normal symmetric reflexes. Normal coordination and gait   Adult ECG Report : NSR - 61; normal axis, intervals, duration & voltage. - Normal EKG    Recent Labs:  None since May  ASSESSMENT / PLAN: NSTEMI (non-ST elevated myocardial infarction) No recurrent anginal symptoms since her MI. Underwent two-vessel PCI and is on stable regimen. No heart failure or anginal symptoms, therefore I don't know if it is necessary to recheck echocardiogram as it would not change management.  CAD S/P percutaneous coronary angioplasty -- LAD/OM1 DES 02/17/13 Currently on dual antiplatelet therapies.  We can stop aspirin. Continue with beta blocker as well as ARB. I cannot titrate the beta blocker up anymore with her heart rate being 60. She is also on Imdur. Currently on atorvastatin at high dose. He is due for followup labs per PCP next week - 11 suspected he may deal to reduce her dose from 80 mg to 40 mg differently to control his looking better.   Hyperlipidemia with target LDL less than 70 On statin. As above indicated should be having labs checked soon next week. Consider possibly reducing dose atorvastatin - as indicates that she is taking 80 mg still.  Essential hypertension Stable blood pressure on current regimen. Is now on stable dose of amlodipine as well as chlorthalidone. Losartan is at max dose of metoprolol is at max tolerable dose. With prior orthostatic symptoms I will be reluctant to titrate medications again.  Obesity (BMI 30-39.9) She continues to do well with weight loss.  Down 22 pounds from May.  Continued to be active in monitoring her diet. I congratulated her on her efforts.  Right carotid bruit  Carotid Doppler did not show any evidence of obstructive disease.  I actually did not hear the bruit today. This would suggest that perhaps I was hearing her breathing as opposed to a bruit    Orders Placed This Encounter  Procedures  . EKG 12-Lead   Meds ordered this encounter  Medications  . Insulin Disposable Pump (V-GO 20) KIT    Sig:     Refill:  3  . ACCU-CHEK AVIVA PLUS test strip    Sig:     Refill:  2    Followup: 6 months  Azarie Coriz W. Ellyn Hack, M.D., M.S. Interventional Cardiologist [facility]   At the time of this note - Carotid Dopplers have been performed.  No significant stenosis.

## 2014-04-18 NOTE — Assessment & Plan Note (Signed)
No recurrent anginal symptoms since her MI. Underwent two-vessel PCI and is on stable regimen. No heart failure or anginal symptoms, therefore I don't know if it is necessary to recheck echocardiogram as it would not change management.

## 2014-04-18 NOTE — Assessment & Plan Note (Signed)
On statin. As above indicated should be having labs checked soon next week. Consider possibly reducing dose atorvastatin - as indicates that she is taking 80 mg still.

## 2014-04-18 NOTE — Assessment & Plan Note (Signed)
She continues to do well with weight loss. Down 22 pounds from May.  Continued to be active in monitoring her diet. I congratulated her on her efforts.

## 2014-04-18 NOTE — Assessment & Plan Note (Addendum)
Carotid Doppler did not show any evidence of obstructive disease.  I actually did not hear the bruit today. This would suggest that perhaps I was hearing her breathing as opposed to a bruit

## 2014-04-18 NOTE — Assessment & Plan Note (Signed)
Currently on dual antiplatelet therapies. We can stop aspirin. Continue with beta blocker as well as ARB. I cannot titrate the beta blocker up anymore with her heart rate being 60. She is also on Imdur. Currently on atorvastatin at high dose. He is due for followup labs per PCP next week - 11 suspected he may deal to reduce her dose from 80 mg to 40 mg differently to control his looking better.

## 2014-04-18 NOTE — Assessment & Plan Note (Signed)
Stable blood pressure on current regimen. Is now on stable dose of amlodipine as well as chlorthalidone. Losartan is at max dose of metoprolol is at max tolerable dose. With prior orthostatic symptoms I will be reluctant to titrate medications again.

## 2014-04-21 DIAGNOSIS — I119 Hypertensive heart disease without heart failure: Secondary | ICD-10-CM | POA: Diagnosis not present

## 2014-04-21 DIAGNOSIS — F4320 Adjustment disorder, unspecified: Secondary | ICD-10-CM | POA: Diagnosis not present

## 2014-04-21 DIAGNOSIS — E1300 Other specified diabetes mellitus with hyperosmolarity without nonketotic hyperglycemic-hyperosmolar coma (NKHHC): Secondary | ICD-10-CM | POA: Diagnosis not present

## 2014-04-22 ENCOUNTER — Encounter: Payer: Self-pay | Admitting: Cardiology

## 2014-06-30 DIAGNOSIS — E1151 Type 2 diabetes mellitus with diabetic peripheral angiopathy without gangrene: Secondary | ICD-10-CM | POA: Diagnosis not present

## 2014-06-30 DIAGNOSIS — E1165 Type 2 diabetes mellitus with hyperglycemia: Secondary | ICD-10-CM | POA: Diagnosis not present

## 2014-08-19 DIAGNOSIS — F4321 Adjustment disorder with depressed mood: Secondary | ICD-10-CM | POA: Diagnosis not present

## 2014-08-19 DIAGNOSIS — I119 Hypertensive heart disease without heart failure: Secondary | ICD-10-CM | POA: Diagnosis not present

## 2014-08-19 DIAGNOSIS — G4700 Insomnia, unspecified: Secondary | ICD-10-CM | POA: Diagnosis not present

## 2014-08-19 DIAGNOSIS — E1300 Other specified diabetes mellitus with hyperosmolarity without nonketotic hyperglycemic-hyperosmolar coma (NKHHC): Secondary | ICD-10-CM | POA: Diagnosis not present

## 2014-08-26 DIAGNOSIS — M7541 Impingement syndrome of right shoulder: Secondary | ICD-10-CM | POA: Diagnosis not present

## 2014-08-26 DIAGNOSIS — M7542 Impingement syndrome of left shoulder: Secondary | ICD-10-CM | POA: Diagnosis not present

## 2014-09-09 DIAGNOSIS — M7542 Impingement syndrome of left shoulder: Secondary | ICD-10-CM | POA: Diagnosis not present

## 2014-10-07 DIAGNOSIS — M7542 Impingement syndrome of left shoulder: Secondary | ICD-10-CM | POA: Diagnosis not present

## 2014-10-15 ENCOUNTER — Other Ambulatory Visit: Payer: Self-pay | Admitting: Cardiology

## 2014-10-15 NOTE — Telephone Encounter (Signed)
E-sent 3 refills to pharmacy

## 2014-10-21 ENCOUNTER — Encounter: Payer: Self-pay | Admitting: Cardiology

## 2014-10-21 ENCOUNTER — Ambulatory Visit (INDEPENDENT_AMBULATORY_CARE_PROVIDER_SITE_OTHER): Payer: Medicare Other | Admitting: Cardiology

## 2014-10-21 VITALS — BP 118/72 | HR 62 | Ht 63.0 in | Wt 180.3 lb

## 2014-10-21 DIAGNOSIS — I214 Non-ST elevation (NSTEMI) myocardial infarction: Secondary | ICD-10-CM

## 2014-10-21 DIAGNOSIS — I10 Essential (primary) hypertension: Secondary | ICD-10-CM | POA: Diagnosis not present

## 2014-10-21 DIAGNOSIS — E785 Hyperlipidemia, unspecified: Secondary | ICD-10-CM

## 2014-10-21 DIAGNOSIS — I951 Orthostatic hypotension: Secondary | ICD-10-CM

## 2014-10-21 DIAGNOSIS — I2510 Atherosclerotic heart disease of native coronary artery without angina pectoris: Secondary | ICD-10-CM

## 2014-10-21 DIAGNOSIS — E669 Obesity, unspecified: Secondary | ICD-10-CM

## 2014-10-21 DIAGNOSIS — R0989 Other specified symptoms and signs involving the circulatory and respiratory systems: Secondary | ICD-10-CM

## 2014-10-21 DIAGNOSIS — Z9861 Coronary angioplasty status: Secondary | ICD-10-CM

## 2014-10-21 NOTE — Patient Instructions (Signed)
NO CHANGE WITH CURRENT MEDICATIONS  PLEASE HAVE PRIMARY SEND COPY OF CHOLESTEROL PANEL.  Your physician recommends that you schedule a follow-up appointment in [place] wants you to follow-up in [place]. You will receive a reminder letter in the mail two months in advance. If you don't receive a letter, please call our office to schedule the follow-up appointment.

## 2014-10-28 ENCOUNTER — Encounter: Payer: Self-pay | Admitting: Cardiology

## 2014-10-28 NOTE — Assessment & Plan Note (Signed)
Negative carotid Dopplers for stenoses. This is probably a radiated murmur.

## 2014-10-28 NOTE — Assessment & Plan Note (Signed)
Not currently an issue. For this reason however, I would not be too aggressive with blood pressure control.

## 2014-10-28 NOTE — Assessment & Plan Note (Signed)
On high-dose statin. Labs being monitored by PCP. Consider possibility of reducing Lipitor to 40 mg if levels have improved.

## 2014-10-28 NOTE — Assessment & Plan Note (Signed)
Remains on Brilinta alone without aspirin. Also on high-dose statin with lipids being monitored by PCP. She is on a beta blocker and nitrates along with calcium channel blocker. Unable to titrate beta blockers due to bradycardia.

## 2014-10-28 NOTE — Progress Notes (Signed)
PCP: Elyn Peers, MD  Clinic Note: Chief Complaint  Patient presents with  . Follow-up     chest pain-occassional discomfort, shortness of breath-occassional, no edema,has pain in legs, has cramping in legs, has lightheadedness,has dizziness  . Coronary Artery Disease    HPI: Phyllis Conner is a 69 y.o. female with a history of CAD status post PCI with stenting of the non-STEMI in November 2014.  She presents today for 6 month follow-up.  Last seen in June 2016.     Interval History:  Since I last saw her, she has been doing relatively well.  She denies any exertional chest tightness or pressure. She still complaints of occasional short fleeting twinges of discomfort in the epigastric and lower substernal area usually after eating. These episodes were not associated with dyspnea, and they never occur with exertion. Usually it depends on what she is eating which makes her think that is probably related to indigestion.  She does not get routine exercise, but indicates that she is very active around the house doing vacuuming and yard work etc. All of these activities are without any significant anginal symptoms or dyspnea. She still has intermittent mild palpitations and mild edema but otherwise no rapid irregular heartbeats, TAH/amaurosis fugax or syncope/near-syncope. She denies any lightheadedness or dizziness. No melena, hematochezia, hematuria, or epistaxis. She's not had any further orthostatic symptoms.  No bleeding concerns.  She states that as she is quite upset this seems not to lose the weight that she gained back. She is really want to make certain that to get back into her walking exercise program.  Past Medical History  Diagnosis Date  . Diabetes mellitus type 2 in obese   . Essential hypertension   . Hyperlipidemia with target LDL less than 70   . Non-Q wave ST elevation myocardial infarction (STEMI) involving left anterior descending (LAD) coronary artery November 2014   PCI to LAD and circumflex  . CAD S/P percutaneous coronary angioplasty November 2014    Mid LAD 2.5 mm x 12 mm Promus DES (2.70 mm); proximal OM1 - Promus DES 2.75 mm x 16 mm (2.85 mm)  . Obesity (BMI 30-39.9) 05/05/2013    Prior Cardiac Evaluation and Past Surgical History: Past Surgical History  Procedure Laterality Date  . Cardiac catheterization  November 2014    Mid LAD 95%, proximal OM1 80-90% --> two-vessel PCI  . Percutaneous coronary stent intervention (pci-s)  November 2014    Mid LAD 2.5 mm x 12 mm (2.75 mm) Promus DES; OM1 2.75 mm x 60 mm (2.85 mm) Promus DES  . Transthoracic echocardiogram  02/16/2013    EF 40-50%. Moderate concentric LVH. Moderate H. Shea of mid-distal septal and apical region. Grade 1 diastolic dysfunction. Moderate aortic sclerosis without stenosis.   Carotid Dopplers          July 2015  Normal Doppler study. No sign of significant stenoses  Allergies  Allergen Reactions  . Iohexol Hives and Other (See Comments)    Excessive sweating   Current Outpatient Prescriptions on File Prior to Visit  Medication Sig Dispense Refill  . ACCU-CHEK AVIVA PLUS test strip   2  . amLODipine (NORVASC) 5 MG tablet TAKE 1 TABLET BY MOUTH DAILY 90 tablet 3  . atorvastatin (LIPITOR) 80 MG tablet TAKE 1 TABLET BY MOUTH DAILY AT 6 PM 90 tablet 2  . BRILINTA 90 MG TABS tablet TAKE 1 TABLET BY MOUTH TWICE DAILY 60 tablet 9  . chlorthalidone (HYGROTON) 25  MG tablet Take 25 mg by mouth daily.    . Insulin Disposable Pump (V-GO 20) KIT   3  . INVESTIGATIONAL DRUG SIMPLE RECORD [employer] :[phone]  STUDY DRUG  TAKE 1 TABLET TWICE A DAY    . isosorbide mononitrate (IMDUR) 30 MG 24 hr tablet TAKE 1 TABLET BY MOUTH EVERY DAY 90 tablet 3  . metFORMIN (GLUCOPHAGE) 500 MG tablet Take 1 tablet (500 mg total) by mouth daily.    . metoprolol tartrate (LOPRESSOR) 25 MG tablet TAKE ONE-HALF TABLET BY MOUTH TWICE DAILY 90 tablet 3  . nitroGLYCERIN (NITROSTAT) 0.4 MG SL tablet Place 1  tablet (0.4 mg total) under the tongue every 5 (five) minutes x 3 doses as needed for chest pain. 25 tablet 2  . sitaGLIPtin (JANUVIA) 100 MG tablet Take 100 mg by mouth daily.     No current facility-administered medications on file prior to visit.   History  Substance Use Topics  . Smoking status: Never Smoker   . Smokeless tobacco: Not on file  . Alcohol Use: No   History reviewed. No pertinent family history.   ROS: A comprehensive Review of Systems - was performed Review of Systems  Constitutional: Positive for weight loss (Unfortunately, she is put back on weight after having lost close to 10 pounds.. Still frustrated). Negative for malaise/fatigue.  HENT: Negative for nosebleeds.   Respiratory: Negative for cough, shortness of breath and wheezing.   Cardiovascular:       Per history of present illness  Gastrointestinal: Positive for heartburn. Negative for nausea, vomiting, abdominal pain, diarrhea, blood in stool and melena.  Genitourinary: Negative for hematuria.  Musculoskeletal: Positive for joint pain (Bilateral knee pain thinking hard for her to walk.). Negative for myalgias.  Neurological: Negative for dizziness (See above).  Endo/Heme/Allergies: Does not bruise/bleed easily.  Psychiatric/Behavioral: Negative for depression and memory loss. The patient is not nervous/anxious and does not have insomnia.   All other systems reviewed and are negative.   Wt Readings from Last 3 Encounters:  10/21/14 81.784 kg (180 lb 4.8 oz)  04/16/14 81.693 kg (180 lb 1.6 oz)  12/05/13 83.915 kg (185 lb)    PHYSICAL EXAM BP 118/72 mmHg  Pulse 62  Ht 5' 3"  (1.6 m)  Wt 81.784 kg (180 lb 4.8 oz)  BMI 31.95 kg/m2 General appearance: alert, cooperative, appears stated age, no distress, mildly obese and well-nourished and well-groomed. Normal moood and affect.  Neck: no adenopathy, soft right carotid bruit (quite likely radiated murmur), no JVD, supple, symmetrical, trachea midline and  thyroid not enlarged, symmetric, no tenderness/mass/nodules  Lungs: CTAB, normal percussion bilaterally and Nonlabored, good air movement  Heart: RRR, Z3&Y 2 normal, no click, rub or gallop and normal apical impulse; 2/6 SEM @ RUSB (c-d)  Abdomen: soft, non-tender; bowel sounds normal; no masses, no HSM; Mild clinical obesity  Extremities: extremities normal, atraumatic, no cyanosis or edema and Mild bruising  Pulses: 2+ and symmetric  Neurologic: A&O X 3, normal strength and tone. Normal symmetric reflexes. Normal coordination and gait   Adult ECG Report : NSR - 62; normal axis, intervals, duration & voltage. - Normal EKG    Recent Labs:  None since May  ASSESSMENT / PLAN: CAD S/P percutaneous coronary angioplasty -- LAD/OM1 DES 02/17/13 Remains on Brilinta alone without aspirin. Also on high-dose statin with lipids being monitored by PCP. She is on a beta blocker and nitrates along with calcium channel blocker. Unable to titrate beta blockers due to bradycardia.  Essential  hypertension Stable on current medications. No change  Orthostatic hypotension Not currently an issue. For this reason however, I would not be too aggressive with blood pressure control.  Hyperlipidemia with target LDL less than 70 On high-dose statin. Labs being monitored by PCP. Consider possibility of reducing Lipitor to 40 mg if levels have improved.  Right carotid bruit Negative carotid Dopplers for stenoses. This is probably a radiated murmur.  Obesity (BMI 30-39.9) Proctoscopy this for her weight loss goes. She is planning on making a concerted effort to get back into her exercise regimen.    Orders Placed This Encounter  Procedures  . EKG 12-Lead    NO CHANGE WITH CURRENT MEDICATIONS  PLEASE HAVE PRIMARY SEND COPY OF CHOLESTEROL PANEL.  Your physician recommends that you schedule a follow-up appointment in [place] wants you to follow-up in [place].   Ishmel Acevedo W. Ellyn Hack, M.D., M.S. Interventional Cardiologist [facility]

## 2014-10-28 NOTE — Assessment & Plan Note (Signed)
Stable on current medications. No change

## 2014-10-28 NOTE — Assessment & Plan Note (Signed)
Proctoscopy this for her weight loss goes. She is planning on making a concerted effort to get back into her exercise regimen.

## 2014-11-23 ENCOUNTER — Other Ambulatory Visit: Payer: Self-pay | Admitting: Gastroenterology

## 2014-11-23 ENCOUNTER — Telehealth: Payer: Self-pay | Admitting: Cardiology

## 2014-11-23 DIAGNOSIS — I2510 Atherosclerotic heart disease of native coronary artery without angina pectoris: Secondary | ICD-10-CM | POA: Diagnosis not present

## 2014-11-23 DIAGNOSIS — D500 Iron deficiency anemia secondary to blood loss (chronic): Secondary | ICD-10-CM | POA: Diagnosis not present

## 2014-11-23 DIAGNOSIS — Z86010 Personal history of colonic polyps: Secondary | ICD-10-CM | POA: Diagnosis not present

## 2014-11-23 NOTE — Telephone Encounter (Signed)
Phyllis Conner' PCI was in 2014.  She can stop her Brilinta and restart after colonoscopy as directed by her gastroenterologist.

## 2014-11-23 NOTE — Telephone Encounter (Signed)
ROUTED Phyllis Conner

## 2014-11-23 NOTE — Telephone Encounter (Signed)
Phyllis Conner is calling to get cardiac clearance for Mrs. Phyllis Conner to stop her Kary Kos , patient is schedule to have a Colonscopy on 12/10/04. Please call   Thanks

## 2014-12-01 ENCOUNTER — Encounter (HOSPITAL_COMMUNITY): Payer: Self-pay | Admitting: *Deleted

## 2014-12-10 NOTE — H&P (Signed)
  Phyllis Conner HPI: On 12/05/2013 the patient underwent a diagnostic colonoscopy for evaluation of an anemia. A 7-8 mm ascending colon polyp was identified and it was not removed as she was on Brilinta. At that time she was not able to get off of the medication. She was recommended to have a follow up colonoscopy off of Brilinta in 3-6 months. A capsule endoscopy was also ordered, but she did not follow up with the procedure. Her HGB last year was at 10.8 g/dL.  Past Medical History  Diagnosis Date  . Diabetes mellitus type 2 in obese   . Essential hypertension   . Hyperlipidemia with target LDL less than 70   . Non-Q wave ST elevation myocardial infarction (STEMI) involving left anterior descending (LAD) coronary artery November 2014    PCI to LAD and circumflex  . CAD S/P percutaneous coronary angioplasty November 2014    Mid LAD 2.5 mm x 12 mm Promus DES (2.70 mm); proximal OM1 - Promus DES 2.75 mm x 16 mm (2.85 mm)  . Obesity (BMI 30-39.9) 05/05/2013    Past Surgical History  Procedure Laterality Date  . Abdominal hysterectomy  1990s  . Arthroscopic knee surgery Bilateral 1984, 2002    Arthroscopic  . Cardiac catheterization  November 2014    Mid LAD 95%, proximal OM1 80-90% --> two-vessel PCI  . Percutaneous coronary stent intervention (pci-s)  November 2014    Mid LAD 2.5 mm x 12 mm (2.75 mm) Promus DES; OM1 2.75 mm x 60 mm (2.85 mm) Promus DES  . Transthoracic echocardiogram  02/16/2013    EF 40-50%. Moderate concentric LVH. Moderate H. Shea of mid-distal septal and apical region. Grade 1 diastolic dysfunction. Moderate aortic sclerosis without stenosis.  . Esophagogastroduodenoscopy N/A 12/05/2013    Procedure: ESOPHAGOGASTRODUODENOSCOPY (EGD);  Surgeon: Beryle Beams, MD;  Location: [facility];  Service: Endoscopy;  Laterality: N/A;  . Colonoscopy N/A 12/05/2013    Procedure: COLONOSCOPY;  Surgeon: Beryle Beams, MD;  Location: [facility];  Service: Endoscopy;  Laterality:  N/A;  . Left heart catheterization with coronary angiogram N/A 02/17/2013    Procedure: LEFT HEART CATHETERIZATION WITH CORONARY ANGIOGRAM;  Surgeon: Sanda Klein, MD;  Location: [facility];  Service: Cardiovascular;  Laterality: N/A;    History reviewed. No pertinent family history.  Social History:  reports that she has never smoked. She has never used smokeless tobacco. She reports that she does not drink alcohol or use illicit drugs.  Allergies:  Allergies  Allergen Reactions  . Iohexol Hives and Other (See Comments)    Excessive sweating    Medications: Scheduled: Continuous:  No results found for this or any previous visit (from the past 24 hour(s)).   No results found.  ROS:  As stated above in the HPI otherwise negative.  There were no vitals taken for this visit.    PE: Gen: NAD, Alert and Oriented HEENT:  [place]/AT, EOMI Neck: Supple, no LAD Lungs: CTA Bilaterally CV: RRR without M/G/R ABM: Soft, NTND, +BS Ext: No C/C/E  Assessment/Plan: 1) Colonoscopy for colonic polyp in the ascending colon.  HGB is stable/mildly improved.  No need for the capsule endoscopy.  Irineo Gaulin D 12/10/2014, 7:38 AM

## 2014-12-10 NOTE — Progress Notes (Addendum)
12-10-14 1030 AM-Spoke with Janan Halter coordinator [phone] pager, made aware of pt scheduled procedure 12-11-14 1130 Endoscopy/Dr. Benson Norway.Insulin pump policy with chart, Contract for Insulin pump with chart. Insulin Pump flow sheet with chart.

## 2014-12-11 ENCOUNTER — Encounter (HOSPITAL_COMMUNITY)
Admission: RE | Disposition: A | Discharge status: Home / Self Care | Payer: Self-pay | Source: Ambulatory Visit | Attending: Gastroenterology

## 2014-12-11 ENCOUNTER — Ambulatory Visit (HOSPITAL_COMMUNITY)
Admission: RE | Admit: 2014-12-11 | Discharge: 2014-12-11 | Disposition: A | Discharge status: Home / Self Care | Payer: Medicare Other | Source: Ambulatory Visit | Attending: Gastroenterology | Admitting: Gastroenterology

## 2014-12-11 ENCOUNTER — Ambulatory Visit (HOSPITAL_COMMUNITY): Payer: Medicare Other | Admitting: Anesthesiology

## 2014-12-11 ENCOUNTER — Encounter (HOSPITAL_COMMUNITY): Payer: Self-pay | Admitting: *Deleted

## 2014-12-11 DIAGNOSIS — D122 Benign neoplasm of ascending colon: Secondary | ICD-10-CM | POA: Insufficient documentation

## 2014-12-11 DIAGNOSIS — I10 Essential (primary) hypertension: Secondary | ICD-10-CM | POA: Insufficient documentation

## 2014-12-11 DIAGNOSIS — Z955 Presence of coronary angioplasty implant and graft: Secondary | ICD-10-CM | POA: Insufficient documentation

## 2014-12-11 DIAGNOSIS — Z79899 Other long term (current) drug therapy: Secondary | ICD-10-CM | POA: Insufficient documentation

## 2014-12-11 DIAGNOSIS — Z1211 Encounter for screening for malignant neoplasm of colon: Secondary | ICD-10-CM | POA: Diagnosis not present

## 2014-12-11 DIAGNOSIS — K5730 Diverticulosis of large intestine without perforation or abscess without bleeding: Secondary | ICD-10-CM | POA: Diagnosis not present

## 2014-12-11 DIAGNOSIS — I2510 Atherosclerotic heart disease of native coronary artery without angina pectoris: Secondary | ICD-10-CM | POA: Insufficient documentation

## 2014-12-11 DIAGNOSIS — E785 Hyperlipidemia, unspecified: Secondary | ICD-10-CM | POA: Insufficient documentation

## 2014-12-11 DIAGNOSIS — I252 Old myocardial infarction: Secondary | ICD-10-CM | POA: Insufficient documentation

## 2014-12-11 DIAGNOSIS — E119 Type 2 diabetes mellitus without complications: Secondary | ICD-10-CM | POA: Insufficient documentation

## 2014-12-11 DIAGNOSIS — K635 Polyp of colon: Secondary | ICD-10-CM | POA: Diagnosis not present

## 2014-12-11 DIAGNOSIS — K5790 Diverticulosis of intestine, part unspecified, without perforation or abscess without bleeding: Secondary | ICD-10-CM | POA: Diagnosis not present

## 2014-12-11 HISTORY — PX: COLONOSCOPY WITH PROPOFOL: SHX5780

## 2014-12-11 LAB — GLUCOSE, CAPILLARY
Glucose-Capillary: 58 mg/dL — ABNORMAL LOW (ref 65–99)
Glucose-Capillary: 76 mg/dL (ref 65–99)
Glucose-Capillary: 75 mg/dL (ref 65–99)
Glucose-Capillary: 87 mg/dL (ref 65–99)

## 2014-12-11 SURGERY — COLONOSCOPY WITH PROPOFOL
Anesthesia: Monitor Anesthesia Care

## 2014-12-11 MED ORDER — DEXTROSE 50 % IV SOLN
25.0000 mL | Freq: Once | INTRAVENOUS | Status: AC
  Administered 2014-12-11: 25 mL via INTRAVENOUS

## 2014-12-11 MED ORDER — LACTATED RINGERS IV SOLN
INTRAVENOUS | Status: DC
  Administered 2014-12-11: 1000 mL via INTRAVENOUS

## 2014-12-11 MED ORDER — SODIUM CHLORIDE 0.9 % IV SOLN: INTRAVENOUS | Status: DC

## 2014-12-11 MED ORDER — PROPOFOL 10 MG/ML IV BOLUS
INTRAVENOUS | Status: AC
  Filled 2014-12-11: qty 20

## 2014-12-11 MED ORDER — DEXTROSE 50 % IV SOLN
INTRAVENOUS | Status: AC
  Filled 2014-12-11: qty 50

## 2014-12-11 MED ORDER — PROPOFOL 10 MG/ML IV BOLUS
INTRAVENOUS | Status: DC | PRN
  Administered 2014-12-11 (×2): 10 mg via INTRAVENOUS
  Administered 2014-12-11: 20 mg via INTRAVENOUS
  Administered 2014-12-11 (×2): 10 mg via INTRAVENOUS
  Administered 2014-12-11: 20 mg via INTRAVENOUS
  Administered 2014-12-11 (×2): 10 mg via INTRAVENOUS
  Administered 2014-12-11: 30 mg via INTRAVENOUS
  Administered 2014-12-11: 10 mg via INTRAVENOUS
  Administered 2014-12-11: 20 mg via INTRAVENOUS
  Administered 2014-12-11 (×4): 10 mg via INTRAVENOUS

## 2014-12-11 MED ORDER — LIDOCAINE HCL 1 % IJ SOLN
INTRAMUSCULAR | Status: DC | PRN
  Administered 2014-12-11: 40 mg via INTRADERMAL

## 2014-12-11 MED ORDER — LIDOCAINE HCL (CARDIAC) 20 MG/ML IV SOLN
INTRAVENOUS | Status: AC
  Filled 2014-12-11: qty 5

## 2014-12-11 MED ORDER — MIDAZOLAM HCL 2 MG/2ML IJ SOLN
INTRAMUSCULAR | Status: AC
  Filled 2014-12-11: qty 4

## 2014-12-11 MED ORDER — PROMETHAZINE HCL 25 MG/ML IJ SOLN: 6.2500 mg | INTRAMUSCULAR | Status: DC | PRN

## 2014-12-11 SURGICAL SUPPLY — 22 items

## 2014-12-11 NOTE — Anesthesia Postprocedure Evaluation (Signed)
  Anesthesia Post-op Note  Patient: Phyllis Conner  Procedure(s) Performed: Procedure(s) (LRB): COLONOSCOPY WITH PROPOFOL (N/A)  Patient Location: PACU  Anesthesia Type: MAC  Level of Consciousness: awake and alert   Airway and Oxygen Therapy: Patient Spontanous Breathing  Post-op Pain: mild  Post-op Assessment: Post-op Vital signs reviewed, Patient's Cardiovascular Status Stable, Respiratory Function Stable, Patent Airway and No signs of Nausea or vomiting  Last Vitals:  Filed Vitals:   12/11/14 1300  BP: 157/69  Pulse:   Temp:   Resp:     Post-op Vital Signs: stable   Complications: No apparent anesthesia complications

## 2014-12-11 NOTE — Anesthesia Preprocedure Evaluation (Addendum)
Anesthesia Evaluation  Patient identified by MRN, date of birth, ID band Patient awake    Reviewed: Allergy & Precautions, NPO status , Patient's Chart, lab work & pertinent test results  Airway Mallampati: II  TM Distance: >3 FB Neck ROM: Full    Dental no notable dental hx. (+) Missing   Pulmonary neg pulmonary ROS,  breath sounds clear to auscultation  Pulmonary exam normal       Cardiovascular hypertension, Pt. on medications + CAD, + Past MI and + Cardiac Stents (2014 DES) Normal cardiovascular examRhythm:Regular Rate:Normal     Neuro/Psych negative neurological ROS  negative psych ROS   GI/Hepatic negative GI ROS, Neg liver ROS,   Endo/Other  diabetes, Type 2, Insulin Dependent  Renal/GU negative Renal ROS  negative genitourinary   Musculoskeletal negative musculoskeletal ROS (+)   Abdominal   Peds negative pediatric ROS (+)  Hematology negative hematology ROS (+)   Anesthesia Other Findings   Reproductive/Obstetrics negative OB ROS                            Anesthesia Physical Anesthesia Plan  ASA: III  Anesthesia Plan: MAC   Post-op Pain Management:    Induction:   Airway Management Planned: Simple Face Mask  Additional Equipment:   Intra-op Plan:   Post-operative Plan:   Informed Consent: I have reviewed the patients History and Physical, chart, labs and discussed the procedure including the risks, benefits and alternatives for the proposed anesthesia with the patient or authorized representative who has indicated his/her understanding and acceptance.   Dental advisory given  Plan Discussed with: CRNA  Anesthesia Plan Comments:         Anesthesia Quick Evaluation

## 2014-12-11 NOTE — Discharge Instructions (Signed)
Colonoscopy, Care After °These instructions give you information on caring for yourself after your procedure. Your doctor may also give you more specific instructions. Call your doctor if you have any problems or questions after your procedure. °HOME CARE °· Do not drive for 24 hours. °· Do not sign important papers or use machinery for 24 hours. °· You may shower. °· You may go back to your usual activities, but go slower for the first 24 hours. °· Take rest breaks often during the first 24 hours. °· Walk around or use warm packs on your belly (abdomen) if you have belly cramping or gas. °· Drink enough fluids to keep your pee (urine) clear or pale yellow. °· Resume your normal diet. Avoid heavy or fried foods. °· Avoid drinking alcohol for 24 hours or as told by your doctor. °· Only take medicines as told by your doctor. °If a tissue sample (biopsy) was taken during the procedure:  °· Do not take aspirin or blood thinners for 7 days, or as told by your doctor. °· Do not drink alcohol for 7 days, or as told by your doctor. °· Eat soft foods for the first 24 hours. °GET HELP IF: °You still have a small amount of blood in your poop (stool) 2-3 days after the procedure. °GET HELP RIGHT AWAY IF: °· You have more than a small amount of blood in your poop. °· You see clumps of tissue (blood clots) in your poop. °· Your belly is puffy (swollen). °· You feel sick to your stomach (nauseous) or throw up (vomit). °· You have a fever. °· You have belly pain that gets worse and medicine does not help. °MAKE SURE YOU: °· Understand these instructions. °· Will watch your condition. °· Will get help right away if you are not doing well or get worse. °Document Released: 04/29/2010 Document Revised: 04/01/2013 Document Reviewed: 12/02/2012 °ExitCare® Patient Information ©2015 [employer]. This information is not intended to replace advice given to you by your health care provider. Make sure you discuss any questions you have with  your health care provider. ° °

## 2014-12-11 NOTE — Op Note (Signed)
[facility] [place], [address]   COLONOSCOPY PROCEDURE REPORT  PATIENT: Phyllis Conner, Phyllis Conner  MR#: [id number] BIRTHDATE: 11-17-1945 , 15  yrs. old GENDER: female ENDOSCOPIST: Carol Ada, MD REFERRED BY: PROCEDURE DATE:  2015-01-03 PROCEDURE:   Colonoscopy with snare polypectomy ASA CLASS:   Class III INDICATIONS: Ascending colon polyp MEDICATIONS: Monitored anesthesia care  DESCRIPTION OF PROCEDURE:   After the risks and benefits and of the procedure were explained, informed consent was obtained.  revealed no abnormalities of the rectum.    The EC-[id number] ([id number]) endoscope was introduced through the anus and advanced to the cecum, which was identified by both the appendix and ileocecal valve .  The quality of the prep was excellent. .  The instrument was then slowly withdrawn as the colon was fully examined. Estimated blood loss is zero unless otherwise noted in this procedure report.   FINDINGS: The 5 mm sessile ascending colon polyp was identified and removed with a hot snare.  Because of he need for Brilinta, I placed a hemoclip across the polypectomy site.  Left sided diverticula were identified.     Retroflexed views revealed no abnormalities.     The scope was then withdrawn from the patient and the procedure completed.  WITHDRAWAL TIME: 11 minutes.  COMPLICATIONS: There were no immediate complications. ENDOSCOPIC IMPRESSION: 1) Ascending colon polyp. 2) Diverticula. RECOMMENDATIONS: 1) Follow up biopsies. 2) Repeat the colonoscopy in 5 years. 3) Okay to restart Brilinta tomorrow morning.  REPEAT EXAM:  cc:  _______________________________ eSignedCarol Ada, MD 2015-01-03 12:28 PM   CPT CODES: ICD CODES:  The ICD and CPT codes recommended by this software are interpretations from the data that the clinical staff has captured with the software.  The verification of the translation of this report to the ICD and CPT  codes and modifiers is the sole responsibility of the health care institution and practicing physician where this report was generated.  [employer] will not be held responsible for the validity of the ICD and CPT codes included on this report.  AMA assumes no liability for data contained or not contained herein. CPT is a Designer, television/film set of the [employer].   PATIENT NAME:  [name], [name] MR#: [id number]

## 2014-12-11 NOTE — Transfer of Care (Signed)
Immediate Anesthesia Transfer of Care Note  Patient: Phyllis Conner  Procedure(s) Performed: Procedure(s): COLONOSCOPY WITH PROPOFOL (N/A)  Patient Location: PACU and Endoscopy Unit  Anesthesia Type:MAC  Level of Consciousness: awake, oriented and patient cooperative  Airway & Oxygen Therapy: Patient Spontanous Breathing and Patient connected to face mask oxygen  Post-op Assessment: Report given to RN and Post -op Vital signs reviewed and stable  Post vital signs: Reviewed and stable  Last Vitals:  Filed Vitals:   12/11/14 1104  BP: 151/76  Temp: 36.6 C  Resp: 12    Complications: No apparent anesthesia complications

## 2014-12-13 ENCOUNTER — Encounter (HOSPITAL_COMMUNITY): Payer: Self-pay | Admitting: Gastroenterology

## 2014-12-23 DIAGNOSIS — I119 Hypertensive heart disease without heart failure: Secondary | ICD-10-CM | POA: Diagnosis not present

## 2014-12-23 DIAGNOSIS — E1165 Type 2 diabetes mellitus with hyperglycemia: Secondary | ICD-10-CM | POA: Diagnosis not present

## 2014-12-23 DIAGNOSIS — E1300 Other specified diabetes mellitus with hyperosmolarity without nonketotic hyperglycemic-hyperosmolar coma (NKHHC): Secondary | ICD-10-CM | POA: Diagnosis not present

## 2014-12-28 ENCOUNTER — Other Ambulatory Visit: Payer: Self-pay | Admitting: Cardiology

## 2014-12-28 NOTE — Telephone Encounter (Signed)
Rx request sent to pharmacy.  

## 2015-01-10 ENCOUNTER — Other Ambulatory Visit: Payer: Self-pay | Admitting: Cardiology

## 2015-01-11 NOTE — Telephone Encounter (Signed)
Rx(s) sent to pharmacy electronically.  

## 2015-04-29 DIAGNOSIS — I119 Hypertensive heart disease without heart failure: Secondary | ICD-10-CM | POA: Diagnosis not present

## 2015-04-29 DIAGNOSIS — E1169 Type 2 diabetes mellitus with other specified complication: Secondary | ICD-10-CM | POA: Diagnosis not present

## 2015-07-22 ENCOUNTER — Other Ambulatory Visit: Payer: Self-pay | Admitting: Cardiology

## 2015-08-27 DIAGNOSIS — R5383 Other fatigue: Secondary | ICD-10-CM | POA: Diagnosis not present

## 2015-08-27 DIAGNOSIS — I119 Hypertensive heart disease without heart failure: Secondary | ICD-10-CM | POA: Diagnosis not present

## 2015-08-27 DIAGNOSIS — G4700 Insomnia, unspecified: Secondary | ICD-10-CM | POA: Diagnosis not present

## 2015-08-27 DIAGNOSIS — E1300 Other specified diabetes mellitus with hyperosmolarity without nonketotic hyperglycemic-hyperosmolar coma (NKHHC): Secondary | ICD-10-CM | POA: Diagnosis not present

## 2015-09-30 DIAGNOSIS — Z0000 Encounter for general adult medical examination without abnormal findings: Secondary | ICD-10-CM | POA: Diagnosis not present

## 2015-09-30 DIAGNOSIS — G479 Sleep disorder, unspecified: Secondary | ICD-10-CM | POA: Diagnosis not present

## 2015-09-30 DIAGNOSIS — E118 Type 2 diabetes mellitus with unspecified complications: Secondary | ICD-10-CM | POA: Diagnosis not present

## 2015-10-12 ENCOUNTER — Other Ambulatory Visit: Payer: Self-pay | Admitting: Cardiology

## 2015-10-13 NOTE — Telephone Encounter (Signed)
Rx Refill

## 2015-10-25 ENCOUNTER — Ambulatory Visit (INDEPENDENT_AMBULATORY_CARE_PROVIDER_SITE_OTHER): Payer: Medicare Other | Admitting: Cardiology

## 2015-10-25 ENCOUNTER — Encounter: Payer: Self-pay | Admitting: Cardiology

## 2015-10-25 VITALS — BP 138/71 | HR 54 | Ht 63.0 in | Wt 202.0 lb

## 2015-10-25 DIAGNOSIS — E669 Obesity, unspecified: Secondary | ICD-10-CM

## 2015-10-25 DIAGNOSIS — I214 Non-ST elevation (NSTEMI) myocardial infarction: Secondary | ICD-10-CM

## 2015-10-25 DIAGNOSIS — E1169 Type 2 diabetes mellitus with other specified complication: Secondary | ICD-10-CM

## 2015-10-25 DIAGNOSIS — E785 Hyperlipidemia, unspecified: Secondary | ICD-10-CM

## 2015-10-25 DIAGNOSIS — E119 Type 2 diabetes mellitus without complications: Secondary | ICD-10-CM

## 2015-10-25 DIAGNOSIS — I10 Essential (primary) hypertension: Secondary | ICD-10-CM | POA: Diagnosis not present

## 2015-10-25 DIAGNOSIS — I951 Orthostatic hypotension: Secondary | ICD-10-CM

## 2015-10-25 DIAGNOSIS — I2510 Atherosclerotic heart disease of native coronary artery without angina pectoris: Secondary | ICD-10-CM | POA: Diagnosis not present

## 2015-10-25 DIAGNOSIS — Z9861 Coronary angioplasty status: Secondary | ICD-10-CM

## 2015-10-25 NOTE — Patient Instructions (Signed)
Your physician wants you to follow-up in: 1 year or sooner if needed. You will receive a reminder letter in the mail two months in advance. If you don't receive a letter, please call our office to schedule the follow-up appointment.   If you need a refill on your cardiac medications before your next appointment, please call your pharmacy.   

## 2015-10-25 NOTE — Progress Notes (Signed)
PCP: Elyn Peers, MD  Clinic Note: Chief Complaint  Patient presents with  . Follow-up    pt c/o chest discomfort and swelling in left ankle  . Coronary Artery Disease    H/O NSTEMI - PCI.    HPI: Phyllis Conner is a 70 y.o. female with a history of CAD status post PCI with stenting of the non-STEMI in November 2014. She presents today for 51yrfollow-up. Last seen in July 2017.   Recent Hospitalizations: None  Studies Reviewed: None  Interval History:  Phyllis Conner presents today really doing pretty well she describes some funny sensation in her chest. It is usually, burning sensation that occurs when she lies down at a time. Last maybe 10-15 minutes. It usually gets better if she belches or sits up. The discomfort is nonexertional. - This is most consistent with GERD She has gotten back into her exercise walking about 5 days a week for about a mile at a time. She denies  any of her anginal type chest tightness or pressure. A lot of her activities limited by knee pain mostly in the right side. In trying to lose weight but is having a hard time, having gained some weight that she is now trying to lose in addition to the previously she was trying to lose.  No PND, orthopnea or edema. No palpitations, lightheadedness, dizziness, weakness or syncope/near syncope. No TIA/amaurosis fugax symptoms. No melena, hematochezia, hematuria, or epstaxis. No claudication.  ROS: A comprehensive was performed. Review of Systems  Constitutional: Negative for weight loss.  Respiratory: Negative for cough, shortness of breath and wheezing.   Cardiovascular: Negative.        Per history of present illness  Gastrointestinal: Positive for heartburn. Negative for nausea and abdominal pain.  Musculoskeletal: Positive for joint pain (Bilateral knee, right greater than left.). Negative for myalgias and falls.  Neurological: Negative for dizziness.  Endo/Heme/Allergies: Does not bruise/bleed easily.    Psychiatric/Behavioral: Negative for depression and memory loss. The patient is not nervous/anxious and does not have insomnia.   All other systems reviewed and are negative.   Past Medical History  Diagnosis Date  . Diabetes mellitus type 2 in obese ([place]   . Essential hypertension   . Hyperlipidemia with target LDL less than 70   . Non-Q wave ST elevation myocardial infarction (STEMI) involving left anterior descending (LAD) coronary artery November 2014    PCI to LAD and circumflex  . CAD S/P percutaneous coronary angioplasty November 2014    Mid LAD 2.5 mm x 12 mm Promus DES (2.70 mm); proximal OM1 - Promus DES 2.75 mm x 16 mm (2.85 mm)  . Obesity (BMI 30-39.9) 05/05/2013    Past Surgical History  Procedure Laterality Date  . Abdominal hysterectomy  1990s  . Arthroscopic knee surgery Bilateral 1984, 2002    Arthroscopic  . Cardiac catheterization  November 2014    Mid LAD 95%, proximal OM1 80-90% --> two-vessel PCI  . Percutaneous coronary stent intervention (pci-s)  November 2014    Mid LAD 2.5 mm x 12 mm (2.75 mm) Promus DES; OM1 2.75 mm x 60 mm (2.85 mm) Promus DES  . Transthoracic echocardiogram  02/16/2013    EF 40-50%. Moderate concentric LVH. Moderate H. Shea of mid-distal septal and apical region. Grade 1 diastolic dysfunction. Moderate aortic sclerosis without stenosis.  . Esophagogastroduodenoscopy N/A 12/05/2013    Procedure: ESOPHAGOGASTRODUODENOSCOPY (EGD);  Surgeon: PBeryle Beams MD;  [facility];  Service: Endoscopy;  Laterality: N/A;  .  Colonoscopy N/A 12/05/2013    Procedure: COLONOSCOPY;  Surgeon: Beryle Beams, MD;  Location: [facility];  Service: Endoscopy;  Laterality: N/A;  . Left heart catheterization with coronary angiogram N/A 02/17/2013    Procedure: LEFT HEART CATHETERIZATION WITH CORONARY ANGIOGRAM;  Surgeon: Sanda Klein, MD;  Location: [facility];  Service: Cardiovascular;  Laterality: N/A;  . Colonoscopy with propofol N/A 12/11/2014     Procedure: COLONOSCOPY WITH PROPOFOL;  Surgeon: Carol Ada, MD;  Location: [facility];  Service: Endoscopy;  Laterality: N/A;    Prior to Admission medications   Medication Sig Start Date End Date Taking? Authorizing Provider  ACCU-CHEK AVIVA PLUS test strip 1 each by Other route 2 (two) times daily.  01/08/14  Yes Historical Provider, MD  amLODipine (NORVASC) 5 MG tablet TAKE 1 TABLET BY MOUTH DAILY 10/13/15  Yes Leonie Man, MD  atorvastatin (LIPITOR) 80 MG tablet TAKE 1 TABLET BY MOUTH DAILY AT 6 PM 10/13/15  Yes Leonie Man, MD  BRILINTA 90 MG TABS tablet TAKE 1 TABLET BY MOUTH TWICE DAILY 07/22/15  Yes Leonie Man, MD  HUMALOG 100 UNIT/ML injection 1 application daily. 10/01/15  Yes Historical Provider, MD  Insulin Disposable Pump (V-GO 20) KIT Inject 10 Units into the skin every morning. Humalog 01/14/14  Yes Historical Provider, MD  INVESTIGATIONAL DRUG SIMPLE RECORD [employer] :[id number]  STUDY DRUG.  TAKE 1 TABLET TWICE A DAY. (Lorcaserin vs. Placebo)   Yes Historical Provider, MD  isosorbide mononitrate (IMDUR) 30 MG 24 hr tablet TAKE 1 TABLET BY MOUTH EVERY DAY 10/13/15  Yes Leonie Man, MD  metFORMIN (GLUCOPHAGE) 500 MG tablet Take 1 tablet (500 mg total) by mouth daily. 02/20/13  Yes Erlene Quan, PA-C  metoprolol tartrate (LOPRESSOR) 25 MG tablet TAKE 1/2 TABLET BY MOUTH TWICE DAILY 10/13/15  Yes Leonie Man, MD  nitroGLYCERIN (NITROSTAT) 0.4 MG SL tablet Place 1 tablet (0.4 mg total) under the tongue every 5 (five) minutes x 3 doses as needed for chest pain. 02/18/13  Yes Luke K Kilroy, PA-C  potassium chloride SA (K-DUR,KLOR-CON) 20 MEQ tablet Take 1 tablet by mouth every morning.  09/14/14  Yes Historical Provider, MD  sitaGLIPtin (JANUVIA) 100 MG tablet Take 100 mg by mouth every morning.    Yes Historical Provider, MD  temazepam (RESTORIL) 30 MG capsule Take 1 capsule by mouth at bedtime as needed for sleep.  08/24/14  Yes Historical Provider, MD      Allergies  Allergen Reactions  . Iohexol Hives and Other (See Comments)    Excessive sweating     Social History   Social History  . Marital Status: Single    Spouse Name: N/A  . Number of Children: N/A  . Years of Education: N/A   Social History Main Topics  . Smoking status: Never Smoker   . Smokeless tobacco: Never Used  . Alcohol Use: No  . Drug Use: No  . Sexual Activity: Not Asked   Other Topics Concern  . None   Social History Narrative   She is married with 4 children.   She does not smoke and does not drink alcohol.   Family History  Problem Relation Age of Onset  . Heart disease Sister   . Heart disease Brother   . Heart disease Mother   . Heart attack Mother   . Asthma Mother   . Cancer Father     prostate cancer     Wt Readings from Last 3  Encounters:  10/25/15 202 lb (91.627 kg)  12/11/14 218 lb (98.884 kg)  10/21/14 180 lb 4.8 oz (81.784 kg)    PHYSICAL EXAM BP 138/71 mmHg  Pulse 54  Ht 5' 3"  (1.6 m)  Wt 202 lb (91.627 kg)  BMI 35.79 kg/m2  SpO2 99% General appearance: alert, cooperative, appears stated age, no distress, mildly obese and well-nourished and well-groomed. Normal moood and affect.  Neck: no adenopathy, soft right carotid bruit (quite likely radiated murmur), no JVD, supple, symmetrical, trachea midline and thyroid not enlarged, symmetric, no tenderness/mass/nodules  Lungs: CTAB, normal percussion bilaterally and Nonlabored, good air movement  Heart: RRR, A4&Z 2 normal, no click, rub or gallop and normal apical impulse; 2/6 SEM @ RUSB (c-d)  Abdomen: soft, non-tender; bowel sounds normal; no masses, no HSM; Mild clinical obesity  Extremities: extremities normal, atraumatic, no cyanosis or edema and Mild bruising  Pulses: 2+ and symmetric  Neurologic: A&O X 3, normal strength and tone. Normal symmetric reflexes. Normal coordination and gait    Adult ECG Report  Rate: 54 ;  Rhythm: sinus bradycardia and Otherwise normal  axis, intervals and durations.;   Narrative Interpretation: Normal EKG. Stable  Other studies Reviewed: Additional studies/ records that were reviewed today include:  Recent Labs:  Recent labs not available. Lab Results  Component Value Date   CHOL 156 08/18/2013   HDL 50 08/18/2013   LDLCALC 88 08/18/2013   TRIG 88 08/18/2013   CHOLHDL 3.1 08/18/2013     ASSESSMENT / PLAN: Problem List Items Addressed This Visit    Orthostatic hypotension (Chronic)    No recurrent episodes. However would be reluctant to titrate up antihypertensive agents.      Relevant Orders   EKG 12-Lead (Completed)   Obesity (BMI 30-39.9) (Chronic)    The patient understands the need to lose weight with diet and exercise. We have discussed specific strategies for this. She is now at least doing the exercise. Now she needs to monitor her diet. We talked about potential options such as the Mediterranean or DASH diets.  Combination of hypertension, obesity and diabetes combined to be metabolic syndrome. Therefore we need aggressive risk factor modification.      Relevant Medications   HUMALOG 100 UNIT/ML injection   Other Relevant Orders   EKG 12-Lead (Completed)   NSTEMI (non-ST elevated myocardial infarction) (HCC) (Chronic)    No recurrent anginal symptoms. No heart failure symptoms. Preserved EF by LV gram. She has not had an echo since 2014.      Relevant Orders   EKG 12-Lead (Completed)   Hyperlipidemia with target LDL less than 70    On high-dose statin with labs being monitored by PCP. I have not seen recent labs, but would hope that maybe we can reduce the dose to 40 mg if her lipids look okay.      Relevant Orders   EKG 12-Lead (Completed)   Essential hypertension (Chronic)    Stable on current meds. No need to titrate.      Relevant Orders   EKG 12-Lead (Completed)   Diabetes mellitus type 2 in obese (HCC)    Followed closely by PCP. On multiple meds.      Relevant Medications    HUMALOG 100 UNIT/ML injection   CAD S/P percutaneous coronary angioplasty -- LAD/OM1 DES 02/17/13 - Primary (Chronic)    Stable with no active anginal symptoms. She is on Brilinta without aspirin. She is on high-dose statin with her labs followed by PCP. On  beta blocker and calcium channel blocker. Unable to titrate beta blocker secondary to bradycardia. Also on low-dose Imdur.      Relevant Orders   EKG 12-Lead (Completed)      Current medicines are reviewed at length with the patient today. (+/- concerns) n/a The following changes have been made: n/a Studies Ordered:   Orders Placed This Encounter  Procedures  . EKG 12-Lead    ROV - 1 year or sooner if needed.  Glenetta Hew, M.D., M.S. Interventional Cardiologist   Pager # [phone] Phone # [phone] [address] [place], [place] [address]

## 2015-10-27 ENCOUNTER — Encounter: Payer: Self-pay | Admitting: Cardiology

## 2015-10-27 NOTE — Assessment & Plan Note (Signed)
Followed closely by PCP. On multiple meds.

## 2015-10-27 NOTE — Assessment & Plan Note (Signed)
Stable with no active anginal symptoms. She is on Brilinta without aspirin. She is on high-dose statin with her labs followed by PCP. On beta blocker and calcium channel blocker. Unable to titrate beta blocker secondary to bradycardia. Also on low-dose Imdur.

## 2015-10-27 NOTE — Assessment & Plan Note (Signed)
No recurrent episodes. However would be reluctant to titrate up antihypertensive agents.

## 2015-10-27 NOTE — Assessment & Plan Note (Signed)
No recurrent anginal symptoms. No heart failure symptoms. Preserved EF by LV gram. She has not had an echo since 2014.

## 2015-10-27 NOTE — Assessment & Plan Note (Signed)
The patient understands the need to lose weight with diet and exercise. We have discussed specific strategies for this. She is now at least doing the exercise. Now she needs to monitor her diet. We talked about potential options such as the Mediterranean or DASH diets.  Combination of hypertension, obesity and diabetes combined to be metabolic syndrome. Therefore we need aggressive risk factor modification.

## 2015-10-27 NOTE — Assessment & Plan Note (Signed)
Stable on current meds. No need to titrate.

## 2015-10-27 NOTE — Assessment & Plan Note (Signed)
On high-dose statin with labs being monitored by PCP. I have not seen recent labs, but would hope that maybe we can reduce the dose to 40 mg if her lipids look okay.

## 2015-11-11 DIAGNOSIS — I119 Hypertensive heart disease without heart failure: Secondary | ICD-10-CM | POA: Diagnosis not present

## 2015-11-11 DIAGNOSIS — E118 Type 2 diabetes mellitus with unspecified complications: Secondary | ICD-10-CM | POA: Diagnosis not present

## 2015-12-02 DIAGNOSIS — E118 Type 2 diabetes mellitus with unspecified complications: Secondary | ICD-10-CM | POA: Diagnosis not present

## 2015-12-02 DIAGNOSIS — M130 Polyarthritis, unspecified: Secondary | ICD-10-CM | POA: Diagnosis not present

## 2015-12-02 DIAGNOSIS — I119 Hypertensive heart disease without heart failure: Secondary | ICD-10-CM | POA: Diagnosis not present

## 2015-12-21 ENCOUNTER — Other Ambulatory Visit: Payer: Self-pay | Admitting: Cardiology

## 2015-12-28 ENCOUNTER — Other Ambulatory Visit: Payer: Self-pay | Admitting: Cardiology

## 2015-12-28 NOTE — Telephone Encounter (Signed)
Rx request sent to pharmacy.  

## 2016-01-08 ENCOUNTER — Other Ambulatory Visit: Payer: Self-pay | Admitting: Cardiology

## 2016-01-10 NOTE — Telephone Encounter (Signed)
Rx request sent to pharmacy.  

## 2016-01-13 DIAGNOSIS — G4700 Insomnia, unspecified: Secondary | ICD-10-CM | POA: Diagnosis not present

## 2016-01-13 DIAGNOSIS — I10 Essential (primary) hypertension: Secondary | ICD-10-CM | POA: Diagnosis not present

## 2016-01-13 DIAGNOSIS — E1165 Type 2 diabetes mellitus with hyperglycemia: Secondary | ICD-10-CM | POA: Diagnosis not present

## 2016-03-04 ENCOUNTER — Other Ambulatory Visit: Payer: Self-pay | Admitting: Cardiology

## 2016-03-06 NOTE — Telephone Encounter (Signed)
Rx has been sent to the pharmacy electronically. ° °

## 2016-03-13 DIAGNOSIS — E118 Type 2 diabetes mellitus with unspecified complications: Secondary | ICD-10-CM | POA: Diagnosis not present

## 2016-03-13 DIAGNOSIS — M130 Polyarthritis, unspecified: Secondary | ICD-10-CM | POA: Diagnosis not present

## 2016-03-13 DIAGNOSIS — I119 Hypertensive heart disease without heart failure: Secondary | ICD-10-CM | POA: Diagnosis not present

## 2016-04-06 ENCOUNTER — Other Ambulatory Visit: Payer: Self-pay | Admitting: Cardiology

## 2016-04-07 NOTE — Telephone Encounter (Signed)
Rx has been sent to the pharmacy electronically. ° °

## 2016-06-05 DIAGNOSIS — M1380 Other specified arthritis, unspecified site: Secondary | ICD-10-CM | POA: Diagnosis not present

## 2016-06-05 DIAGNOSIS — E118 Type 2 diabetes mellitus with unspecified complications: Secondary | ICD-10-CM | POA: Diagnosis not present

## 2016-06-12 DIAGNOSIS — I10 Essential (primary) hypertension: Secondary | ICD-10-CM | POA: Diagnosis not present

## 2016-06-12 DIAGNOSIS — M150 Primary generalized (osteo)arthritis: Secondary | ICD-10-CM | POA: Diagnosis not present

## 2016-07-17 ENCOUNTER — Other Ambulatory Visit (HOSPITAL_COMMUNITY): Payer: Self-pay | Admitting: Family Medicine

## 2016-07-17 DIAGNOSIS — Z1231 Encounter for screening mammogram for malignant neoplasm of breast: Secondary | ICD-10-CM

## 2016-07-26 ENCOUNTER — Ambulatory Visit (HOSPITAL_COMMUNITY)
Admission: RE | Admit: 2016-07-26 | Discharge: 2016-07-26 | Disposition: A | Discharge status: Home / Self Care | Payer: Medicare Other | Source: Ambulatory Visit | Attending: Family Medicine | Admitting: Family Medicine

## 2016-07-26 DIAGNOSIS — Z1231 Encounter for screening mammogram for malignant neoplasm of breast: Secondary | ICD-10-CM | POA: Diagnosis not present

## 2016-10-10 DIAGNOSIS — I10 Essential (primary) hypertension: Secondary | ICD-10-CM | POA: Diagnosis not present

## 2016-10-10 DIAGNOSIS — M130 Polyarthritis, unspecified: Secondary | ICD-10-CM | POA: Diagnosis not present

## 2016-10-24 ENCOUNTER — Ambulatory Visit (INDEPENDENT_AMBULATORY_CARE_PROVIDER_SITE_OTHER): Payer: Medicare Other | Admitting: Cardiology

## 2016-10-24 ENCOUNTER — Encounter: Payer: Self-pay | Admitting: Cardiology

## 2016-10-24 ENCOUNTER — Other Ambulatory Visit: Payer: Self-pay | Admitting: Cardiology

## 2016-10-24 VITALS — BP 150/84 | HR 72 | Ht 64.0 in | Wt 232.8 lb

## 2016-10-24 DIAGNOSIS — I2510 Atherosclerotic heart disease of native coronary artery without angina pectoris: Secondary | ICD-10-CM

## 2016-10-24 DIAGNOSIS — E785 Hyperlipidemia, unspecified: Secondary | ICD-10-CM

## 2016-10-24 DIAGNOSIS — Z9861 Coronary angioplasty status: Principal | ICD-10-CM

## 2016-10-24 DIAGNOSIS — I214 Non-ST elevation (NSTEMI) myocardial infarction: Secondary | ICD-10-CM | POA: Diagnosis not present

## 2016-10-24 DIAGNOSIS — I10 Essential (primary) hypertension: Secondary | ICD-10-CM

## 2016-10-24 DIAGNOSIS — I951 Orthostatic hypotension: Secondary | ICD-10-CM | POA: Diagnosis not present

## 2016-10-24 DIAGNOSIS — E669 Obesity, unspecified: Secondary | ICD-10-CM

## 2016-10-24 MED ORDER — TICAGRELOR 60 MG PO TABS: 60.0000 mg | ORAL_TABLET | Freq: Two times a day (BID) | ORAL | 3 refills | Status: DC

## 2016-10-24 MED ORDER — METOPROLOL TARTRATE 25 MG PO TABS: 25.0000 mg | ORAL_TABLET | Freq: Two times a day (BID) | ORAL | 3 refills | Status: DC

## 2016-10-24 NOTE — Progress Notes (Signed)
PCP: Lucianne Lei, MD  Clinic Note: Chief Complaint  Patient presents with  . Follow-up    short of breath when lying down & walking fast  . Coronary Artery Disease    HPI: Phyllis Conner is a 71 y.o. female with a PMH below who presents today for Annual follow-up of CAD-PCI with severe non-STEMI in November 2014.Phyllis Conner was last seen on 10/25/2015. She noted occasionally having a burning sensation in her chest when she lies down lasting 10-15 minutes of time. Usually nonexertional. No anginal type exertional chest pain. She was exercising 5 days a week.  Recent Hospitalizations: None  Studies Personally Reviewed - (if available, images/films reviewed: From Epic Chart or Care Everywhere)  None  Interval History: He presents today overall doing fairly well. Her major concern is that she has gained weight as a result of her knee pain. She just is not able to do the same exercise because of her knee and therefore has led herself gained back the weight that she works hard to lose. She does as a result of increased weight have a little bit more exertional dyspnea and low-grade of orthopnea that she had not had before, but no PND or significant edema. No resting or exertional chest tightness or pressure consistent with angina. She denies any rapid irregular heartbeats or palpitations, near syncope or syncope, TIAs amaurosis fugax. Usually at home her blood pressures run in the 130 mmHg range systolic but acknowledges that she did not take her medicines yet today.  No melena, hematochezia, hematuria, or epstaxis. No claudication.  ROS: A comprehensive was performed. Review of Systems  Constitutional: Negative for malaise/fatigue and weight loss (30 lb wgt gain).  HENT: Negative for congestion and nosebleeds.   Respiratory: Negative for cough, shortness of breath and wheezing.   Cardiovascular:       Per HPI  Gastrointestinal: Negative for heartburn and nausea.    Musculoskeletal: Positive for joint pain (B knees).  Neurological: Negative.   Endo/Heme/Allergies: Negative for environmental allergies.  Psychiatric/Behavioral: The patient is not nervous/anxious and does not have insomnia.   All other systems reviewed and are negative.   I have reviewed and (if needed) personally updated the patient's problem list, medications, allergies, past medical and surgical history, social and family history.   Past Medical History:  Diagnosis Date  . CAD S/P percutaneous coronary angioplasty November 2014   Mid LAD 2.5 mm x 12 mm Promus DES (2.70 mm); proximal OM1 - Promus DES 2.75 mm x 16 mm (2.85 mm)  . Diabetes mellitus type 2 in obese ([place])   . Essential hypertension   . Hyperlipidemia with target LDL less than 70   . Non-Q wave ST elevation myocardial infarction (STEMI) involving left anterior descending (LAD) coronary artery November 2014   PCI to LAD and circumflex  . Obesity (BMI 30-39.9) 05/05/2013    Past Surgical History:  Procedure Laterality Date  . ABDOMINAL HYSTERECTOMY  1990s  . Arthroscopic knee surgery Bilateral 1984, 2002   Arthroscopic  . CARDIAC CATHETERIZATION  November 2014   Mid LAD 95%, proximal OM1 80-90% --> two-vessel PCI  . COLONOSCOPY N/A 12/05/2013   Procedure: COLONOSCOPY;  Surgeon: Beryle Beams, MD;  Location: [facility];  Service: Endoscopy;  Laterality: N/A;  . COLONOSCOPY WITH PROPOFOL N/A 12/11/2014   Procedure: COLONOSCOPY WITH PROPOFOL;  Surgeon: Carol Ada, MD;  Location: [facility];  Service: Endoscopy;  Laterality: N/A;  . ESOPHAGOGASTRODUODENOSCOPY N/A 12/05/2013  Procedure: ESOPHAGOGASTRODUODENOSCOPY (EGD);  Surgeon: Beryle Beams, MD;  [facility];  Service: Endoscopy;  Laterality: N/A;  . LEFT HEART CATHETERIZATION WITH CORONARY ANGIOGRAM N/A 02/17/2013   Procedure: LEFT HEART CATHETERIZATION WITH CORONARY ANGIOGRAM;  Surgeon: Sanda Klein, MD;  Location: [facility];  Service:  Cardiovascular;  Laterality: N/A;  . PERCUTANEOUS CORONARY STENT INTERVENTION (PCI-S)  November 2014   Mid LAD 2.5 mm x 12 mm (2.75 mm) Promus DES; OM1 2.75 mm x 60 mm (2.85 mm) Promus DES  . TRANSTHORACIC ECHOCARDIOGRAM  02/16/2013   EF 40-50%. Moderate concentric LVH. Moderate H. Shea of mid-distal septal and apical region. Grade 1 diastolic dysfunction. Moderate aortic sclerosis without stenosis.    Current Meds  Medication Sig  . ACCU-CHEK AVIVA PLUS test strip 1 each by Other route 2 (two) times daily.   Marland Kitchen amLODipine (NORVASC) 5 MG tablet TAKE 1 TABLET BY MOUTH DAILY  . atorvastatin (LIPITOR) 80 MG tablet TAKE 1 TABLET BY MOUTH DAILY AT 6 PM  . HUMALOG 100 UNIT/ML injection 1 application daily.  . Insulin Disposable Pump (V-GO 20) KIT Inject 10 Units into the skin every morning. Humalog  . isosorbide mononitrate (IMDUR) 30 MG 24 hr tablet TAKE 1 TABLET BY MOUTH EVERY DAY  . metFORMIN (GLUCOPHAGE) 500 MG tablet Take 1 tablet (500 mg total) by mouth daily.  . nitroGLYCERIN (NITROSTAT) 0.4 MG SL tablet Place 1 tablet (0.4 mg total) under the tongue every 5 (five) minutes x 3 doses as needed for chest pain.  . potassium chloride SA (K-DUR,KLOR-CON) 20 MEQ tablet Take 1 tablet by mouth every morning.   . sitaGLIPtin (JANUVIA) 100 MG tablet Take 100 mg by mouth every morning.   . temazepam (RESTORIL) 30 MG capsule Take 1 capsule by mouth at bedtime as needed for sleep.   . [DISCONTINUED] BRILINTA 90 MG TABS tablet TAKE 1 TABLET BY MOUTH TWICE DAILY  . [DISCONTINUED] metoprolol tartrate (LOPRESSOR) 25 MG tablet TAKE 1/2 TABLET BY MOUTH TWICE DAILY  . [DISCONTINUED] metoprolol tartrate (LOPRESSOR) 25 MG tablet Take 1 tablet (25 mg total) by mouth 2 (two) times daily.    Allergies  Allergen Reactions  . Iohexol Hives and Other (See Comments)    Excessive sweating    Social History   Social History  . Marital status: Single    Spouse name: N/A  . Number of children: N/A  . Years of  education: N/A   Social History Main Topics  . Smoking status: Never Smoker  . Smokeless tobacco: Never Used  . Alcohol use No  . Drug use: No  . Sexual activity: Not Asked   Other Topics Concern  . None   Social History Narrative   She is married with 4 children.   She does not smoke and does not drink alcohol.    family history includes Asthma in her mother; Cancer in her father; Heart attack in her mother; Heart disease in her brother, mother, and sister.  Wt Readings from Last 3 Encounters:  10/24/16 232 lb 12.8 oz (105.6 kg)  10/25/15 202 lb (91.6 kg)  12/11/14 218 lb (98.9 kg)    PHYSICAL EXAM BP (!) 150/84 (BP Location: Right Arm, Patient Position: Sitting, Cuff Size: Large)   Pulse 72   Ht 5' 4"  (1.626 m)   Wt 232 lb 12.8 oz (105.6 kg)   BMI 39.96 kg/m  General appearance: alert, cooperative, appears stated age, no distress. Moderate-Morbidly obese; well groomed. HEENT: /AT, EOMI, MMM, anicteric sclera  Neck: no adenopathy,  no JVD; soft right-sided carotid bruit versus radiated murmur. Lungs: clear to auscultation bilaterally, normal percussion bilaterally and non-labored; no W/R/R. Heart: regular rate and rhythm, S1 &S2 normal, no murmur, click, rub or gallop; 2/6 early peaking c-d SEM @RUSB  Abdomen: soft, non-tender; bowel sounds normal; no masses,  no organomegaly; unable to palpate PMI Extremities: extremities normal, atraumatic, no cyanosis, and edema - trace Pulses: 2+ and symmetric;  Skin: mobility and turgor normal, no evidence of bleeding or bruising and no lesions noted Neurologic: Mental status: Alert & oriented x 3, thought content appropriate; non-focal exam.  Pleasant mood & affect.    Adult ECG Report  Rate: 72 ;  Rhythm: normal sinus rhythm and normal axis,intervals & durations;   Narrative Interpretation: normal   Other studies Reviewed: Additional studies/ records that were reviewed today include:  Recent Labs:  - per PCP   ASSESSMENT  / PLAN: Problem List Items Addressed This Visit    CAD S/P percutaneous coronary angioplasty -- LAD/OM1 DES 02/17/13 - Primary (Chronic)    Stable, no active anginal symptoms. Remains on Brilinta at 90 mg daily without aspirin. Is on amlodipine, Imdur, and metoprolol tartrate along with statin Plan: Increase metoprolol to 25 mg twice a day; reduce Brilinta to 60 mg twice a day      Relevant Orders   EKG 12-Lead (Completed)   Essential hypertension (Chronic)    Not is well-controlled today as it usually has been, she did indicate that she has yet to take her blood pressure medication today which could explain some of it. Regardless, I will increase metoprolol 25 mg twice a day. Next would be to increase amlodipine to 51m      Hyperlipidemia with target LDL less than 70 (Chronic)    Remains on high-dose statin would labs monitored by PCP. Unfortunately not currently available.      NSTEMI (non-ST elevated myocardial infarction) (HCC) (Chronic)    No recurrent anginal symptoms. No heart failure symptoms. No regional wall motion on LV gram. Needs to get back to exercising. On stable regimen.      Obesity (BMI 30-39.9) (Chronic)    She had done very well with her diet And had been doing exercise, but unfortunately she fell off band wagon lead she heard me. She injured her back into her exercise regimen and I confirmed with her that she will go back to the diet she was on.      Orthostatic hypotension (Chronic)    Doesn't seem that she's had any further hypotensive episodes. She should be fine titrating up beta blocker. I will be gentle with titrating.         Current medicines are reviewed at length with the patient today. (+/- concerns) n/a The following changes have been made: n/a  Patient Instructions  Your physician has recommended you make the following change in your medication:  -- INCREASE metoprolol to 1 tablet twice daily -- START brilinta 631mtwice daily   Your  physician wants you to follow-up in: ONE YEAR with Dr. HaEllyn HackYou will receive a reminder letter in the mail two months in advance. If you don't receive a letter, please call our office to schedule the follow-up appointment.    Studies Ordered:   Orders Placed This Encounter  Procedures  . EKG 12-Lead      DaGlenetta HewM.D., M.S. Interventional Cardiologist   Pager # [phone] # [phone] [address] Finch [address]SuWashburnrCaney Ridge[place] [id number]

## 2016-10-24 NOTE — Telephone Encounter (Signed)
Pt called stating that her medications was sent to the wrong pharmacy and requested that her medications be resent to the correct pharmacy. Rx was resent. Confirmation received.

## 2016-10-24 NOTE — Patient Instructions (Addendum)
Your physician has recommended you make the following change in your medication:  -- INCREASE metoprolol to 1 tablet twice daily -- START brilinta 60mg  twice daily   Your physician wants you to follow-up in: ONE YEAR with Dr. Ellyn Hack. You will receive a reminder letter in the mail two months in advance. If you don't receive a letter, please call our office to schedule the follow-up appointment.

## 2016-10-26 ENCOUNTER — Encounter: Payer: Self-pay | Admitting: Cardiology

## 2016-10-26 NOTE — Assessment & Plan Note (Signed)
No recurrent anginal symptoms. No heart failure symptoms. No regional wall motion on LV gram. Needs to get back to exercising. On stable regimen.

## 2016-10-26 NOTE — Assessment & Plan Note (Signed)
Doesn't seem that she's had any further hypotensive episodes. She should be fine titrating up beta blocker. I will be gentle with titrating.

## 2016-10-26 NOTE — Assessment & Plan Note (Signed)
Stable, no active anginal symptoms. Remains on Brilinta at 90 mg daily without aspirin. Is on amlodipine, Imdur, and metoprolol tartrate along with statin Plan: Increase metoprolol to 25 mg twice a day; reduce Brilinta to 60 mg twice a day

## 2016-10-26 NOTE — Assessment & Plan Note (Signed)
She had done very well with her diet And had been doing exercise, but unfortunately she fell off band wagon lead she heard me. She injured her back into her exercise regimen and I confirmed with her that she will go back to the diet she was on.

## 2016-10-26 NOTE — Assessment & Plan Note (Signed)
Remains on high-dose statin would labs monitored by PCP. Unfortunately not currently available.

## 2016-10-26 NOTE — Assessment & Plan Note (Signed)
Not is well-controlled today as it usually has been, she did indicate that she has yet to take her blood pressure medication today which could explain some of it. Regardless, I will increase metoprolol 25 mg twice a day. Next would be to increase amlodipine to 10mg 

## 2016-11-03 DIAGNOSIS — I10 Essential (primary) hypertension: Secondary | ICD-10-CM | POA: Diagnosis not present

## 2016-11-03 DIAGNOSIS — I259 Chronic ischemic heart disease, unspecified: Secondary | ICD-10-CM | POA: Diagnosis not present

## 2016-11-03 DIAGNOSIS — E1169 Type 2 diabetes mellitus with other specified complication: Secondary | ICD-10-CM | POA: Diagnosis not present

## 2016-12-14 ENCOUNTER — Emergency Department (HOSPITAL_COMMUNITY)
Admission: EM | Admit: 2016-12-14 | Discharge: 2016-12-14 | Disposition: A | Discharge status: Home / Self Care | Payer: Medicare Other | Attending: Emergency Medicine | Admitting: Emergency Medicine

## 2016-12-14 ENCOUNTER — Encounter (HOSPITAL_COMMUNITY): Payer: Self-pay | Admitting: Emergency Medicine

## 2016-12-14 ENCOUNTER — Emergency Department (HOSPITAL_COMMUNITY): Payer: Medicare Other

## 2016-12-14 DIAGNOSIS — M1612 Unilateral primary osteoarthritis, left hip: Secondary | ICD-10-CM | POA: Insufficient documentation

## 2016-12-14 DIAGNOSIS — E1169 Type 2 diabetes mellitus with other specified complication: Secondary | ICD-10-CM | POA: Insufficient documentation

## 2016-12-14 DIAGNOSIS — I252 Old myocardial infarction: Secondary | ICD-10-CM | POA: Diagnosis not present

## 2016-12-14 DIAGNOSIS — E669 Obesity, unspecified: Secondary | ICD-10-CM | POA: Diagnosis not present

## 2016-12-14 DIAGNOSIS — I2510 Atherosclerotic heart disease of native coronary artery without angina pectoris: Secondary | ICD-10-CM | POA: Insufficient documentation

## 2016-12-14 DIAGNOSIS — Z79899 Other long term (current) drug therapy: Secondary | ICD-10-CM | POA: Diagnosis not present

## 2016-12-14 DIAGNOSIS — I10 Essential (primary) hypertension: Secondary | ICD-10-CM | POA: Insufficient documentation

## 2016-12-14 DIAGNOSIS — Z9641 Presence of insulin pump (external) (internal): Secondary | ICD-10-CM | POA: Diagnosis not present

## 2016-12-14 DIAGNOSIS — Z794 Long term (current) use of insulin: Secondary | ICD-10-CM | POA: Insufficient documentation

## 2016-12-14 DIAGNOSIS — M25552 Pain in left hip: Secondary | ICD-10-CM | POA: Diagnosis not present

## 2016-12-14 NOTE — ED Triage Notes (Signed)
Pt reports left hip pain x2 months. Pt denies any known injury.

## 2016-12-14 NOTE — ED Provider Notes (Signed)
  Face-to-face evaluation   History: She presents for evaluation of gradual onset of left hip pain.  Pain occurs at varying times, sometimes spontaneously.  No known trauma.  Physical exam: Obese, alert, cooperative.  Left hip with marked pain on internal rotation.  No pain on external rotation.  Able to flex hip without pain.  Medical screening examination/treatment/procedure(s) were conducted as a shared visit with non-physician practitioner(s) and myself.  I personally evaluated the patient during the encounter   Daleen Bo, MD 12/15/16 1042

## 2016-12-14 NOTE — ED Provider Notes (Signed)
[facility] DEPT Provider Note   CSN: [id number] Arrival date & time: 12/14/16  1819     History   Chief Complaint Chief Complaint  Patient presents with  . Hip Pain    HPI Phyllis Conner is a 71 y.o. female w PMHx T2DM, HTN, CAD, HLD, presenting to the ED with gradual onset of persistent Left hip pain 2 months. Patient states pain is located anteriorly and lateral to the left hip, made worse with weightbearing and movement. She states she takes Tylenol which provides her with relief of symptoms. She states this feels similar to the arthritis she has in her bilateral knees. Denies any injury, or back pain. No numbness or tingling. No other complaints today.  The history is provided by the patient.    Past Medical History:  Diagnosis Date  . CAD S/P percutaneous coronary angioplasty November 2014   Mid LAD 2.5 mm x 12 mm Promus DES (2.70 mm); proximal OM1 - Promus DES 2.75 mm x 16 mm (2.85 mm)  . Diabetes mellitus type 2 in obese ([place])   . Essential hypertension   . Hyperlipidemia with target LDL less than 70   . Non-Q wave ST elevation myocardial infarction (STEMI) involving left anterior descending (LAD) coronary artery November 2014   PCI to LAD and circumflex  . Obesity (BMI 30-39.9) 05/05/2013    Patient Active Problem List   Diagnosis Date Noted  . Right carotid bruit 10/25/2013  . Diabetes mellitus type 2 in obese ([place])   . Hyperlipidemia with target LDL less than 70   . Orthostatic hypotension 08/28/2013  . Renal insufficiency 08/19/2013  . Obesity (BMI 30-39.9) 05/05/2013  . CAD S/P percutaneous coronary angioplasty -- LAD/OM1 DES 02/17/13 02/18/2013  . Pulmonary embolism August 2011 (syncope) 02/18/2013  . NSTEMI (non-ST elevated myocardial infarction) ([place]) 02/15/2013  . Essential hypertension 02/15/2013    Past Surgical History:  Procedure Laterality Date  . ABDOMINAL HYSTERECTOMY  1990s  . Arthroscopic knee surgery Bilateral 1984, 2002   Arthroscopic    . CARDIAC CATHETERIZATION  November 2014   Mid LAD 95%, proximal OM1 80-90% --> two-vessel PCI  . COLONOSCOPY N/A 12/05/2013   Procedure: COLONOSCOPY;  Surgeon: Beryle Beams, MD;  Location: [facility];  Service: Endoscopy;  Laterality: N/A;  . COLONOSCOPY WITH PROPOFOL N/A 12/11/2014   Procedure: COLONOSCOPY WITH PROPOFOL;  Surgeon: Carol Ada, MD;  Location: [facility];  Service: Endoscopy;  Laterality: N/A;  . ESOPHAGOGASTRODUODENOSCOPY N/A 12/05/2013   Procedure: ESOPHAGOGASTRODUODENOSCOPY (EGD);  Surgeon: Beryle Beams, MD;  Location: [facility];  Service: Endoscopy;  Laterality: N/A;  . LEFT HEART CATHETERIZATION WITH CORONARY ANGIOGRAM N/A 02/17/2013   Procedure: LEFT HEART CATHETERIZATION WITH CORONARY ANGIOGRAM;  Surgeon: Sanda Klein, MD;  Location: [facility] CATH LAB;  Service: Cardiovascular;  Laterality: N/A;  . PERCUTANEOUS CORONARY STENT INTERVENTION (PCI-S)  November 2014   Mid LAD 2.5 mm x 12 mm (2.75 mm) Promus DES; OM1 2.75 mm x 60 mm (2.85 mm) Promus DES  . TRANSTHORACIC ECHOCARDIOGRAM  02/16/2013   EF 40-50%. Moderate concentric LVH. Moderate H. Shea of mid-distal septal and apical region. Grade 1 diastolic dysfunction. Moderate aortic sclerosis without stenosis.    OB History    No data available       Home Medications    Prior to Admission medications   Medication Sig Start Date End Date Taking? Authorizing Provider  ACCU-CHEK AVIVA PLUS test strip 1 each by Other route 2 (two) times daily.  01/08/14  [provider]  amLODipine (NORVASC) 5 MG tablet TAKE 1 TABLET BY MOUTH DAILY 01/10/16   Leonie Man, MD  atorvastatin (LIPITOR) 80 MG tablet TAKE 1 TABLET BY MOUTH DAILY AT 6 PM 01/10/16   Leonie Man, MD  HUMALOG 100 UNIT/ML injection 1 application daily. 10/01/15   [provider]  Insulin Disposable Pump (V-GO 20) KIT Inject 10 Units into the skin every morning. Humalog 01/14/14   [provider]  isosorbide mononitrate  (IMDUR) 30 MG 24 hr tablet TAKE 1 TABLET BY MOUTH EVERY DAY 04/07/16   Leonie Man, MD  metFORMIN (GLUCOPHAGE) 500 MG tablet Take 1 tablet (500 mg total) by mouth daily. 02/20/13   Erlene Quan, PA-C  metoprolol tartrate (LOPRESSOR) 25 MG tablet Take 1 tablet (25 mg total) by mouth 2 (two) times daily. 10/24/16   Leonie Man, MD  nitroGLYCERIN (NITROSTAT) 0.4 MG SL tablet Place 1 tablet (0.4 mg total) under the tongue every 5 (five) minutes x 3 doses as needed for chest pain. 02/18/13   Erlene Quan, PA-C  potassium chloride SA (K-DUR,KLOR-CON) 20 MEQ tablet Take 1 tablet by mouth every morning.  09/14/14   [provider]  sitaGLIPtin (JANUVIA) 100 MG tablet Take 100 mg by mouth every morning.     [provider]  temazepam (RESTORIL) 30 MG capsule Take 1 capsule by mouth at bedtime as needed for sleep.  08/24/14   [provider]  ticagrelor (BRILINTA) 60 MG TABS tablet Take 1 tablet (60 mg total) by mouth 2 (two) times daily. 10/24/16   Leonie Man, MD    Family History Family History  Problem Relation Age of Onset  . Cancer Father        prostate cancer  . Heart disease Mother   . Heart attack Mother   . Asthma Mother   . Heart disease Sister   . Heart disease Brother     Social History Social History  Substance Use Topics  . Smoking status: Never Smoker  . Smokeless tobacco: Never Used  . Alcohol use No     Allergies   Iohexol   Review of Systems Review of Systems  Constitutional: Negative for fever.  Gastrointestinal: Negative for abdominal pain.  Genitourinary: Negative for dysuria and frequency.  Musculoskeletal: Positive for arthralgias. Negative for back pain.  Neurological: Negative for numbness.     Physical Exam Updated Vital Signs BP (!) 173/82 (BP Location: Right Arm)   Pulse 69   Temp 98.5 F (36.9 C) (Oral)   Resp 18   Ht 5' 3"  (1.6 m)   Wt 105.2 kg (232 lb)   SpO2 98%   BMI 41.10 kg/m   Physical Exam    Constitutional: She appears well-developed and well-nourished.  Morbidly obese. Not in distress.  HENT:  Head: Normocephalic and atraumatic.  Eyes: Conjunctivae are normal.  Cardiovascular: Normal rate and intact distal pulses.   Pulmonary/Chest: Effort normal.  Musculoskeletal:  Left hip with normal ROM, however with pain. TTP over lateral left hip as well as anteriorly near groin. No gross deformities or edema. Gait with limp favoring left. No spinal or paraspinal tenderness  Neurological:  5/5 strength b/l lower extremities. Normal sensation distal extremities.  Psychiatric: She has a normal mood and affect. Her behavior is normal.  Nursing note and vitals reviewed.    ED Treatments / Results  Labs (all labs ordered are listed, but only abnormal results are displayed) Labs Reviewed -  No data to display  EKG  EKG Interpretation None       Radiology Dg Hip Unilat With Pelvis 2-3 Views Left  Result Date: 12/14/2016 CLINICAL DATA:  Left hip pain worsening over the last 2 months. History of arthritis. EXAM: DG HIP (WITH OR WITHOUT PELVIS) 2-3V LEFT COMPARISON:  None. FINDINGS: Severe craniocaudad and axial loss of articular space in the left hip with associated spurring, subcortical sclerosis, and mild subcortical lucency secondary to arthropathy. There is only minimal articular space narrowing and spurring of the right fifth. No additional significant findings. IMPRESSION: 1. Severe asymmetric osteoarthritis of the left hip. Electronically Signed   By: Van Clines M.D.   On: 12/14/2016 18:57    Procedures Procedures (including critical care time)  Medications Ordered in ED Medications - No data to display   Initial Impression / Assessment and Plan / ED Course  I have reviewed the triage vital signs and the nursing notes.  Pertinent labs & imaging results that were available during my care of the patient were reviewed by me and considered in my medical decision  making (see chart for details).     Pt presenting with 2 months of left hip pain, without injury. X-ray showing severe osteoarthritis of the left hip. NV intact. Pt can walk, but states it is painful. Orthopedic referral given. Symptomatic management discussed. Pt is safe for discharge.   Patient discussed with and seen by Dr. Eulis Foster.  Discussed results, findings, treatment and follow up. Patient advised of return precautions. Patient verbalized understanding and agreed with plan.  Final Clinical Impressions(s) / ED Diagnoses   Final diagnoses:  Osteoarthritis of left hip, unspecified osteoarthritis type    New Prescriptions New Prescriptions   No medications on file     [place], [place] N, PA-C 12/14/16 2020    Daleen Bo, MD 12/15/16 1042

## 2016-12-14 NOTE — Discharge Instructions (Signed)
Please read instructions below. You can take tylenol every 6 hours as needed for pain. Schedule an appointment with the orthopedic specialist in 1 week for follow-up on your x-ray findings today of osteoarthritis of the left hip.  Return to the ER for new or concerning symptoms.

## 2016-12-25 ENCOUNTER — Other Ambulatory Visit: Payer: Self-pay | Admitting: Orthopedic Surgery

## 2016-12-25 ENCOUNTER — Encounter: Payer: Self-pay | Admitting: Orthopedic Surgery

## 2016-12-25 ENCOUNTER — Ambulatory Visit (INDEPENDENT_AMBULATORY_CARE_PROVIDER_SITE_OTHER): Payer: Medicare Other | Admitting: Orthopedic Surgery

## 2016-12-25 VITALS — BP 154/91 | HR 63 | Ht 63.0 in | Wt 232.0 lb

## 2016-12-25 DIAGNOSIS — M1610 Unilateral primary osteoarthritis, unspecified hip: Secondary | ICD-10-CM | POA: Diagnosis not present

## 2016-12-25 MED ORDER — MELOXICAM 7.5 MG PO TABS: 7.5000 mg | ORAL_TABLET | Freq: Every day | ORAL | 1 refills | Status: DC

## 2016-12-25 NOTE — Patient Instructions (Addendum)
Total Hip Replacement Total hip replacement is a surgery to replace your damaged hip joint. Your hip joint is replaced with a man-made (artificial) hip joint. This man-made hip joint is called a prosthesis. This surgery is done to lessen pain and improve movement. What happens before the procedure?  Do not eat or drink anything after midnight on the night before the procedure or as told by your doctor.  Ask your doctor about: ? Changing or stopping your normal medicines. This is important if you take diabetes medicines or blood thinners. ? Taking aspirin or ibuprofen medicines. These thin your blood. Do not take these medicines if your doctor tells you not to.  Plan to have someone take you home after the procedure.  Ask your health care team how your surgery site will be marked.  You may be given medicines that kill germs (antibiotics) to help prevent infection. What happens during the procedure?  To help prevent infection: ? Your health care team will wash or sanitize their hands. ? Your skin will be washed with soap.  An IV tube will be put into one of your veins.  You will be given one or more of the following: ? A medicine that makes you relaxed (sedative). ? A medicine that makes you fall asleep (general anesthetic). ? A medicine that numbs your body below the waist (spinal anesthetic).  A cut (incision) will be made in your hip. Your surgeon will take out any damaged parts of your hip joint.  Your surgeon will then: ? Put a man-made hip joint into your pelvic bone. Screws may be used to keep the hip joint in place. ? Take out the damaged ball of your thigh bone (femur). A man-made ball on a metal pole will replace the damaged ball. ? The ball will be put into the new socket to make a new hip joint. Your hip joint will be checked to see if it moves as it should. ? Close the cut and place a bandage over it. What happens after the procedure?  You will stay in a recovery area  until your medicines wear off.  Your nurse will monitor your vital signs. These include: ? Your pulse. ? Your blood pressure.  Once you are doing okay, you will be taken to your hospital room.  You may be told to take actions to help prevent blood clots. These may include: ? Walking soon after surgery with someone helping you. Moving around helps to improve blood flow. ? Taking medicines to thin your blood (anticoagulants). ? Wearing special socks (compression stockings) or using other types of devices.  You will do exercise therapy (physical therapy) until you are doing well. Your doctor will tell you when you are well enough to go home. This information is not intended to replace advice given to you by your health care provider. Make sure you discuss any questions you have with your health care provider. Document Released: 06/19/2011 Document Revised: 11/29/2015 Document Reviewed: 05/28/2013 Elsevier Interactive Patient Education  2018 Elsevier Inc.  

## 2016-12-25 NOTE — Progress Notes (Signed)
Patient ID: Phyllis Conner, female   DOB: 06-06-1945, 71 y.o.   MRN: [id number]  Chief Complaint  Patient presents with  . Hip Pain    left side    HPI Phyllis Conner is a 71 y.o. female.  New patient to this practice. She 71 years old she has left groin pain 1 month which is worsening. She has had no treatment. She denies any trauma. It seems to be worse when she is walking. It is a dull ache  Review of Systems Review of Systems  Respiratory: Negative for shortness of breath.   Cardiovascular: Negative for chest pain.   (2 MINIMUM)  Past Medical History:  Diagnosis Date  . CAD S/P percutaneous coronary angioplasty November 2014   Mid LAD 2.5 mm x 12 mm Promus DES (2.70 mm); proximal OM1 - Promus DES 2.75 mm x 16 mm (2.85 mm)  . Diabetes mellitus type 2 in obese ([place])   . Essential hypertension   . Hyperlipidemia with target LDL less than 70   . Non-Q wave ST elevation myocardial infarction (STEMI) involving left anterior descending (LAD) coronary artery November 2014   PCI to LAD and circumflex  . Obesity (BMI 30-39.9) 05/05/2013    Past Surgical History:  Procedure Laterality Date  . ABDOMINAL HYSTERECTOMY  1990s  . Arthroscopic knee surgery Bilateral 1984, 2002   Arthroscopic  . CARDIAC CATHETERIZATION  November 2014   Mid LAD 95%, proximal OM1 80-90% --> two-vessel PCI  . COLONOSCOPY N/A 12/05/2013   Procedure: COLONOSCOPY;  Surgeon: Beryle Beams, MD;  Location: [facility];  Service: Endoscopy;  Laterality: N/A;  . COLONOSCOPY WITH PROPOFOL N/A 12/11/2014   Procedure: COLONOSCOPY WITH PROPOFOL;  Surgeon: Carol Ada, MD;  Location: [facility];  Service: Endoscopy;  Laterality: N/A;  . ESOPHAGOGASTRODUODENOSCOPY N/A 12/05/2013   Procedure: ESOPHAGOGASTRODUODENOSCOPY (EGD);  Surgeon: Beryle Beams, MD;  Location: [facility];  Service: Endoscopy;  Laterality: N/A;  . LEFT HEART CATHETERIZATION WITH CORONARY ANGIOGRAM N/A 02/17/2013   Procedure: LEFT HEART  CATHETERIZATION WITH CORONARY ANGIOGRAM;  Surgeon: Sanda Klein, MD;  Location: [facility];  Service: Cardiovascular;  Laterality: N/A;  . PERCUTANEOUS CORONARY STENT INTERVENTION (PCI-S)  November 2014   Mid LAD 2.5 mm x 12 mm (2.75 mm) Promus DES; OM1 2.75 mm x 60 mm (2.85 mm) Promus DES  . TRANSTHORACIC ECHOCARDIOGRAM  02/16/2013   EF 40-50%. Moderate concentric LVH. Moderate H. Shea of mid-distal septal and apical region. Grade 1 diastolic dysfunction. Moderate aortic sclerosis without stenosis.    Social History Social History  Substance Use Topics  . Smoking status: Never Smoker  . Smokeless tobacco: Never Used  . Alcohol use No    Allergies  Allergen Reactions  . Iohexol Hives and Other (See Comments)    Excessive sweating    Current Meds  Medication Sig  . ACCU-CHEK AVIVA PLUS test strip 1 each by Other route 2 (two) times daily.   Marland Kitchen amLODipine (NORVASC) 5 MG tablet TAKE 1 TABLET BY MOUTH DAILY  . atorvastatin (LIPITOR) 80 MG tablet TAKE 1 TABLET BY MOUTH DAILY AT 6 PM  . HUMALOG 100 UNIT/ML injection 1 application daily.  . Insulin Disposable Pump (V-GO 20) KIT Inject 10 Units into the skin every morning. Humalog  . isosorbide mononitrate (IMDUR) 30 MG 24 hr tablet TAKE 1 TABLET BY MOUTH EVERY DAY  . metFORMIN (GLUCOPHAGE) 500 MG tablet Take 1 tablet (500 mg total) by mouth daily.  . metoprolol tartrate (LOPRESSOR) 25  MG tablet Take 1 tablet (25 mg total) by mouth 2 (two) times daily.  . nitroGLYCERIN (NITROSTAT) 0.4 MG SL tablet Place 1 tablet (0.4 mg total) under the tongue every 5 (five) minutes x 3 doses as needed for chest pain.  . potassium chloride SA (K-DUR,KLOR-CON) 20 MEQ tablet Take 1 tablet by mouth every morning.   . sitaGLIPtin (JANUVIA) 100 MG tablet Take 100 mg by mouth every morning.   . temazepam (RESTORIL) 30 MG capsule Take 1 capsule by mouth at bedtime as needed for sleep.   . ticagrelor (BRILINTA) 60 MG TABS tablet Take 1 tablet (60 mg total) by  mouth 2 (two) times daily.      Physical Exam Physical Exam 1.BP (!) 154/91   Pulse 63   Ht 5' 3"  (1.6 m)   Wt 232 lb (105.2 kg)   BMI 41.10 kg/m   2. Gen. appearance. The patient is well-developed and well-nourished, grooming and hygiene are normal. There are no gross congenital abnormalities  3. The patient is alert and oriented to person place and time  4. Mood and affect are normal  5. Ambulation Antalgic gait with left groin pain Examination reveals the following: 6. On inspection we find decreased hip flexion on the right and left painful on the left nontender and nonpainful on the right, no palpable tenderness in the left right hip  7. With the range of motion of  90 flexion on the left and right normal abduction bilateral right and left  8. Stability tests were normal  right and left hip  9. Strength tests revealed grade 5 motor strength right and left leg  10. Skin we find no rash ulceration or erythema right and left lower extremity  11. Sensation remains intact right and left lower extremity  12 Vascular system shows no peripheral edema right and left leg   MEDICAL DECISION MAKING:    Data Reviewed Plain films show degenerative arthritis left hip with my interpretation of in stage disease bone-on-bone changes cyst formation and osteophyte formation  Plain film report FINDINGS: Severe craniocaudad and axial loss of articular space in the left hip with associated spurring, subcortical sclerosis, and mild subcortical lucency secondary to arthropathy. There is only minimal articular space narrowing and spurring of the right fifth.   No additional significant findings.   IMPRESSION: 1. Severe asymmetric osteoarthritis of the left hip.     Electronically Signed   By: Van Clines M.D.   On: 12/14/2016 18:57   Assessment 71 year old female with end-stage osteoarthritis left hip. No prior treatment. She is obese with a BMI greater than  40.  Plan Recommend weight loss to 220 pounds, mobile for pain in physical therapy. Return when weight is appropriate for surgery.  Phyllis Conner 12/25/2016, 10:23 AM

## 2017-01-10 ENCOUNTER — Other Ambulatory Visit: Payer: Self-pay | Admitting: Cardiology

## 2017-01-16 ENCOUNTER — Ambulatory Visit (HOSPITAL_COMMUNITY): Payer: Medicare Other | Attending: Orthopedic Surgery

## 2017-01-16 ENCOUNTER — Encounter (HOSPITAL_COMMUNITY): Payer: Self-pay

## 2017-01-16 DIAGNOSIS — R269 Unspecified abnormalities of gait and mobility: Secondary | ICD-10-CM | POA: Insufficient documentation

## 2017-01-16 DIAGNOSIS — M1612 Unilateral primary osteoarthritis, left hip: Secondary | ICD-10-CM | POA: Diagnosis not present

## 2017-01-16 DIAGNOSIS — M6281 Muscle weakness (generalized): Secondary | ICD-10-CM | POA: Diagnosis not present

## 2017-01-16 NOTE — Therapy (Signed)
[place], [place], [address] Phone: [phone]   Fax:  [phone]  Physical Therapy Evaluation  Patient Details  Name: Phyllis Conner MRN: [id number] Date of Birth: 1945/07/25 Referring Provider: Arther Abbott MD  Encounter Date: 01/16/2017      PT End of Session - 01/16/17 1831    Visit Number 1   Number of Visits 9   Date for PT Re-Evaluation 02/06/17   Authorization Type [employer] Medicare   Authorization - Visit Number 1   Authorization - Number of Visits 10   PT Start Time 0932   PT Stop Time 1728   PT Time Calculation (min) 43 min   Activity Tolerance Patient tolerated treatment well   Behavior During Therapy [facility] for tasks assessed/performed      Past Medical History:  Diagnosis Date  . CAD S/P percutaneous coronary angioplasty November 2014   Mid LAD 2.5 mm x 12 mm Promus DES (2.70 mm); proximal OM1 - Promus DES 2.75 mm x 16 mm (2.85 mm)  . Diabetes mellitus type 2 in obese ([place])   . Essential hypertension   . Hyperlipidemia with target LDL less than 70   . Non-Q wave ST elevation myocardial infarction (STEMI) involving left anterior descending (LAD) coronary artery November 2014   PCI to LAD and circumflex  . Obesity (BMI 30-39.9) 05/05/2013    Past Surgical History:  Procedure Laterality Date  . ABDOMINAL HYSTERECTOMY  1990s  . Arthroscopic knee surgery Bilateral 1984, 2002   Arthroscopic  . CARDIAC CATHETERIZATION  November 2014   Mid LAD 95%, proximal OM1 80-90% --> two-vessel PCI  . COLONOSCOPY N/A 12/05/2013   Procedure: COLONOSCOPY;  Surgeon: Beryle Beams, MD;  Location: [facility];  Service: Endoscopy;  Laterality: N/A;  . COLONOSCOPY WITH PROPOFOL N/A 12/11/2014   Procedure: COLONOSCOPY WITH PROPOFOL;  Surgeon: Carol Ada, MD;  Location: [facility];  Service: Endoscopy;  Laterality: N/A;  . ESOPHAGOGASTRODUODENOSCOPY N/A 12/05/2013   Procedure: ESOPHAGOGASTRODUODENOSCOPY (EGD);   Surgeon: Beryle Beams, MD;  Location: [facility];  Service: Endoscopy;  Laterality: N/A;  . LEFT HEART CATHETERIZATION WITH CORONARY ANGIOGRAM N/A 02/17/2013   Procedure: LEFT HEART CATHETERIZATION WITH CORONARY ANGIOGRAM;  Surgeon: Sanda Klein, MD;  Location: [facility]ATH [facility];  Service: Cardiovascular;  Laterality: N/A;  . PERCUTANEOUS CORONARY STENT INTERVENTION (PCI-S)  November 2014   Mid LAD 2.5 mm x 12 mm (2.75 mm) Promus DES; OM1 2.75 mm x 60 mm (2.85 mm) Promus DES  . TRANSTHORACIC ECHOCARDIOGRAM  02/16/2013   EF 40-50%. Moderate concentric LVH. Moderate H. Shea of mid-distal septal and apical region. Grade 1 diastolic dysfunction. Moderate aortic sclerosis without stenosis.    There were no vitals filed for this visit.       Subjective Assessment - 01/16/17 1650    Subjective Patient reports that she was having problems about 3 months with her hip and she was taking tylenol to help with pain. She was informed at the ER that it was arthritis in her Lt. hip. She denies any particular day that her arthritis started to irritate her but that it slowly progressed when she would try to stand up and walk.    Pertinent History Previous Lt. knee 1980s, unremarkeable otherwise    Limitations Walking;Sitting;Standing;House hold activities   How long can you sit comfortably? 15-20 minutes   How long can you stand comfortably? 30 minutes   How long can you walk comfortably? not limited, starts to  limp after about 30 minutes   Diagnostic tests xray presented with arthritis    Patient Stated Goals To improve pain so she can get back to work and be more active    Currently in Pain? Yes   Pain Score 7    Pain Location Hip   Pain Orientation Left   Pain Descriptors / Indicators Constant;Aching   Pain Type Chronic pain   Pain Onset More than a month ago   Pain Frequency Constant   Aggravating Factors  Decreased activity, sitting then getting up to walk    Pain Relieving Factors walking helps  to decrease pain   Effect of Pain on Daily Activities Yes, specifically on rainy days she will not be able to tolerate much activity             [facility] PT Assessment - 01/16/17 0001      Assessment   Medical Diagnosis Lt. hip arthritis    Referring Provider Arther Abbott MD   Onset Date/Surgical Date 10/16/16   Hand Dominance Left   Prior Therapy none     Precautions   Precautions None     Restrictions   Weight Bearing Restrictions No     Balance Screen   Has the patient fallen in the past 6 months No   Has the patient had a decrease in activity level because of a fear of falling?  No   Is the patient reluctant to leave their home because of a fear of falling?  No     Home Environment   Living Environment Private residence     Prior Function   Level of Independence Independent   Vocation Requirements wants to get back to work     Observation/Other Assessments   Focus on Therapeutic Outcomes (FOTO)  70% limitation     Single Leg Stance   Comments 3 sec Rt. 8 sec Lt.      Posture/Postural Control   Posture Comments Rounded shoulders     AROM   Right Hip Flexion 110   Left Hip Flexion 110   Left Hip External Rotation  40   Left Hip Internal Rotation  40   Left Hip ABduction 40     Strength   Right Hip Flexion 4+/5   Right Hip ABduction 3-/5   Left Hip Flexion 4/5   Left Hip ABduction 3-/5   Right Knee Flexion 5/5   Right Knee Extension 5/5   Left Knee Flexion 4+/5   Left Knee Extension 4/5   Right Ankle Dorsiflexion 5/5   Right Ankle Plantar Flexion 5/5   Left Ankle Dorsiflexion 5/5   Left Ankle Plantar Flexion 5/5     Flexibility   Hamstrings WNL     Transfers   Five time sit to stand comments  17 secs UE assist   Comments TUG 12 sec UE assist out of chair     Ambulation/Gait   Ambulation Distance (Feet) 582 Feet  3MWT   Gait Pattern Lateral hip instability;Decreased step length - left;Trunk rotated posteriorly on left             Objective measurements completed on examination: See above findings.          [facility] Adult PT Treatment/Exercise - 01/16/17 0001      Knee/Hip Exercises: Standing   Hip Flexion Left;10 reps   Hip Flexion Limitations 3s hold   Hip Abduction Left;10 reps   Abduction Limitations 3s hold   Hip Extension Left;10 reps  Extension Limitations 3s hold     Knee/Hip Exercises: Supine   Bridges 10 reps                  PT Short Term Goals - 01/16/17 1835      PT SHORT TERM GOAL #1   Title Patient will be compliant with HEP to improve functional mobility and verbal pain ratings.    Time 2   Period Weeks   Status New     PT SHORT TERM GOAL #2   Title Patient will have improved single leg balance to at least 15 seconds each leg to improve safety during ambulation and gait mechanics.    Time 2   Period Weeks   Status New           PT Long Term Goals - 01/16/17 1836      PT LONG TERM GOAL #1   Title Patient will have improved functional mobility noted by improved 3MWT to 678 ft at least with improved gait mechanics.    Time 4   Period Weeks   Status New     PT LONG TERM GOAL #2   Title Patient will have improved hip strength by 1 MMT grade to improve stance support during ambulation and to decrease pain.    Time 4   Period Weeks   Status New     PT LONG TERM GOAL #3   Title Patient will have improved quality of life rating noted by improve FOTO score by 15%.    Time 4   Period Weeks   Status New                Plan - 01/16/17 1846    Clinical Impression Statement Patient is a pleasant 71 year old woman presenting today with complaints of left hip pain secondary to xray diagnosis of arthritis. Phyllis Conner presents with limitations of endurance, strength and stability leading to increased pain with functional daily tasks. She reports most of her pain starts after she sits to rest but with movement her pain starts to decrease. She'll benefit  from skilled PT to address the above impairments and to improve her pain so she can meet her goal of starting a job, being more active and losing weight.    Clinical Presentation Stable   Clinical Presentation due to: (-)Chronic pain, overweight; (+) motivated, good attitude    Clinical Decision Making Low   Rehab Potential Good   PT Frequency 2x / week   PT Duration 4 weeks   PT Treatment/Interventions ADLs/Self Care Home Management;Cryotherapy;Electrical Stimulation;Gait training;Stair training;Functional mobility training;Therapeutic activities;Therapeutic exercise;Balance training;Neuromuscular re-education;Patient/family education;Manual techniques   PT Next Visit Plan Hip strength, balance and manual PRN   PT Home Exercise Plan Eval: bridge, hip abduction/flexion/extension    Consulted and Agree with Plan of Care Patient      Patient will benefit from skilled therapeutic intervention in order to improve the following deficits and impairments:  Abnormal gait, Pain, Decreased activity tolerance, Decreased endurance, Decreased strength, Obesity, Difficulty walking, Decreased balance  Visit Diagnosis: Arthritis of left hip - Plan: PT plan of care cert/re-cert  Muscle weakness (generalized) - Plan: PT plan of care cert/re-cert  Abnormality of gait and mobility - Plan: PT plan of care cert/re-cert      G-Codes - 13/24/40 1851    Functional Assessment Tool Used (Outpatient Only) Foto   Functional Limitation Mobility: Walking and moving around   Mobility: Walking and Moving Around Current Status (N0272) At  least 60 percent but less than 80 percent impaired, limited or restricted   Mobility: Walking and Moving Around Goal Status [phone]) At least 40 percent but less than 60 percent impaired, limited or restricted       Problem List Patient Active Problem List   Diagnosis Date Noted  . Right carotid bruit 10/25/2013  . Diabetes mellitus type 2 in obese ([place])   . Hyperlipidemia with  target LDL less than 70   . Orthostatic hypotension 08/28/2013  . Renal insufficiency 08/19/2013  . Obesity (BMI 30-39.9) 05/05/2013  . CAD S/P percutaneous coronary angioplasty -- LAD/OM1 DES 02/17/13 02/18/2013  . Pulmonary embolism August 2011 (syncope) 02/18/2013  . NSTEMI (non-ST elevated myocardial infarction) ([place]) 02/15/2013  . Essential hypertension 02/15/2013   Starr Lake PT, DPT 6:54 PM, 01/16/17 [place], [place], [address] Phone: [phone]   Fax:  [phone]  Name: Phyllis Conner MRN: [id number] Date of Birth: 12/01/1945

## 2017-01-16 NOTE — Patient Instructions (Signed)
  BRIDGING  While lying on your back with knees bent, tighten your lower abdominals, squeeze your buttocks and then raise your buttocks off the floor/bed as creating a "Bridge" with your body. Hold and then lower yourself and repeat.   Complete 10 times, 3 second hold   HIP ABDUCTION - STANDING   While standing, raise your leg out to the side. Keep your knee straight and maintain your toes pointed forward the entire time.    Use your arms for support if needed for balance and safety.   Complete 10 times, 3 second hold   HIP EXTENSION - STANDING  While standing, balance on one leg and move your other leg in a backward direction. Do not swing the leg. Perform smooth and controlled movements.   Keep your trunk stable and without arching during the movement.   Use your arms for support if needed for balance and safety.   Complete 10 times, 3 sec hold   HIP FLEXION - STANDING - SLR  While standing on one leg, lift your other leg forward with a straight knee as shown. Return to starting position and repeat.   Use your arms for support if needed for balance and safety.   Complete 10 times, 3sec hold

## 2017-01-17 ENCOUNTER — Ambulatory Visit (HOSPITAL_COMMUNITY): Payer: Medicare Other

## 2017-01-17 ENCOUNTER — Encounter (HOSPITAL_COMMUNITY): Payer: Self-pay

## 2017-01-17 DIAGNOSIS — M1612 Unilateral primary osteoarthritis, left hip: Secondary | ICD-10-CM | POA: Diagnosis not present

## 2017-01-17 DIAGNOSIS — R269 Unspecified abnormalities of gait and mobility: Secondary | ICD-10-CM | POA: Diagnosis not present

## 2017-01-17 DIAGNOSIS — M6281 Muscle weakness (generalized): Secondary | ICD-10-CM

## 2017-01-17 NOTE — Therapy (Signed)
[name], [place], [address] Phone: [phone]   Fax:  [phone]  Physical Therapy Treatment  Patient Details  Name: Phyllis Conner MRN: [id number] Date of Birth: Jan 08, 1946 Referring Provider: Arther Abbott MD  Encounter Date: 01/17/2017      PT End of Session - 01/17/17 0948    Visit Number 2   Number of Visits 9   Date for PT Re-Evaluation 02/06/17   Authorization Type UHC Medicare   Authorization - Visit Number 2   Authorization - Number of Visits 10   PT Start Time [phone]   PT Stop Time 1030   PT Time Calculation (min) 43 min   Activity Tolerance Patient tolerated treatment well;No increased pain   Behavior During Therapy WFL for tasks assessed/performed      Past Medical History:  Diagnosis Date  . CAD S/P percutaneous coronary angioplasty November 2014   Mid LAD 2.5 mm x 12 mm Promus DES (2.70 mm); proximal OM1 - Promus DES 2.75 mm x 16 mm (2.85 mm)  . Diabetes mellitus type 2 in obese ([place])   . Essential hypertension   . Hyperlipidemia with target LDL less than 70   . Non-Q wave ST elevation myocardial infarction (STEMI) involving left anterior descending (LAD) coronary artery November 2014   PCI to LAD and circumflex  . Obesity (BMI 30-39.9) 05/05/2013    Past Surgical History:  Procedure Laterality Date  . ABDOMINAL HYSTERECTOMY  1990s  . Arthroscopic knee surgery Bilateral 1984, 2002   Arthroscopic  . CARDIAC CATHETERIZATION  November 2014   Mid LAD 95%, proximal OM1 80-90% --> two-vessel PCI  . COLONOSCOPY N/A 12/05/2013   Procedure: COLONOSCOPY;  Surgeon: Beryle Beams, MD;  [facility]: [facility];  Service: Endoscopy;  Laterality: N/A;  . COLONOSCOPY WITH PROPOFOL N/A 12/11/2014   Procedure: COLONOSCOPY WITH PROPOFOL;  Surgeon: Carol Ada, MD;  Location: [facility];  Service: Endoscopy;  Laterality: N/A;  . ESOPHAGOGASTRODUODENOSCOPY N/A 12/05/2013   Procedure:  ESOPHAGOGASTRODUODENOSCOPY (EGD);  Surgeon: Beryle Beams, MD;  Location: [facility];  Service: Endoscopy;  Laterality: N/A;  . LEFT HEART CATHETERIZATION WITH CORONARY ANGIOGRAM N/A 02/17/2013   Procedure: LEFT HEART CATHETERIZATION WITH CORONARY ANGIOGRAM;  Surgeon: Sanda Klein, MD;  Location: [facility]ATH [facility];  Service: Cardiovascular;  Laterality: N/A;  . PERCUTANEOUS CORONARY STENT INTERVENTION (PCI-S)  November 2014   Mid LAD 2.5 mm x 12 mm (2.75 mm) Promus DES; OM1 2.75 mm x 60 mm (2.85 mm) Promus DES  . TRANSTHORACIC ECHOCARDIOGRAM  02/16/2013   EF 40-50%. Moderate concentric LVH. Moderate H. Shea of mid-distal septal and apical region. Grade 1 diastolic dysfunction. Moderate aortic sclerosis without stenosis.    There were no vitals filed for this visit.      Subjective Assessment - 01/17/17 0947    Subjective Pt reports her hip is feeling good today, has began HEP without questions.  Has taken pain medication prior tx.  Reports steps is the most difficult functinal tasks.   Pertinent History Previous Lt. knee 1980s, unremarkeable otherwise    Patient Stated Goals To improve pain so she can get back to work and be more active    Currently in Pain? No/denies              [facility] Adult PT Treatment/Exercise - 01/17/17 0001      Knee/Hip Exercises: Standing   Heel Raises 15 reps   Heel Raises Limitations heel and toe raises with minimal  HHA   Hip Flexion Both;10 reps   Hip Flexion Limitations 5" holds with intermittent HHA   Hip Abduction Both;10 reps   Abduction Limitations 5" holds, cueing to reduce ER   Hip Extension Both;10 reps   Extension Limitations 5" holds   Forward Step Up Both;10 reps;Hand Hold: 1;Step Height: 4"   Forward Step Up Limitations 4in step height   Functional Squat 10 reps   Functional Squat Limitations front of chair for form   SLS Rt 6", Lt 6" max of 3     Knee/Hip Exercises: Seated   Sit to Sand 10 reps;without UE support  eccentric control      Knee/Hip Exercises: Supine   Bridges 15 reps     Knee/Hip Exercises: Sidelying   Hip ABduction 10 reps   Hip ABduction Limitations cueing for form and to reduce ER                PT Education - 01/17/17 0954    Education provided Yes   Education Details Reviewed goals, assured compliance and proper form with HEP and copy of eval given to pt.     Person(s) Educated Patient   Methods Explanation;Demonstration;Handout   Comprehension Verbalized understanding;Returned demonstration;Verbal cues required  cueing for form initially          PT Short Term Goals - 01/16/17 1835      PT SHORT TERM GOAL #1   Title Patient will be compliant with HEP to improve functional mobility and verbal pain ratings.    Time 2   Period Weeks   Status New     PT SHORT TERM GOAL #2   Title Patient will have improved single leg balance to at least 15 seconds each leg to improve safety during ambulation and gait mechanics.    Time 2   Period Weeks   Status New           PT Long Term Goals - 01/16/17 1836      PT LONG TERM GOAL #1   Title Patient will have improved functional mobility noted by improved 3MWT to 678 ft at least with improved gait mechanics.    Time 4   Period Weeks   Status New     PT LONG TERM GOAL #2   Title Patient will have improved hip strength by 1 MMT grade to improve stance support during ambulation and to decrease pain.    Time 4   Period Weeks   Status New     PT LONG TERM GOAL #3   Title Patient will have improved quality of life rating noted by improve FOTO score by 15%.    Time 4   Period Weeks   Status New               Plan - 01/17/17 1610    Clinical Impression Statement Reviewed goals, assured compliance and proper form with HEP and copy of eval given to pt.  Session focus on hip strengthening in [place] and [facility].  Cueing for form through session for proper muscule use and control for strengthening.  Pt was limited by fatigue, no  reports of pain at EOS.     Rehab Potential Good   PT Frequency 2x / week   PT Duration 4 weeks   PT Treatment/Interventions ADLs/Self Care Home Management;Cryotherapy;Electrical Stimulation;Gait training;Stair training;Functional mobility training;Therapeutic activities;Therapeutic exercise;Balance training;Neuromuscular re-education;Patient/family education;Manual techniques   PT Next Visit Plan Hip strength, balance and manual PRN   PT Home  Exercise Plan Eval: bridge, hip abduction/flexion/extension       Patient will benefit from skilled therapeutic intervention in order to improve the following deficits and impairments:  Abnormal gait, Pain, Decreased activity tolerance, Decreased endurance, Decreased strength, Obesity, Difficulty walking, Decreased balance  Visit Diagnosis: Arthritis of left hip  Muscle weakness (generalized)  Abnormality of gait and mobility       G-Codes - Feb 14, 2017 1851    Functional Assessment Tool Used (Outpatient Only) Foto   Functional Limitation Mobility: Walking and moving around   Mobility: Walking and Moving Around Current Status [phone]) At least 60 percent but less than 80 percent impaired, limited or restricted   Mobility: Walking and Moving Around Goal Status [phone]) At least 40 percent but less than 60 percent impaired, limited or restricted      Problem List Patient Active Problem List   Diagnosis Date Noted  . Right carotid bruit 10/25/2013  . Diabetes mellitus type 2 in obese ([place])   . Hyperlipidemia with target LDL less than 70   . Orthostatic hypotension 08/28/2013  . Renal insufficiency 08/19/2013  . Obesity (BMI 30-39.9) 05/05/2013  . CAD S/P percutaneous coronary angioplasty -- LAD/OM1 DES 02/17/13 02/18/2013  . Pulmonary embolism August 2011 (syncope) 02/18/2013  . NSTEMI (non-ST elevated myocardial infarction) ([place]) 02/15/2013  . Essential hypertension 02/15/2013   Ihor Austin, [place]; [place]  Aldona Lento 01/17/2017, 10:35 AM  [place] [address] [place], [place], [address] Phone: [phone]   Fax:  [phone]  Name: LANDREY MAHURIN MRN: [id number] Date of Birth: 04/10/1946

## 2017-01-23 ENCOUNTER — Ambulatory Visit (HOSPITAL_COMMUNITY): Payer: Medicare Other

## 2017-01-23 DIAGNOSIS — M1612 Unilateral primary osteoarthritis, left hip: Secondary | ICD-10-CM | POA: Diagnosis not present

## 2017-01-23 DIAGNOSIS — M6281 Muscle weakness (generalized): Secondary | ICD-10-CM

## 2017-01-23 DIAGNOSIS — R269 Unspecified abnormalities of gait and mobility: Secondary | ICD-10-CM

## 2017-01-23 NOTE — Therapy (Signed)
[place], [place], [address] Phone: [phone]   Fax:  [phone]  Physical Therapy Treatment  Patient Details  Name: Phyllis Conner MRN: [id number] Date of Birth: 1946/03/29 Referring Provider: Arther Abbott MD  Encounter Date: 01/23/2017      PT End of Session - 01/23/17 0906    Visit Number 3   Number of Visits 9   Date for PT Re-Evaluation 02/06/17   Authorization Type [employer] Medicare   Authorization - Visit Number 3   Authorization - Number of Visits 10   PT Start Time 0901   PT Stop Time 0943   PT Time Calculation (min) 42 min   Activity Tolerance Patient tolerated treatment well;No increased pain   Behavior During Therapy WFL for tasks assessed/performed      Past Medical History:  Diagnosis Date  . CAD S/P percutaneous coronary angioplasty November 2014   Mid LAD 2.5 mm x 12 mm Promus DES (2.70 mm); proximal OM1 - Promus DES 2.75 mm x 16 mm (2.85 mm)  . Diabetes mellitus type 2 in obese ([place])   . Essential hypertension   . Hyperlipidemia with target LDL less than 70   . Non-Q wave ST elevation myocardial infarction (STEMI) involving left anterior descending (LAD) coronary artery November 2014   PCI to LAD and circumflex  . Obesity (BMI 30-39.9) 05/05/2013    Past Surgical History:  Procedure Laterality Date  . ABDOMINAL HYSTERECTOMY  1990s  . Arthroscopic knee surgery Bilateral 1984, 2002   Arthroscopic  . CARDIAC CATHETERIZATION  November 2014   Mid LAD 95%, proximal OM1 80-90% --> two-vessel PCI  . COLONOSCOPY N/A 12/05/2013   Procedure: COLONOSCOPY;  Surgeon: Beryle Beams, MD;  Location: [facility];  Service: Endoscopy;  Laterality: N/A;  . COLONOSCOPY WITH PROPOFOL N/A 12/11/2014   Procedure: COLONOSCOPY WITH PROPOFOL;  Surgeon: Carol Ada, MD;  Location: [facility];  Service: Endoscopy;  Laterality: N/A;  . ESOPHAGOGASTRODUODENOSCOPY N/A 12/05/2013   Procedure:  ESOPHAGOGASTRODUODENOSCOPY (EGD);  Surgeon: Beryle Beams, MD;  Location: [facility];  Service: Endoscopy;  Laterality: N/A;  . LEFT HEART CATHETERIZATION WITH CORONARY ANGIOGRAM N/A 02/17/2013   Procedure: LEFT HEART CATHETERIZATION WITH CORONARY ANGIOGRAM;  Surgeon: Sanda Klein, MD;  Location: [facility];  Service: Cardiovascular;  Laterality: N/A;  . PERCUTANEOUS CORONARY STENT INTERVENTION (PCI-S)  November 2014   Mid LAD 2.5 mm x 12 mm (2.75 mm) Promus DES; OM1 2.75 mm x 60 mm (2.85 mm) Promus DES  . TRANSTHORACIC ECHOCARDIOGRAM  02/16/2013   EF 40-50%. Moderate concentric LVH. Moderate H. Shea of mid-distal septal and apical region. Grade 1 diastolic dysfunction. Moderate aortic sclerosis without stenosis.    There were no vitals filed for this visit.      Subjective Assessment - 01/23/17 0905    Subjective Pt reports she is feeling good today, was a little sore in hip and knees following last session.  No reports of pain currently.  Reports compliance with HEP.   Currently in Pain? No/denies                         [facility] Adult PT Treatment/Exercise - 01/23/17 0001      Knee/Hip Exercises: Stretches   Hip Flexor Stretch Both;1 rep;20 seconds  on 2nd step     Knee/Hip Exercises: Standing   Heel Raises 15 reps   Heel Raises Limitations heel and toe raises with minimal HHA  Abduction Limitations 2RT sidestep RTB   Forward Step Up Both;10 reps;Hand Hold: 1;Step Height: 4"   Forward Step Up Limitations 4in step height   Functional Squat 10 reps   Functional Squat Limitations front of chair for form   Rocker Board 2 minutes   Rocker Board Limitations R/L   SLS Rt 5", Lt 9" max of 3   Other Standing Knee Exercises 3D hip excursion   Other Standing Knee Exercises tandem stance 2x 30" on foam     Knee/Hip Exercises: Seated   Sit to Sand 15 reps;without UE support  eccentric control     Knee/Hip Exercises: Supine   Bridges 15 reps   Bridges Limitations  3" holds     Knee/Hip Exercises: Sidelying   Hip ABduction 15 reps   Hip ABduction Limitations cueing for form and to reduce ER                  PT Short Term Goals - 01/16/17 1835      PT SHORT TERM GOAL #1   Title Patient will be compliant with HEP to improve functional mobility and verbal pain ratings.    Time 2   Period Weeks   Status New     PT SHORT TERM GOAL #2   Title Patient will have improved single leg balance to at least 15 seconds each leg to improve safety during ambulation and gait mechanics.    Time 2   Period Weeks   Status New           PT Long Term Goals - 01/16/17 1836      PT LONG TERM GOAL #1   Title Patient will have improved functional mobility noted by improved 3MWT to 678 ft at least with improved gait mechanics.    Time 4   Period Weeks   Status New     PT LONG TERM GOAL #2   Title Patient will have improved hip strength by 1 MMT grade to improve stance support during ambulation and to decrease pain.    Time 4   Period Weeks   Status New     PT LONG TERM GOAL #3   Title Patient will have improved quality of life rating noted by improve FOTO score by 15%.    Time 4   Period Weeks   Status New               Plan - 01/23/17 1143    Clinical Impression Statement Session focus with hip and LE strengthening.  Progressed to increased CKC for functional strengthening and began balance training.  Cueing to improve form with new exercises.  No reports of pain through session, was limited by fatigue with tasks.     Rehab Potential Good   PT Frequency 2x / week   PT Duration 4 weeks   PT Treatment/Interventions ADLs/Self Care Home Management;Cryotherapy;Electrical Stimulation;Gait training;Stair training;Functional mobility training;Therapeutic activities;Therapeutic exercise;Balance training;Neuromuscular re-education;Patient/family education;Manual techniques   PT Next Visit Plan Continue hip strength, balance and manual PRN.   Next session begin lateral step ups and progress to lunges.     PT Home Exercise Plan Eval: bridge, hip abduction/flexion/extension       Patient will benefit from skilled therapeutic intervention in order to improve the following deficits and impairments:  Abnormal gait, Pain, Decreased activity tolerance, Decreased endurance, Decreased strength, Obesity, Difficulty walking, Decreased balance  Visit Diagnosis: Arthritis of left hip  Muscle weakness (generalized)  Abnormality of gait and mobility  Problem List Patient Active Problem List   Diagnosis Date Noted  . Right carotid bruit 10/25/2013  . Diabetes mellitus type 2 in obese ([place])   . Hyperlipidemia with target LDL less than 70   . Orthostatic hypotension 08/28/2013  . Renal insufficiency 08/19/2013  . Obesity (BMI 30-39.9) 05/05/2013  . CAD S/P percutaneous coronary angioplasty -- LAD/OM1 DES 02/17/13 02/18/2013  . Pulmonary embolism August 2011 (syncope) 02/18/2013  . NSTEMI (non-ST elevated myocardial infarction) ([place]) 02/15/2013  . Essential hypertension 02/15/2013   Ihor Austin, [place]; [place]  Aldona Lento 01/23/2017, 11:47 AM  [facility] [address] [place], [place], [address] Phone: [phone]   Fax:  [phone]  Name: SIEDAH SEDOR MRN: [id number] Date of Birth: 24-Apr-1945

## 2017-01-25 ENCOUNTER — Ambulatory Visit (HOSPITAL_COMMUNITY): Payer: Medicare Other | Admitting: Physical Therapy

## 2017-01-25 DIAGNOSIS — M1612 Unilateral primary osteoarthritis, left hip: Secondary | ICD-10-CM

## 2017-01-25 DIAGNOSIS — Z0000 Encounter for general adult medical examination without abnormal findings: Secondary | ICD-10-CM | POA: Diagnosis not present

## 2017-01-25 DIAGNOSIS — M6281 Muscle weakness (generalized): Secondary | ICD-10-CM

## 2017-01-25 DIAGNOSIS — M150 Primary generalized (osteo)arthritis: Secondary | ICD-10-CM | POA: Diagnosis not present

## 2017-01-25 DIAGNOSIS — R269 Unspecified abnormalities of gait and mobility: Secondary | ICD-10-CM | POA: Diagnosis not present

## 2017-01-25 DIAGNOSIS — Z23 Encounter for immunization: Secondary | ICD-10-CM | POA: Diagnosis not present

## 2017-01-25 NOTE — Therapy (Signed)
[place], [place], [address] Phone: [phone]   Fax:  [phone]  Physical Therapy Treatment  Patient Details  Name: Phyllis Conner MRN: [id number] Date of Birth: [birth year] Referring Provider: Arther Abbott MD  Encounter Date: 01/25/2017      PT End of Session - 01/25/17 0955    Visit Number 4   Number of Visits 9   Date for PT Re-Evaluation 02/06/17   Authorization Type UHC Medicare   Authorization - Visit Number 4   Authorization - Number of Visits 10   PT Start Time 0910   PT Stop Time 0955   PT Time Calculation (min) 45 min   Activity Tolerance Patient tolerated treatment well;No increased pain   Behavior During Therapy WFL for tasks assessed/performed      Past Medical History:  Diagnosis Date  . CAD S/P percutaneous coronary angioplasty November 2014   Mid LAD 2.5 mm x 12 mm Promus DES (2.70 mm); proximal OM1 - Promus DES 2.75 mm x 16 mm (2.85 mm)  . Diabetes mellitus type 2 in obese ([place])   . Essential hypertension   . Hyperlipidemia with target LDL less than 70   . Non-Q wave ST elevation myocardial infarction (STEMI) involving left anterior descending (LAD) coronary artery November 2014   PCI to LAD and circumflex  . Obesity (BMI 30-39.9) 05/05/2013    Past Surgical History:  Procedure Laterality Date  . ABDOMINAL HYSTERECTOMY  1990s  . Arthroscopic knee surgery Bilateral 1984, 2002   Arthroscopic  . CARDIAC CATHETERIZATION  November 2014   Mid LAD 95%, proximal OM1 80-90% --> two-vessel PCI  . COLONOSCOPY N/A 12/05/2013   Procedure: COLONOSCOPY;  Surgeon: Beryle Beams, MD;  [facility]: [facility];  Service: Endoscopy;  Laterality: N/A;  . COLONOSCOPY WITH PROPOFOL N/A 12/11/2014   Procedure: COLONOSCOPY WITH PROPOFOL;  Surgeon: Carol Ada, MD;  Location: [facility];  Service: Endoscopy;  Laterality: N/A;  . ESOPHAGOGASTRODUODENOSCOPY N/A 12/05/2013   Procedure:  ESOPHAGOGASTRODUODENOSCOPY (EGD);  Surgeon: Beryle Beams, MD;  Location: [facility];  Service: Endoscopy;  Laterality: N/A;  . LEFT HEART CATHETERIZATION WITH CORONARY ANGIOGRAM N/A 02/17/2013   Procedure: LEFT HEART CATHETERIZATION WITH CORONARY ANGIOGRAM;  Surgeon: Sanda Klein, MD;  Location: [facility];  Service: Cardiovascular;  Laterality: N/A;  . PERCUTANEOUS CORONARY STENT INTERVENTION (PCI-S)  November 2014   Mid LAD 2.5 mm x 12 mm (2.75 mm) Promus DES; OM1 2.75 mm x 60 mm (2.85 mm) Promus DES  . TRANSTHORACIC ECHOCARDIOGRAM  02/16/2013   EF 40-50%. Moderate concentric LVH. Moderate H. Shea of mid-distal septal and apical region. Grade 1 diastolic dysfunction. Moderate aortic sclerosis without stenosis.    There were no vitals filed for this visit.      Subjective Assessment - 01/25/17 0915    Subjective Pt states that she always limps in the mornings.  She is doing a few exercises at home    Pertinent History Previous Lt. knee 1980s, unremarkeable otherwise    Patient Stated Goals To improve pain so she can get back to work and be more active    Pain Score 7    Pain Location Hip   Pain Orientation Left   Pain Descriptors / Indicators Aching   Pain Type Chronic pain   Pain Frequency Intermittent   Aggravating Factors  mornings    Multiple Pain Sites Yes   Pain Score 7   Pain Location Knee   Pain Orientation  Right;Left   Pain Descriptors / Indicators Aching   Pain Onset More than a month ago   Pain Frequency Intermittent   Aggravating Factors  weight bearing    Pain Relieving Factors rest    Effect of Pain on Daily Activities increases                         [facility] Adult PT Treatment/Exercise - 01/25/17 0001      Knee/Hip Exercises: Stretches   Other Knee/Hip Stretches 3 D hip excursion x 3    Other Knee/Hip Stretches slant board 3x 30"      Knee/Hip Exercises: Standing   Forward Lunges 10 reps;Both   Abduction Limitations 2 RT green TB     Rocker Board 2 minutes   Other Standing Knee Exercises 3D hip excursion   Other Standing Knee Exercises Forearm on counter; hip extension x 10      Knee/Hip Exercises: Seated   Sit to Sand 20 reps; (10 with rt in front; 10 with left in front)                  PT Short Term Goals - 01/16/17 1835      PT SHORT TERM GOAL #1   Title Patient will be compliant with HEP to improve functional mobility and verbal pain ratings.    Time 2   Period Weeks   Status New     PT SHORT TERM GOAL #2   Title Patient will have improved single leg balance to at least 15 seconds each leg to improve safety during ambulation and gait mechanics.    Time 2   Period Weeks   Status New           PT Long Term Goals - 01/16/17 1836      PT LONG TERM GOAL #1   Title Patient will have improved functional mobility noted by improved 3MWT to 678 ft at least with improved gait mechanics.    Time 4   Period Weeks   Status New     PT LONG TERM GOAL #2   Title Patient will have improved hip strength by 1 MMT grade to improve stance support during ambulation and to decrease pain.    Time 4   Period Weeks   Status New     PT LONG TERM GOAL #3   Title Patient will have improved quality of life rating noted by improve FOTO score by 15%.    Time 4   Period Weeks   Status New               Plan - 01/25/17 0956    Clinical Impression Statement Session focused on completing exercise with hips in a neutral postition rather than ER.  This is very difficult for pt but she is able to complete this with increased concentration and verbal cuing.    Rehab Potential Good   PT Frequency 2x / week   PT Duration 4 weeks   PT Treatment/Interventions ADLs/Self Care Home Management;Cryotherapy;Electrical Stimulation;Gait training;Stair training;Functional mobility training;Therapeutic activities;Therapeutic exercise;Balance training;Neuromuscular re-education;Patient/family education;Manual techniques    PT Next Visit Plan Continue hip strength, balance and manual PRN.  Next session begin lateral step ups as time restrained this from being completed this session.    PT Home Exercise Plan Eval: bridge, hip abduction/flexion/extension       Patient will benefit from skilled therapeutic intervention in order to improve the following deficits and impairments:  Abnormal gait, Pain, Decreased activity tolerance, Decreased endurance, Decreased strength, Obesity, Difficulty walking, Decreased balance  Visit Diagnosis: No diagnosis found.     Problem List Patient Active Problem List   Diagnosis Date Noted  . Right carotid bruit 10/25/2013  . Diabetes mellitus type 2 in obese ([place])   . Hyperlipidemia with target LDL less than 70   . Orthostatic hypotension 08/28/2013  . Renal insufficiency 08/19/2013  . Obesity (BMI 30-39.9) 05/05/2013  . CAD S/P percutaneous coronary angioplasty -- LAD/OM1 DES 02/17/13 02/18/2013  . Pulmonary embolism August 2011 (syncope) 02/18/2013  . NSTEMI (non-ST elevated myocardial infarction) ([place]) 02/15/2013  . Essential hypertension 02/15/2013   Rayetta Humphrey, PT CLT [phone] 01/25/2017, 9:58 AM  [facility] [address] [place], [place], [address] Phone: [phone]   Fax:  [phone]  Name: Phyllis Conner MRN: [id number] Date of Birth: 06/10/1945

## 2017-01-30 ENCOUNTER — Ambulatory Visit (HOSPITAL_COMMUNITY): Payer: Medicare Other

## 2017-01-30 ENCOUNTER — Encounter (HOSPITAL_COMMUNITY): Payer: Self-pay

## 2017-01-30 DIAGNOSIS — M6281 Muscle weakness (generalized): Secondary | ICD-10-CM | POA: Diagnosis not present

## 2017-01-30 DIAGNOSIS — R269 Unspecified abnormalities of gait and mobility: Secondary | ICD-10-CM

## 2017-01-30 DIAGNOSIS — M1612 Unilateral primary osteoarthritis, left hip: Secondary | ICD-10-CM | POA: Diagnosis not present

## 2017-01-30 NOTE — Therapy (Signed)
[place], [place], [address] Phone: [phone]   Fax:  [phone]  Physical Therapy Treatment  Patient Details  Name: Phyllis Conner MRN: [id number] Date of Birth: 1945-04-19 Referring Provider: Arther Abbott MD  Encounter Date: 01/30/2017      PT End of Session - 01/30/17 1042    Visit Number 5   Number of Visits 9   Date for PT Re-Evaluation 02/06/17   Authorization Type [employer]HC Medicare   Authorization - Visit Number 5   Authorization - Number of Visits 10   PT Start Time 2094   PT Stop Time 1115   PT Time Calculation (min) 38 min   Activity Tolerance Patient tolerated treatment well;No increased pain   Behavior During Therapy WFL for tasks assessed/performed      Past Medical History:  Diagnosis Date  . CAD S/P percutaneous coronary angioplasty November 2014   Mid LAD 2.5 mm x 12 mm Promus DES (2.70 mm); proximal OM1 - Promus DES 2.75 mm x 16 mm (2.85 mm)  . Diabetes mellitus type 2 in obese ([place])   . Essential hypertension   . Hyperlipidemia with target LDL less than 70   . Non-Q wave ST elevation myocardial infarction (STEMI) involving left anterior descending (LAD) coronary artery November 2014   PCI to LAD and circumflex  . Obesity (BMI 30-39.9) 05/05/2013    Past Surgical History:  Procedure Laterality Date  . ABDOMINAL HYSTERECTOMY  1990s  . Arthroscopic knee surgery Bilateral 1984, 2002   Arthroscopic  . CARDIAC CATHETERIZATION  November 2014   Mid LAD 95%, proximal OM1 80-90% --> two-vessel PCI  . COLONOSCOPY N/A 12/05/2013   Procedure: COLONOSCOPY;  Surgeon: Beryle Beams, MD;  [facility]: [facility];  Service: Endoscopy;  Laterality: N/A;  . COLONOSCOPY WITH PROPOFOL N/A 12/11/2014   Procedure: COLONOSCOPY WITH PROPOFOL;  Surgeon: Carol Ada, MD;  Location: [facility];  Service: Endoscopy;  Laterality: N/A;  . ESOPHAGOGASTRODUODENOSCOPY N/A 12/05/2013   Procedure:  ESOPHAGOGASTRODUODENOSCOPY (EGD);  Surgeon: Beryle Beams, MD;  Location: [facility];  Service: Endoscopy;  Laterality: N/A;  . LEFT HEART CATHETERIZATION WITH CORONARY ANGIOGRAM N/A 02/17/2013   Procedure: LEFT HEART CATHETERIZATION WITH CORONARY ANGIOGRAM;  Surgeon: Sanda Klein, MD;  Location: [facility];  Service: Cardiovascular;  Laterality: N/A;  . PERCUTANEOUS CORONARY STENT INTERVENTION (PCI-S)  November 2014   Mid LAD 2.5 mm x 12 mm (2.75 mm) Promus DES; OM1 2.75 mm x 60 mm (2.85 mm) Promus DES  . TRANSTHORACIC ECHOCARDIOGRAM  02/16/2013   EF 40-50%. Moderate concentric LVH. Moderate H. Shea of mid-distal septal and apical region. Grade 1 diastolic dysfunction. Moderate aortic sclerosis without stenosis.    There were no vitals filed for this visit.      Subjective Assessment - 01/30/17 1039    Subjective Pt stated her Rt knee has a little nagging, pain scale 6/10.   Patient Stated Goals To improve pain so she can get back to work and be more active    Currently in Pain? Yes   Pain Score 6    Pain Location Knee   Pain Orientation Right   Pain Descriptors / Indicators Aching;Nagging   Pain Type Chronic pain   Pain Onset More than a month ago   Pain Frequency Intermittent   Aggravating Factors  morning   Pain Relieving Factors walking helps to decrease pain   Effect of Pain on Daily Activities yes, specifically on rainy days she  will not be able to tolerate much activity                         [facility] Adult PT Treatment/Exercise - 01/30/17 0001      Knee/Hip Exercises: Standing   Forward Lunges 10 reps;Both   Abduction Limitations 2 RT green TB    Hip Extension Both;10 reps;Knee straight   Extension Limitations forearm on counter with hip extension   Lateral Step Up Both;10 reps;Hand Hold: 2;Step Height: 4"   Functional Squat 10 reps   Functional Squat Limitations front of chair for form   Rocker Board 2 minutes   Rocker Board Limitations R/L and  A/P with minimal HHA   SLS Rt 7", Lt 14"   Other Standing Knee Exercises 3D hip excursion   Other Standing Knee Exercises tandem stance 2x30" on foam     Knee/Hip Exercises: Seated   Sit to Sand 20 reps;without UE support  10 reps each LE staggered stance eccentric control                  PT Short Term Goals - 01/16/17 1835      PT SHORT TERM GOAL #1   Title Patient will be compliant with HEP to improve functional mobility and verbal pain ratings.    Time 2   Period Weeks   Status New     PT SHORT TERM GOAL #2   Title Patient will have improved single leg balance to at least 15 seconds each leg to improve safety during ambulation and gait mechanics.    Time 2   Period Weeks   Status New           PT Long Term Goals - 01/16/17 1836      PT LONG TERM GOAL #1   Title Patient will have improved functional mobility noted by improved 3MWT to 678 ft at least with improved gait mechanics.    Time 4   Period Weeks   Status New     PT LONG TERM GOAL #2   Title Patient will have improved hip strength by 1 MMT grade to improve stance support during ambulation and to decrease pain.    Time 4   Period Weeks   Status New     PT LONG TERM GOAL #3   Title Patient will have improved quality of life rating noted by improve FOTO score by 15%.    Time 4   Period Weeks   Status New               Plan - 01/30/17 1117    Clinical Impression Statement Continued session focus on hip strengthening wiht cueing to improve form (verbal cueing through session to decreased ER with activities).  Added lateral step ups for functional quad strengthening and progressed balance activities with additional dynamic surfaces.     Rehab Potential Good   PT Frequency 2x / week   PT Duration 4 weeks   PT Treatment/Interventions ADLs/Self Care Home Management;Cryotherapy;Electrical Stimulation;Gait training;Stair training;Functional mobility training;Therapeutic activities;Therapeutic  exercise;Balance training;Neuromuscular re-education;Patient/family education;Manual techniques   PT Next Visit Plan Continue hip strength, balance and manual PRN.  Begin forward step ups and progress functional strengthening and balance     PT Home Exercise Plan Eval: bridge, hip abduction/flexion/extension       Patient will benefit from skilled therapeutic intervention in order to improve the following deficits and impairments:  Abnormal gait, Pain, Decreased activity tolerance,  Decreased endurance, Decreased strength, Obesity, Difficulty walking, Decreased balance  Visit Diagnosis: Arthritis of left hip  Muscle weakness (generalized)  Abnormality of gait and mobility     Problem List Patient Active Problem List   Diagnosis Date Noted  . Right carotid bruit 10/25/2013  . Diabetes mellitus type 2 in obese ([place])   . Hyperlipidemia with target LDL less than 70   . Orthostatic hypotension 08/28/2013  . Renal insufficiency 08/19/2013  . Obesity (BMI 30-39.9) 05/05/2013  . CAD S/P percutaneous coronary angioplasty -- LAD/OM1 DES 02/17/13 02/18/2013  . Pulmonary embolism August 2011 (syncope) 02/18/2013  . NSTEMI (non-ST elevated myocardial infarction) ([place]) 02/15/2013  . Essential hypertension 02/15/2013   Ihor Austin, [place]; [place]  Aldona Lento 01/30/2017, 11:20 AM  [facility] [address] [place], [place], [address] Phone: [phone]   Fax:  [phone]  Name: Phyllis Conner MRN: [id number] Date of Birth: 1946/03/12

## 2017-02-01 ENCOUNTER — Ambulatory Visit (HOSPITAL_COMMUNITY): Payer: Medicare Other | Admitting: Physical Therapy

## 2017-02-01 DIAGNOSIS — R269 Unspecified abnormalities of gait and mobility: Secondary | ICD-10-CM | POA: Diagnosis not present

## 2017-02-01 DIAGNOSIS — M6281 Muscle weakness (generalized): Secondary | ICD-10-CM | POA: Diagnosis not present

## 2017-02-01 DIAGNOSIS — M1612 Unilateral primary osteoarthritis, left hip: Secondary | ICD-10-CM

## 2017-02-01 NOTE — Therapy (Signed)
[place], [place], [address] Phone: [phone]   Fax:  [phone]  Physical Therapy Treatment  Patient Details  Name: Phyllis Conner MRN: [id number] Date of Birth: 05/19/1945 Referring Provider: Arther Abbott MD  Encounter Date: 02/01/2017      PT End of Session - 02/01/17 1017    Visit Number 6   Number of Visits 9   Date for PT Re-Evaluation 02/06/17   Authorization Type [employer] Medicare   Authorization - Visit Number 6   Authorization - Number of Visits 10   PT Start Time 0905   PT Stop Time 0948   PT Time Calculation (min) 43 min   Activity Tolerance Patient tolerated treatment well;No increased pain   Behavior During Therapy WFL for tasks assessed/performed      Past Medical History:  Diagnosis Date  . CAD S/P percutaneous coronary angioplasty November 2014   Mid LAD 2.5 mm x 12 mm Promus DES (2.70 mm); proximal OM1 - Promus DES 2.75 mm x 16 mm (2.85 mm)  . Diabetes mellitus type 2 in obese ([place])   . Essential hypertension   . Hyperlipidemia with target LDL less than 70   . Non-Q wave ST elevation myocardial infarction (STEMI) involving left anterior descending (LAD) coronary artery November 2014   PCI to LAD and circumflex  . Obesity (BMI 30-39.9) 05/05/2013    Past Surgical History:  Procedure Laterality Date  . ABDOMINAL HYSTERECTOMY  1990s  . Arthroscopic knee surgery Bilateral 1984, 2002   Arthroscopic  . CARDIAC CATHETERIZATION  November 2014   Mid LAD 95%, proximal OM1 80-90% --> two-vessel PCI  . COLONOSCOPY N/A 12/05/2013   Procedure: COLONOSCOPY;  Surgeon: Beryle Beams, MD;  Location: [facility];  Service: Endoscopy;  Laterality: N/A;  . COLONOSCOPY WITH PROPOFOL N/A 12/11/2014   Procedure: COLONOSCOPY WITH PROPOFOL;  Surgeon: Carol Ada, MD;  Location: [facility];  Service: Endoscopy;  Laterality: N/A;  . ESOPHAGOGASTRODUODENOSCOPY N/A 12/05/2013   Procedure:  ESOPHAGOGASTRODUODENOSCOPY (EGD);  Surgeon: Beryle Beams, MD;  Location: [facility];  Service: Endoscopy;  Laterality: N/A;  . LEFT HEART CATHETERIZATION WITH CORONARY ANGIOGRAM N/A 02/17/2013   Procedure: LEFT HEART CATHETERIZATION WITH CORONARY ANGIOGRAM;  Surgeon: Sanda Klein, MD;  Location: [facility];  Service: Cardiovascular;  Laterality: N/A;  . PERCUTANEOUS CORONARY STENT INTERVENTION (PCI-S)  November 2014   Mid LAD 2.5 mm x 12 mm (2.75 mm) Promus DES; OM1 2.75 mm x 60 mm (2.85 mm) Promus DES  . TRANSTHORACIC ECHOCARDIOGRAM  02/16/2013   EF 40-50%. Moderate concentric LVH. Moderate H. Shea of mid-distal septal and apical region. Grade 1 diastolic dysfunction. Moderate aortic sclerosis without stenosis.    There were no vitals filed for this visit.      Subjective Assessment - 02/01/17 0935    Subjective Pt states her knee is hurting worse today, maybe because of the rain that is coming.  Currently reports 9/10 pain in her Rt knee, however denies severety to go to ED.  States she has lost 3 # since beginning therapy.   Currently in Pain? Yes   Pain Score 9    Pain Location Knee   Pain Orientation Right   Pain Descriptors / Indicators Aching;Nagging   Pain Type Chronic pain                         [facility] Adult PT Treatment/Exercise - 02/01/17 0001  Knee/Hip Exercises: Stretches   Other Knee/Hip Stretches 3 D hip excursion x 5    Other Knee/Hip Stretches slant board 3x 30"      Knee/Hip Exercises: Standing   Forward Lunges Both;15 reps   Forward Lunges Limitations  no Ue's onto 4" box   Hip Abduction Both;15 reps   Abduction Limitations with UE assist at bars   Hip Extension Both;Knee straight;15 reps   Extension Limitations with UE assist at bars   Lateral Step Up Both;10 reps;Step Height: 4";Hand Hold: 1   Lateral Step Up Limitations bilaterally 1 HHA   Forward Step Up Both;10 reps;Hand Hold: 1;Step Height: 4"   Forward Step Up Limitations  4in step height   Other Standing Knee Exercises 3D hip excursion   Other Standing Knee Exercises sidestepping with GTB 2RT, tandem gait 2RT                  PT Short Term Goals - 01/16/17 1835      PT SHORT TERM GOAL #1   Title Patient will be compliant with HEP to improve functional mobility and verbal pain ratings.    Time 2   Period Weeks   Status New     PT SHORT TERM GOAL #2   Title Patient will have improved single leg balance to at least 15 seconds each leg to improve safety during ambulation and gait mechanics.    Time 2   Period Weeks   Status New           PT Long Term Goals - 01/16/17 1836      PT LONG TERM GOAL #1   Title Patient will have improved functional mobility noted by improved 3MWT to 678 ft at least with improved gait mechanics.    Time 4   Period Weeks   Status New     PT LONG TERM GOAL #2   Title Patient will have improved hip strength by 1 MMT grade to improve stance support during ambulation and to decrease pain.    Time 4   Period Weeks   Status New     PT LONG TERM GOAL #3   Title Patient will have improved quality of life rating noted by improve FOTO score by 15%.    Time 4   Period Weeks   Status New               Plan - 02/01/17 1024    Clinical Impression Statement Focus of session remained on LE strengthening.  Able to resume forward step ups and progressed dynamic balance with tandem  side stepping with GTB.  Pt with cues to complete descent to chair eccentrically.  Pt wtihout c/o pain and no aggrevation of knees with therex. Reported reduction of pain to 6/10 at end of session (drop of 3 levels)   Rehab Potential Good   PT Frequency 2x / week   PT Duration 4 weeks   PT Treatment/Interventions ADLs/Self Care Home Management;Cryotherapy;Electrical Stimulation;Gait training;Stair training;Functional mobility training;Therapeutic activities;Therapeutic exercise;Balance training;Neuromuscular  re-education;Patient/family education;Manual techniques   PT Next Visit Plan Continue hip strength, balance and manual PRN. Continue to progress functional strengthening and balance     PT Home Exercise Plan Eval: bridge, hip abduction/flexion/extension       Patient will benefit from skilled therapeutic intervention in order to improve the following deficits and impairments:  Abnormal gait, Pain, Decreased activity tolerance, Decreased endurance, Decreased strength, Obesity, Difficulty walking, Decreased balance  Visit Diagnosis: Arthritis of left  hip  Muscle weakness (generalized)  Abnormality of gait and mobility     Problem List Patient Active Problem List   Diagnosis Date Noted  . Right carotid bruit 10/25/2013  . Diabetes mellitus type 2 in obese ([place])   . Hyperlipidemia with target LDL less than 70   . Orthostatic hypotension 08/28/2013  . Renal insufficiency 08/19/2013  . Obesity (BMI 30-39.9) 05/05/2013  . CAD S/P percutaneous coronary angioplasty -- LAD/OM1 DES 02/17/13 02/18/2013  . Pulmonary embolism August 2011 (syncope) 02/18/2013  . NSTEMI (non-ST elevated myocardial infarction) ([place]) 02/15/2013  . Essential hypertension 02/15/2013   Teena Irani, PTA/CLT [phone]  Teena Irani 02/01/2017, 10:31 AM  [place], [place], [address] Phone: [phone]   Fax:  [phone]  Name: SHARUNDA SALMON MRN: [id number] Date of Birth: 1945-07-18

## 2017-02-05 ENCOUNTER — Ambulatory Visit (HOSPITAL_COMMUNITY): Payer: Medicare Other | Admitting: Physical Therapy

## 2017-02-05 DIAGNOSIS — R269 Unspecified abnormalities of gait and mobility: Secondary | ICD-10-CM | POA: Diagnosis not present

## 2017-02-05 DIAGNOSIS — M6281 Muscle weakness (generalized): Secondary | ICD-10-CM

## 2017-02-05 DIAGNOSIS — M1612 Unilateral primary osteoarthritis, left hip: Secondary | ICD-10-CM | POA: Diagnosis not present

## 2017-02-05 NOTE — Therapy (Signed)
[place], [place], [address] Phone: [phone]   Fax:  [phone]  Physical Therapy Treatment  Patient Details  Name: Phyllis Conner MRN: [id number] Date of Birth: [birth year] Referring Provider: Arther Abbott MD  Encounter Date: 02/05/2017      PT End of Session - 02/05/17 0933    Visit Number 7   Number of Visits 9   Date for PT Re-Evaluation 02/06/17   Authorization Type [employer] Medicare   Authorization - Visit Number 7   Authorization - Number of Visits 9   PT Start Time 0904   PT Stop Time 0945   PT Time Calculation (min) 41 min   Activity Tolerance Patient tolerated treatment well;No increased pain   Behavior During Therapy WFL for tasks assessed/performed      Past Medical History:  Diagnosis Date  . CAD S/P percutaneous coronary angioplasty November 2014   Mid LAD 2.5 mm x 12 mm Promus DES (2.70 mm); proximal OM1 - Promus DES 2.75 mm x 16 mm (2.85 mm)  . Diabetes mellitus type 2 in obese ([place])   . Essential hypertension   . Hyperlipidemia with target LDL less than 70   . Non-Q wave ST elevation myocardial infarction (STEMI) involving left anterior descending (LAD) coronary artery November 2014   PCI to LAD and circumflex  . Obesity (BMI 30-39.9) 05/05/2013    Past Surgical History:  Procedure Laterality Date  . ABDOMINAL HYSTERECTOMY  1990s  . Arthroscopic knee surgery Bilateral 1984, 2002   Arthroscopic  . CARDIAC CATHETERIZATION  November 2014   Mid LAD 95%, proximal OM1 80-90% --> two-vessel PCI  . COLONOSCOPY N/A 12/05/2013   Procedure: COLONOSCOPY;  Surgeon: Beryle Beams, MD;  Location: [facility];  Service: Endoscopy;  Laterality: N/A;  . COLONOSCOPY WITH PROPOFOL N/A 12/11/2014   Procedure: COLONOSCOPY WITH PROPOFOL;  Surgeon: Carol Ada, MD;  Location: [facility];  Service: Endoscopy;  Laterality: N/A;  . ESOPHAGOGASTRODUODENOSCOPY N/A 12/05/2013   Procedure:  ESOPHAGOGASTRODUODENOSCOPY (EGD);  Surgeon: Beryle Beams, MD;  Location: [facility];  Service: Endoscopy;  Laterality: N/A;  . LEFT HEART CATHETERIZATION WITH CORONARY ANGIOGRAM N/A 02/17/2013   Procedure: LEFT HEART CATHETERIZATION WITH CORONARY ANGIOGRAM;  Surgeon: Sanda Klein, MD;  Location: [facility];  Service: Cardiovascular;  Laterality: N/A;  . PERCUTANEOUS CORONARY STENT INTERVENTION (PCI-S)  November 2014   Mid LAD 2.5 mm x 12 mm (2.75 mm) Promus DES; OM1 2.75 mm x 60 mm (2.85 mm) Promus DES  . TRANSTHORACIC ECHOCARDIOGRAM  02/16/2013   EF 40-50%. Moderate concentric LVH. Moderate H. Shea of mid-distal septal and apical region. Grade 1 diastolic dysfunction. Moderate aortic sclerosis without stenosis.    There were no vitals filed for this visit.      Subjective Assessment - 02/05/17 0904    Subjective PT states that she is not having any pain in her Lt hip today.  States that she feels that she is about 30% better since starting therapy   Currently in Pain? No/denies                         [facility] Adult PT Treatment/Exercise - 02/05/17 0001      Knee/Hip Exercises: Stretches   Passive Hamstring Stretch 1 rep;Both   Other Knee/Hip Stretches 3 D hip excursion x 5    Other Knee/Hip Stretches slant board 3x 30"      Knee/Hip Exercises: Standing  Forward Lunges Both;15 reps   Forward Lunges Limitations  no Ue's onto 4" box   Hip Extension Both;10 reps;Knee straight   Extension Limitations forearm on counter with hip extension   Lateral Step Up Both;10 reps;Hand Hold: 1;Step Height: 6"   Lateral Step Up Limitations bilaterally 1 HHA   Forward Step Up Both;10 reps;Hand Hold: 1;Step Height: 6"   Step Down 10 reps;Hand Hold: 1;Both;Step Height: 4"   Rocker Board 2 minutes   SLS Rt: 5  Lt: 13"   Other Standing Knee Exercises sidestepping with GTB 2RT, tandem gait 2RT                  PT Short Term Goals - 02/05/17 0935      PT SHORT TERM  GOAL #1   Title Patient will be compliant with HEP to improve functional mobility and verbal pain ratings.    Time 2   Period Weeks   Status Achieved     PT SHORT TERM GOAL #2   Title Patient will have improved single leg balance to at least 15 seconds each leg to improve safety during ambulation and gait mechanics.    Time 2   Period Weeks   Status On-going           PT Long Term Goals - 02/05/17 0935      PT LONG TERM GOAL #1   Title Patient will have improved functional mobility noted by improved 3MWT to 678 ft at least with improved gait mechanics.    Time 4   Period Weeks   Status On-going     PT LONG TERM GOAL #2   Title Patient will have improved hip strength by 1 MMT grade to improve stance support during ambulation and to decrease pain.    Time 4   Period Weeks   Status On-going     PT LONG TERM GOAL #3   Title Patient will have improved quality of life rating noted by improve FOTO score by 15%.    Time 4   Period Weeks   Status On-going               Plan - 02/05/17 0934    Clinical Impression Statement Treatment focused on proper form for strengthening of hip and knee mm.  Added step downs and increased step height for forward and lateral step ups.  Emphasized the need for pt to work on balance at home.    Rehab Potential Good   PT Frequency 2x / week   PT Duration 4 weeks   PT Treatment/Interventions ADLs/Self Care Home Management;Cryotherapy;Electrical Stimulation;Gait training;Stair training;Functional mobility training;Therapeutic activities;Therapeutic exercise;Balance training;Neuromuscular re-education;Patient/family education;Manual techniques   PT Next Visit Plan Reassess in 2 visits.    PT Home Exercise Plan Eval: bridge, hip abduction/flexion/extension       Patient will benefit from skilled therapeutic intervention in order to improve the following deficits and impairments:  Abnormal gait, Pain, Decreased activity tolerance, Decreased  endurance, Decreased strength, Obesity, Difficulty walking, Decreased balance  Visit Diagnosis: Arthritis of left hip  Muscle weakness (generalized)     Problem List Patient Active Problem List   Diagnosis Date Noted  . Right carotid bruit 10/25/2013  . Diabetes mellitus type 2 in obese ([place])   . Hyperlipidemia with target LDL less than 70   . Orthostatic hypotension 08/28/2013  . Renal insufficiency 08/19/2013  . Obesity (BMI 30-39.9) 05/05/2013  . CAD S/P percutaneous coronary angioplasty -- LAD/OM1 DES 02/17/13 02/18/2013  .  Pulmonary embolism August 2011 (syncope) 02/18/2013  . NSTEMI (non-ST elevated myocardial infarction) ([place]) 02/15/2013  . Essential hypertension 02/15/2013    Rayetta Humphrey, PT CLT [phone] 02/05/2017, 9:49 AM  [place] [address] [place], [place], [address] Phone: [phone]   Fax:  [phone]  Name: MARGARETMARY PRISK MRN: [id number] Date of Birth: 1945/12/30

## 2017-02-07 ENCOUNTER — Encounter (HOSPITAL_COMMUNITY): Payer: Self-pay

## 2017-02-07 ENCOUNTER — Ambulatory Visit (HOSPITAL_COMMUNITY): Payer: Medicare Other

## 2017-02-07 DIAGNOSIS — M1612 Unilateral primary osteoarthritis, left hip: Secondary | ICD-10-CM

## 2017-02-07 DIAGNOSIS — M6281 Muscle weakness (generalized): Secondary | ICD-10-CM

## 2017-02-07 DIAGNOSIS — R269 Unspecified abnormalities of gait and mobility: Secondary | ICD-10-CM | POA: Diagnosis not present

## 2017-02-07 NOTE — Therapy (Signed)
[place], [place], [address] Phone: [phone]   Fax:  [phone]  Physical Therapy Treatment / Re-Evaluation  Patient Details  Name: KENYONNA MICEK MRN: [id number] Date of Birth: 04-Mar-1946 Referring Provider: Arther Abbott MD  Encounter Date: 02/07/2017      PT End of Session - 02/07/17 0939    Visit Number 8   Number of Visits 9   Date for PT Re-Evaluation 02/06/17   Authorization Type [employer]HC Medicare   Authorization - Visit Number 8   Authorization - Number of Visits 9   PT Start Time 3833  patient arrived late   PT Stop Time 0943   PT Time Calculation (min) 28 min   Activity Tolerance Patient tolerated treatment well;No increased pain   Behavior During Therapy WFL for tasks assessed/performed      Past Medical History:  Diagnosis Date  . CAD S/P percutaneous coronary angioplasty November 2014   Mid LAD 2.5 mm x 12 mm Promus DES (2.70 mm); proximal OM1 - Promus DES 2.75 mm x 16 mm (2.85 mm)  . Diabetes mellitus type 2 in obese ([place])   . Essential hypertension   . Hyperlipidemia with target LDL less than 70   . Non-Q wave ST elevation myocardial infarction (STEMI) involving left anterior descending (LAD) coronary artery November 2014   PCI to LAD and circumflex  . Obesity (BMI 30-39.9) 05/05/2013    Past Surgical History:  Procedure Laterality Date  . ABDOMINAL HYSTERECTOMY  1990s  . Arthroscopic knee surgery Bilateral 1984, 2002   Arthroscopic  . CARDIAC CATHETERIZATION  November 2014   Mid LAD 95%, proximal OM1 80-90% --> two-vessel PCI  . COLONOSCOPY N/A 12/05/2013   Procedure: COLONOSCOPY;  Surgeon: Beryle Beams, MD;  Location: [facility];  Service: Endoscopy;  Laterality: N/A;  . COLONOSCOPY WITH PROPOFOL N/A 12/11/2014   Procedure: COLONOSCOPY WITH PROPOFOL;  Surgeon: Carol Ada, MD;  Location: [facility];  Service: Endoscopy;  Laterality: N/A;  . ESOPHAGOGASTRODUODENOSCOPY N/A  12/05/2013   Procedure: ESOPHAGOGASTRODUODENOSCOPY (EGD);  Surgeon: Beryle Beams, MD;  Location: [facility];  Service: Endoscopy;  Laterality: N/A;  . LEFT HEART CATHETERIZATION WITH CORONARY ANGIOGRAM N/A 02/17/2013   Procedure: LEFT HEART CATHETERIZATION WITH CORONARY ANGIOGRAM;  Surgeon: Sanda Klein, MD;  Location: [facility];  Service: Cardiovascular;  Laterality: N/A;  . PERCUTANEOUS CORONARY STENT INTERVENTION (PCI-S)  November 2014   Mid LAD 2.5 mm x 12 mm (2.75 mm) Promus DES; OM1 2.75 mm x 60 mm (2.85 mm) Promus DES  . TRANSTHORACIC ECHOCARDIOGRAM  02/16/2013   EF 40-50%. Moderate concentric LVH. Moderate H. Shea of mid-distal septal and apical region. Grade 1 diastolic dysfunction. Moderate aortic sclerosis without stenosis.    There were no vitals filed for this visit.      Subjective Assessment - 02/07/17 0924    Subjective Pt states she feels she has improved so far and her pain is not as much as it was when she first started coming. She notes that stairs are a little better.    Currently in Pain? No/denies            [facility] PT Assessment - 02/07/17 0001      Balance Screen   Has the patient fallen in the past 6 months No   Has the patient had a decrease in activity level because of a fear of falling?  No   Is the patient reluctant to leave their home because  of a fear of falling?  No     Observation/Other Assessments   Focus on Therapeutic Outcomes (FOTO)  48% limited     Single Leg Stance   Comments Rt. 10 seconds max; Lt. 5 second mas     Strength   Right Hip Flexion 5/5   Right Hip ABduction 4+/5   Left Hip Flexion 4+/5   Left Hip ABduction 4/5   Left Knee Flexion 5/5   Left Knee Extension 5/5     Transfers   Five time sit to stand comments  17 seconds no UE assist    Comments TUG 10 seconds minimal UE assist at knees     Ambulation/Gait   Ambulation Distance (Feet) 588 Feet   Gait Pattern Within Functional Limits                      [facility] Adult PT Treatment/Exercise - 02/07/17 0001      Knee/Hip Exercises: Standing   SLS --   Gait Training tandem on foam 3 x 15 seconds each                PT Education - 02/07/17 1139    Education provided Yes   Education Details Patient encouraged to complete HEP, importance to continue strengthening, benefit of additional 3 visits to improve balance   Person(s) Educated Patient   Methods Explanation   Comprehension Verbalized understanding          PT Short Term Goals - 02/07/17 0923      PT SHORT TERM GOAL #1   Title Patient will be compliant with HEP to improve functional mobility and verbal pain ratings.    Time 2   Period Weeks   Status Achieved     PT SHORT TERM GOAL #2   Title Patient will have improved single leg balance to at least 15 seconds each leg to improve safety during ambulation and gait mechanics.    Baseline Rt. max 10 seconds out of 4 trials; Lt. max 5 seconds out of 4 trials.    Time 2   Period Weeks   Status On-going           PT Long Term Goals - 02/07/17 2694      PT LONG TERM GOAL #1   Title Patient will have improved functional mobility noted by improved 3MWT to 678 ft at least with improved gait mechanics.    Baseline Patient had improved gait speed initially with improve mechanics   Time 4   Period Weeks   Status Partially Met     PT LONG TERM GOAL #2   Title Patient will have improved hip strength by 1 MMT grade to improve stance support during ambulation and to decrease pain.    Baseline Hip abduction improved from 3- to Lt. 4/5, Rt. 4+/5; hip flexion bilaterally improved to 5/5   Time 4   Period Weeks   Status Achieved     PT LONG TERM GOAL #3   Title Patient will have improved quality of life rating noted by improve FOTO score by 15%.    Baseline improved by 22%   Time 4   Period Weeks   Status Achieved               Plan - 02/07/17 1133    Clinical Impression  Statement Today's session include re-evaluation of progress so far. She has made good functional gains and feels she is ready to complete an  extensive home program. Danniella was able to make good functional gains with strength and quality of movement in TUG, STS, and 3MWT outcome measures, however, did not meet the distance requirement on the 3MWT. Balance continues to be biggest challenge, however, she is able to maintain SLS of at least 5 seconds each LE increasing safety during ambulation and stairs. She denied additional visits to work on balance and wishes to discharge after last authorized session. Patient noted she occasionally does HEP but will start completing more often.    Rehab Potential Good   PT Treatment/Interventions ADLs/Self Care Home Management;Cryotherapy;Electrical Stimulation;Gait training;Stair training;Functional mobility training;Therapeutic activities;Therapeutic exercise;Balance training;Neuromuscular re-education;Patient/family education;Manual techniques   PT Next Visit Plan Focus balance primarily next session add to HEP as needed.    PT Home Exercise Plan Eval: bridge, hip abduction/flexion/extension       Patient will benefit from skilled therapeutic intervention in order to improve the following deficits and impairments:  Abnormal gait, Pain, Decreased activity tolerance, Decreased endurance, Decreased strength, Obesity, Difficulty walking, Decreased balance  Visit Diagnosis: Arthritis of left hip  Muscle weakness (generalized)  Abnormality of gait and mobility     Problem List Patient Active Problem List   Diagnosis Date Noted  . Right carotid bruit 10/25/2013  . Diabetes mellitus type 2 in obese ([place])   . Hyperlipidemia with target LDL less than 70   . Orthostatic hypotension 08/28/2013  . Renal insufficiency 08/19/2013  . Obesity (BMI 30-39.9) 05/05/2013  . CAD S/P percutaneous coronary angioplasty -- LAD/OM1 DES 02/17/13 02/18/2013  . Pulmonary embolism  August 2011 (syncope) 02/18/2013  . NSTEMI (non-ST elevated myocardial infarction) ([place]) 02/15/2013  . Essential hypertension 02/15/2013    [place] 02/07/2017, 11:41 AM  [place], [place], [address] Phone: [phone]   Fax:  [phone]  Name: DANIAH ZALDIVAR MRN: [id number] Date of Birth: 06-11-1945

## 2017-02-13 ENCOUNTER — Encounter (HOSPITAL_COMMUNITY): Payer: Self-pay

## 2017-02-13 ENCOUNTER — Ambulatory Visit (HOSPITAL_COMMUNITY): Payer: Medicare Other | Attending: Orthopedic Surgery

## 2017-02-13 DIAGNOSIS — R269 Unspecified abnormalities of gait and mobility: Secondary | ICD-10-CM | POA: Diagnosis not present

## 2017-02-13 DIAGNOSIS — M1612 Unilateral primary osteoarthritis, left hip: Secondary | ICD-10-CM | POA: Diagnosis not present

## 2017-02-13 DIAGNOSIS — M6281 Muscle weakness (generalized): Secondary | ICD-10-CM | POA: Diagnosis not present

## 2017-02-13 NOTE — Therapy (Signed)
[place], [place], [address] Phone: [phone]   Fax:  [phone]  February 13, 2017   _0 @  Physical Therapy Discharge Summary  Patient: Phyllis Conner  MRN: [id number]  Date of Birth: 11/08/1945   Diagnosis: Arthritis of left hip  Muscle weakness (generalized)  Abnormality of gait and mobility Referring Provider: Arther Abbott MD   The above patient had been seen in Physical Therapy 9 times of 9 treatments scheduled with 0 no shows and 0 cancellations.  The treatment consisted of strengthening, balance, gait training, functional mobility training, neuromuscular re-ed and HEP.  The patient is: Improved  Subjective: Patient presents with reports of good functional progression at home and in community. She desires to discontinue and be discharged to her HEP.   Discharge Findings: Patient has made good improvements towards functional goals since initial evaluation.   Functional Status at Discharge: Good progression, unmet goal functional balance >12 seconds.   Goals Partially Met      02/13/17 0001  Knee/Hip Exercises: Standing  Hip Abduction Both;15 reps  Abduction Limitations 1 UE assist // bars  Hip Extension Both;10 reps;Knee straight  Extension Limitations 1 UE assist // bars  SLS 30 seconds x 3 each  Forward Lunges 10 reps;Both  Forward Lunges Limitations UE assist  Functional Squat 10 reps  Functional Squat Limitations front of chair for form (// bar UE assist)  Other Standing Knee Exercises 3D hip excursion on airex x 3 each  Other Standing Knee Exercises sidestepping with GTB 2RT, tandem gait 1 RT, backward 1 RT  Lunge Walking - Round Trips Lateral lunge x 10 each  Rebounder tandem stance 30 sec x 3 reps each  SLS with Vectors 12 seconds bilat max      Plan - 02/13/17 1028    Clinical Impression Statement  Today's session focused on balance and reviewing additional HEP  exercises. Patient continues to have decreased balance with single leg stance, however, more stability with tandem stance. Patient will be discharged today to her home program with encouragement to follow up with MD if symptoms start to return for a new referral. Remaining unmet goal was evaluated today with Single leg stance 12 second each achieved with a goal of 15 seconds to simulate drying off feet or putting on shoes and socks. Overall, patient has made great functional improvement and will continue to do well with HEP.     Rehab Potential  Good    PT Treatment/Interventions  ADLs/Self Care Home Management;Cryotherapy;Electrical Stimulation;Gait training;Stair training;Functional mobility training;Therapeutic activities;Therapeutic exercise;Balance training;Neuromuscular re-education;Patient/family education;Manual techniques    PT Next Visit Plan  Discharge to HEP    PT Home Exercise Plan  Eval: bridge, hip abduction/flexion/extension        Sincerely,  Starr Lake PT, DPT 11:33 AM, 02/13/17 [place], [place], [address] Phone: [phone]   Fax:  [phone]  Patient: Phyllis Conner  MRN: [id number]  Date of Birth: 11-23-1945

## 2017-02-13 NOTE — Patient Instructions (Addendum)
  SQUAT - SUPPORTED WITH CHAIR FOR SAFETY 10 reps Place a chair behind you for safety.   While standing with feet shoulder width apart and in front of a stable support for balance assist if needed, bend your knees and lower your body towards the floor. Your body weight should mostly be directed through the heels of your feet. Return to a standing position.   Knees should bend in line with the 2nd toe and not pass the front of the foot.     ELASTIC BAND LATERAL WALKS - PROXIMAL 15 steps each side  With an elastic band around your thighs, take steps to the side while keeping your feet spread apart. Keep your knees bent the entire time.      FORWARD LUNGE 10 each side  Start by standing with feet shoulder-width-apart. Next, take a step forward and slightly out to the side and allow your front knee to bend. Your back knee may bend as well. Then, return to original position, or you may walk and take a step forward and repeat with the other leg.Keep your pelvis level and straight the entire time. Your front knee should bend in line with the 2nd toe and not pass the front of the foot.     MINI LATERAL LUNGE 10 each side   Step to the side and balance on the leg. Next return to the original position. You knees should be bent about 30 degrees while performing.  Stand in front of an object for balance if needed.     TANDEM STANCE WITH SUPPORT 30 seconds x 3 reps each  Stand in front of a chair, table or counter top for support. Then place the heel of one foot so that it is touching the toes of the other foot. Maintain your balance in this position.     SINGLE LEG STANCE - SLS 30 seconds x 6 reps  Stand on one leg and maintain your balance.

## 2017-03-27 DIAGNOSIS — I259 Chronic ischemic heart disease, unspecified: Secondary | ICD-10-CM | POA: Diagnosis not present

## 2017-03-27 DIAGNOSIS — E118 Type 2 diabetes mellitus with unspecified complications: Secondary | ICD-10-CM | POA: Diagnosis not present

## 2017-03-27 DIAGNOSIS — I119 Hypertensive heart disease without heart failure: Secondary | ICD-10-CM | POA: Diagnosis not present

## 2017-03-27 DIAGNOSIS — M150 Primary generalized (osteo)arthritis: Secondary | ICD-10-CM | POA: Diagnosis not present

## 2017-05-28 DIAGNOSIS — M150 Primary generalized (osteo)arthritis: Secondary | ICD-10-CM | POA: Diagnosis not present

## 2017-05-28 DIAGNOSIS — I119 Hypertensive heart disease without heart failure: Secondary | ICD-10-CM | POA: Diagnosis not present

## 2017-05-28 DIAGNOSIS — G479 Sleep disorder, unspecified: Secondary | ICD-10-CM | POA: Diagnosis not present

## 2017-05-28 DIAGNOSIS — E118 Type 2 diabetes mellitus with unspecified complications: Secondary | ICD-10-CM | POA: Diagnosis not present

## 2017-05-28 DIAGNOSIS — I10 Essential (primary) hypertension: Secondary | ICD-10-CM | POA: Diagnosis not present

## 2017-05-28 DIAGNOSIS — E1300 Other specified diabetes mellitus with hyperosmolarity without nonketotic hyperglycemic-hyperosmolar coma (NKHHC): Secondary | ICD-10-CM | POA: Diagnosis not present

## 2017-06-18 ENCOUNTER — Other Ambulatory Visit: Payer: Self-pay | Admitting: Orthopedic Surgery

## 2017-06-27 DIAGNOSIS — I10 Essential (primary) hypertension: Secondary | ICD-10-CM | POA: Diagnosis not present

## 2017-06-27 DIAGNOSIS — I119 Hypertensive heart disease without heart failure: Secondary | ICD-10-CM | POA: Diagnosis not present

## 2017-06-27 DIAGNOSIS — E1165 Type 2 diabetes mellitus with hyperglycemia: Secondary | ICD-10-CM | POA: Diagnosis not present

## 2017-06-27 DIAGNOSIS — E1100 Type 2 diabetes mellitus with hyperosmolarity without nonketotic hyperglycemic-hyperosmolar coma (NKHHC): Secondary | ICD-10-CM | POA: Diagnosis not present

## 2017-07-05 ENCOUNTER — Other Ambulatory Visit (HOSPITAL_COMMUNITY): Payer: Self-pay | Admitting: Family Medicine

## 2017-07-05 DIAGNOSIS — Z1231 Encounter for screening mammogram for malignant neoplasm of breast: Secondary | ICD-10-CM

## 2017-07-30 ENCOUNTER — Encounter (HOSPITAL_COMMUNITY): Payer: Self-pay

## 2017-07-30 ENCOUNTER — Ambulatory Visit (HOSPITAL_COMMUNITY)
Admission: RE | Admit: 2017-07-30 | Discharge: 2017-07-30 | Disposition: A | Discharge status: Home / Self Care | Payer: Medicare Other | Source: Ambulatory Visit | Attending: Family Medicine | Admitting: Family Medicine

## 2017-07-30 DIAGNOSIS — Z1231 Encounter for screening mammogram for malignant neoplasm of breast: Secondary | ICD-10-CM | POA: Insufficient documentation

## 2017-08-10 DIAGNOSIS — E1100 Type 2 diabetes mellitus with hyperosmolarity without nonketotic hyperglycemic-hyperosmolar coma (NKHHC): Secondary | ICD-10-CM | POA: Diagnosis not present

## 2017-08-10 DIAGNOSIS — M150 Primary generalized (osteo)arthritis: Secondary | ICD-10-CM | POA: Diagnosis not present

## 2017-08-10 DIAGNOSIS — I119 Hypertensive heart disease without heart failure: Secondary | ICD-10-CM | POA: Diagnosis not present

## 2017-08-22 DIAGNOSIS — M1612 Unilateral primary osteoarthritis, left hip: Secondary | ICD-10-CM | POA: Diagnosis not present

## 2017-09-05 ENCOUNTER — Telehealth: Payer: Self-pay | Admitting: *Deleted

## 2017-09-05 NOTE — Telephone Encounter (Signed)
   [facility] Pre-operative Risk Assessment    Request for surgical clearance:  1. What type of surgery is being performed? TOTAL HIP REPLACEMENT   2. When is this surgery scheduled? TBD   3. What type of clearance is required (medical clearance vs. Pharmacy clearance to hold med vs. Both)? BOTH  4. Are there any medications that need to be held prior to surgery and how long?    5. Practice name and name of physician performing surgery? [facility]   6. What is your office phone number [phone]     7.   What is your office fax number  [phone] [place]   8.   Anesthesia type (None, local, MAC,  general) ? SPINAL

## 2017-09-06 NOTE — Telephone Encounter (Signed)
Spoke with pt re: cardiac clearance. She has been scheduled to see Dr. Ellyn Hack, Monday, 09/10/17. Pt thanked me for the call.

## 2017-09-06 NOTE — Telephone Encounter (Signed)
   Primary Cardiologist:David Ellyn Hack, MD (Last seen 10/2016)  Chart reviewed as part of pre-operative protocol coverage. Because of Wenda S Novitski's past medical history and time since last visit, he/she will require a follow-up visit in order to better assess preoperative cardiovascular risk.  Patient has appointment with Dr. Ellyn Hack 10/24/2017.   Pre-op covering staff: - Please contact requesting surgeon's office and/or patient via preferred method (i.e, phone, fax) to see if ok to provide clearance during office visit with Dr. Ellyn Hack in 6 weeks? If not, needs sooner appointment.   [place], [place]  09/06/2017, 2:32 PM

## 2017-09-08 HISTORY — PX: TRANSTHORACIC ECHOCARDIOGRAM: SHX275

## 2017-09-10 ENCOUNTER — Ambulatory Visit (INDEPENDENT_AMBULATORY_CARE_PROVIDER_SITE_OTHER): Payer: Medicare Other | Admitting: Cardiology

## 2017-09-10 ENCOUNTER — Encounter: Payer: Self-pay | Admitting: Cardiology

## 2017-09-10 VITALS — BP 146/88 | HR 96 | Ht 64.0 in | Wt 233.0 lb

## 2017-09-10 DIAGNOSIS — I214 Non-ST elevation (NSTEMI) myocardial infarction: Secondary | ICD-10-CM

## 2017-09-10 DIAGNOSIS — Z9861 Coronary angioplasty status: Secondary | ICD-10-CM | POA: Diagnosis not present

## 2017-09-10 DIAGNOSIS — R0601 Orthopnea: Secondary | ICD-10-CM

## 2017-09-10 DIAGNOSIS — I10 Essential (primary) hypertension: Secondary | ICD-10-CM

## 2017-09-10 DIAGNOSIS — I2510 Atherosclerotic heart disease of native coronary artery without angina pectoris: Secondary | ICD-10-CM | POA: Diagnosis not present

## 2017-09-10 DIAGNOSIS — E669 Obesity, unspecified: Secondary | ICD-10-CM | POA: Diagnosis not present

## 2017-09-10 DIAGNOSIS — Z01810 Encounter for preprocedural cardiovascular examination: Secondary | ICD-10-CM | POA: Diagnosis not present

## 2017-09-10 DIAGNOSIS — E785 Hyperlipidemia, unspecified: Secondary | ICD-10-CM | POA: Diagnosis not present

## 2017-09-10 DIAGNOSIS — E1169 Type 2 diabetes mellitus with other specified complication: Secondary | ICD-10-CM

## 2017-09-10 NOTE — Progress Notes (Signed)
PCP: Lucianne Lei, MD  Clinic Note: Chief Complaint  Patient presents with  . Follow-up    Notes some shortness of breath  . Coronary Artery Disease  . Pre-op Exam    HPI: Phyllis Conner is a 72 y.o. female with a PMH below who presents today for ~ annual f/u for CAD-PCI in setting of NSTEMI 02/2013 -- being seen early for Pre-op L Hip Sgx evaluation  Phyllis Conner was last seen on October 24, 2016 - She was doing well without any major concerns.  Recent Hospitalizations:   None (ER visit with Hip pain)  Studies Personally Reviewed - (if available, images/films reviewed: From Epic Chart or Care Everywhere)  None   Interval History: He is here a little bit early for her annual follow-up because of preoperative evaluation for left hip surgery.  She really is limited as far as any walking with her hip pain and now she is having knee pain as well in the right side.  She is having a hard time doing the exercise walking that she had been doing before.  She is still trying to walk however and is probably limited more by the pain then dyspnea -- .  She has some mild edema, occasional PND/Orthopnea -but really denies resting dyspnea.  She is put on more weight because of lack of exercise and that may be contributing to his her dyspnea. She is not having any resting or exertional angina.  She is also having balance issues and having to use a cane no palpitations, lightheadedness, dizziness, weakness or syncope/near syncope. No TIA/amaurosis fugax symptoms. No claudication.  ROS: A comprehensive was performed. Review of Systems  Constitutional: Negative for weight loss (Actually gained weight because of lack of exercise.).  HENT: Negative for nosebleeds.   Respiratory: Positive for shortness of breath (At baseline, but no change). Negative for wheezing.   Cardiovascular: Negative for claudication (Bilateral leg pain off and on difficult to distinguish between claudication and joint pain.   ).  Gastrointestinal: Negative for blood in stool and melena.  Genitourinary: Negative for hematuria.  Neurological: Positive for dizziness (Sometimes positional, but not routine).  Endo/Heme/Allergies: Does not bruise/bleed easily.  Psychiatric/Behavioral: Negative.   All other systems reviewed and are negative.  Weight gain, L hip & Bknee pain  I have reviewed and (if needed) personally updated the patient's problem list, medications, allergies, past medical and surgical history, social and family history.   Past Medical History:  Diagnosis Date  . CAD S/P percutaneous coronary angioplasty November 2014   Mid LAD 2.5 mm x 12 mm Promus DES (2.70 mm); proximal OM1 - Promus DES 2.75 mm x 16 mm (2.85 mm)  . Diabetes mellitus type 2 in obese ([place])   . Essential hypertension   . Hyperlipidemia with target LDL less than 70   . Non-Q wave ST elevation myocardial infarction (STEMI) involving left anterior descending (LAD) coronary artery November 2014   PCI to LAD and circumflex  . Obesity (BMI 30-39.9) 05/05/2013    Past Surgical History:  Procedure Laterality Date  . ABDOMINAL HYSTERECTOMY  1990s  . Arthroscopic knee surgery Bilateral 1984, 2002   Arthroscopic  . CARDIAC CATHETERIZATION  November 2014   Mid LAD 95%, proximal OM1 80-90% --> two-vessel PCI  . COLONOSCOPY N/A 12/05/2013   Procedure: COLONOSCOPY;  Surgeon: Beryle Beams, MD;  Location: [facility] END[facility];  Service: Endoscopy;  Laterality: N/A;  . COLONOSCOPY WITH PROPOFOL N/A 12/11/2014   Procedure: COLONOSCOPY WITH  PROPOFOL;  Surgeon: Carol Ada, MD;  Location: [facility] ENDOSCOPY;  Service: Endoscopy;  Laterality: N/A;  . ESOPHAGOGASTRODUODENOSCOPY N/A 12/05/2013   Procedure: ESOPHAGOGASTRODUODENOSCOPY (EGD);  Surgeon: Beryle Beams, MD;  Location: [facility];  Service: Endoscopy;  Laterality: N/A;  . LEFT HEART CATHETERIZATION WITH CORONARY ANGIOGRAM N/A 02/17/2013   Procedure: LEFT HEART CATHETERIZATION WITH CORONARY ANGIOGRAM;   Surgeon: Sanda Klein, MD;  Location: [facility];  Service: Cardiovascular;  Laterality: N/A;  . PERCUTANEOUS CORONARY STENT INTERVENTION (PCI-S)  November 2014   Mid LAD 2.5 mm x 12 mm (2.75 mm) Promus DES; OM1 2.75 mm x 60 mm (2.85 mm) Promus DES  . TRANSTHORACIC ECHOCARDIOGRAM  02/16/2013   EF 40-50%. Moderate concentric LVH. Moderate H. Shea of mid-distal septal and apical region. Grade 1 diastolic dysfunction. Moderate aortic sclerosis without stenosis.    Current Meds  Medication Sig  . amLODipine (NORVASC) 5 MG tablet TAKE 1 TABLET BY MOUTH DAILY  . atorvastatin (LIPITOR) 80 MG tablet TAKE 1 TABLET BY MOUTH DAILY AT 6 PM  . BRILINTA 90 MG TABS tablet TK 1 T PO BID  . HUMALOG 100 UNIT/ML injection 1 application daily.  . Insulin Disposable Pump (V-GO 20) KIT Inject 10 Units into the skin every morning. Humalog  . isosorbide mononitrate (IMDUR) 30 MG 24 hr tablet TAKE 1 TABLET BY MOUTH EVERY DAY  . metFORMIN (GLUCOPHAGE) 500 MG tablet Take 1 tablet (500 mg total) by mouth daily.  . metoprolol tartrate (LOPRESSOR) 25 MG tablet Take 1 tablet (25 mg total) by mouth 2 (two) times daily.  . nitroGLYCERIN (NITROSTAT) 0.4 MG SL tablet Place 1 tablet (0.4 mg total) under the tongue every 5 (five) minutes x 3 doses as needed for chest pain.  . potassium chloride SA (K-DUR,KLOR-CON) 20 MEQ tablet Take 1 tablet by mouth every morning.     Allergies  Allergen Reactions  . Iohexol Hives and Other (See Comments)    Excessive sweating    Social History   Tobacco Use  . Smoking status: Never Smoker  . Smokeless tobacco: Never Used  Substance Use Topics  . Alcohol use: No  . Drug use: No   Social History   Social History Narrative   She is married with 4 children.   She does not smoke and does not drink alcohol.    family history includes Asthma in her mother; Cancer in her father; Heart attack in her mother; Heart disease in her brother, mother, and sister.  Wt Readings from Last  3 Encounters:  09/10/17 233 lb (105.7 kg)  12/25/16 232 lb (105.2 kg)  12/14/16 232 lb (105.2 kg)  -- lost 5 lb since seeing PCP.    PHYSICAL EXAM BP (!) 146/88   Pulse 96   Ht 5' 4" (1.626 m)   Wt 233 lb (105.7 kg)   BMI 39.99 kg/m  Physical Exam  Constitutional: She is oriented to person, place, and time.  Morbidly obese but otherwise healthy appearing and well-groomed.  No acute distress.  HENT:  Head: Normocephalic and atraumatic.  Neck: Normal range of motion. Neck supple. No hepatojugular reflux (Cannot really assess) and no JVD present. Carotid bruit is present (Soft right-sided bruit versus radiated aortic murmur).  Cardiovascular: Normal rate, regular rhythm and intact distal pulses.  No extrasystoles are present. PMI is not displaced (Unable to palpate). Exam reveals distant heart sounds. Exam reveals no gallop and no friction rub.  Murmur heard. High-pitched harsh crescendo-decrescendo midsystolic murmur is present with a  grade of 2/6 at the upper right sternal border radiating to the neck. Pulmonary/Chest: Effort normal and breath sounds normal. No respiratory distress. She has no wheezes. She has no rales.  Abdominal: Soft. Bowel sounds are normal. She exhibits no distension. There is no tenderness. There is no rebound.  Obese.  Unable to palpate HSM  Musculoskeletal: Normal range of motion. She exhibits edema (Trivial).  Neurological: She is alert and oriented to person, place, and time.  Psychiatric: She has a normal mood and affect. Her behavior is normal. Judgment and thought content normal.     Adult ECG Report  Rate: 76 ;  Rhythm: normal sinus rhythm and Nonspecific ST and T wave changes.;   Narrative Interpretation: Stable EKG   Other studies Reviewed: Additional studies/ records that were reviewed today include:  Recent Labs: Labs from Aug 11, 2017-TC 223, TG 127, HDL 63, LDL 135.  A1c 7.5 with glucose of 163.  BUN/creatinine 18/1.0.      ASSESSMENT /  PLAN: Problem List Items Addressed This Visit    Preop cardiovascular exam - Primary    Phyllis Conner has relatively stable nonactive CAD that is well controlled.  She has some dyspnea but could easily be explained by her body habitus and the discomfort with walking.  Since she is having orthopnea however we will check an echocardiogram just to make sure her EF is stable and get a better assessment of pulmonary pressures preoperatively. With no history of stroke, normal renal function, and a low risk surgery without active heart failure or angina symptoms, she will be a low risk patient for a low risk surgery.  She is on moderate dose beta-blocker which provides cardioprotection.  Reducing from roughly 1-3% down to closer to 1% risk.  I would not recommend further evaluation preoperatively unless her echocardiogram shows a dramatic difference with a new regional wall motion normality.      Relevant Orders   EKG 12-Lead (Completed)   ECHOCARDIOGRAM COMPLETE   Orthopnea    I am not sure if this is related to her obesity or may be some diastolic dysfunction. With planned surgery coming up I would like to just reassess her EF, since it has not been on a while.      Relevant Orders   ECHOCARDIOGRAM COMPLETE   Obesity (BMI 30-39.9) (Chronic)    Hip pain is limiting her from being on doing walking, she is no longer losing weight.  She could also probably benefit from dietitian counseling (perhaps once she is in the hospital for hip surgery)      NSTEMI (non-ST elevated myocardial infarction) ([place]) (Chronic)    No recurrent angina symptoms.  She does have some dyspnea, but this is easily explainable by her body habitus significant amount of hip pain with walking.  Unable to exercise and therefore deconditioned.  Needs to have her hip surgery done.      Relevant Orders   EKG 12-Lead (Completed)   ECHOCARDIOGRAM COMPLETE   Hyperlipidemia with target LDL less than 70 (Chronic)    Remains on high-dose  atorvastatin With very poorly controlled LDL.  She clearly needs to adjust her diet and we discussed this.  I will discuss referral to CV RR ([facility]) when I see her back in follow-up in July postoperatively.       Essential hypertension (Chronic)    Blood pressure still little bit elevated today with a heart rate of 96 bpm.  Probably related to pain, but  I would like to take this opportunity to titrate up her metoprolol to 50 mg twice daily.  This provides --perioperative risk reduction.      Diabetes mellitus type 2 in obese [facility])   CAD S/P percutaneous coronary angioplasty -- LAD/OM1 DES 02/17/13 (Chronic)    Stable without any active angina symptoms.  She remains on Brilinta which we can reduce to 60 mg twice daily.   This CAN  be held for surgery.   Would recommend holding 7 days preop and then restart 2 to 3 days postop. Continue beta-blocker and calcium channel blocker along with statin. Continue low-dose Imdur for antianginal effect.  Not on aspirin because she is on Brilinta.      Relevant Orders   EKG 12-Lead (Completed)   ECHOCARDIOGRAM COMPLETE        Current medicines are reviewed at length with the patient today.  (+/- concerns) n/a The following changes have been made:  Consider increasing metoprolol up to 50 twice daily  Patient Instructions  NO MEDICATION CHANGES    Schedule at [facility] has requested that you have an echocardiogram. Echocardiography is a painless test that uses sound waves to create images of your heart. It provides your doctor with information about the size and shape of your heart and how well your heart's chambers and valves are working. This procedure takes approximately one hour. There are no restrictions for this procedure.   IF  TEST IS NORMAL - CARDIAC CLEARANCE FOR  LEFT HIP SURGERY    KEEP APPOINTMENT FOR July 2019 WITH DR HARDING.      Studies  Ordered:   Orders Placed This Encounter  Procedures  . EKG 12-Lead  . ECHOCARDIOGRAM COMPLETE      Glenetta Hew, M.D., M.S. Interventional Cardiologist   Pager # [phone] Phone # [phone] [address] [place], [place] [address]   Thank you for choosing Heartcare at [facility]!!

## 2017-09-10 NOTE — Patient Instructions (Addendum)
NO MEDICATION CHANGES    Schedule at [facility] has requested that you have an echocardiogram. Echocardiography is a painless test that uses sound waves to create images of your heart. It provides your doctor with information about the size and shape of your heart and how well your heart's chambers and valves are working. This procedure takes approximately one hour. There are no restrictions for this procedure.   IF  TEST IS NORMAL - CARDIAC CLEARANCE FOR  LEFT HIP SURGERY    KEEP APPOINTMENT FOR July 2019 WITH DR HARDING.

## 2017-09-11 ENCOUNTER — Encounter: Payer: Self-pay | Admitting: Cardiology

## 2017-09-11 NOTE — Assessment & Plan Note (Signed)
Blood pressure still little bit elevated today with a heart rate of 96 bpm.  Probably related to pain, but I would like to take this opportunity to titrate up her metoprolol to 50 mg twice daily.  This provides --perioperative risk reduction.

## 2017-09-11 NOTE — Assessment & Plan Note (Signed)
No recurrent angina symptoms.  She does have some dyspnea, but this is easily explainable by her body habitus significant amount of hip pain with walking.  Unable to exercise and therefore deconditioned.  Needs to have her hip surgery done.

## 2017-09-11 NOTE — Assessment & Plan Note (Signed)
I am not sure if this is related to her obesity or may be some diastolic dysfunction. With planned surgery coming up I would like to just reassess her EF, since it has not been on a while.

## 2017-09-11 NOTE — Assessment & Plan Note (Signed)
Phyllis Conner has relatively stable nonactive CAD that is well controlled.  She has some dyspnea but could easily be explained by her body habitus and the discomfort with walking.  Since she is having orthopnea however we will check an echocardiogram just to make sure her EF is stable and get a better assessment of pulmonary pressures preoperatively. With no history of stroke, normal renal function, and a low risk surgery without active heart failure or angina symptoms, she will be a low risk patient for a low risk surgery.  She is on moderate dose beta-blocker which provides cardioprotection.  Reducing from roughly 1-3% down to closer to 1% risk.  I would not recommend further evaluation preoperatively unless her echocardiogram shows a dramatic difference with a new regional wall motion normality.

## 2017-09-11 NOTE — Assessment & Plan Note (Signed)
Hip pain is limiting her from being on doing walking, she is no longer losing weight.  She could also probably benefit from dietitian counseling (perhaps once she is in the hospital for hip surgery)

## 2017-09-11 NOTE — Assessment & Plan Note (Addendum)
Stable without any active angina symptoms.  She remains on Brilinta which we can reduce to 60 mg twice daily.   This CAN  be held for surgery.   Would recommend holding 7 days preop and then restart 2 to 3 days postop. Continue beta-blocker and calcium channel blocker along with statin. Continue low-dose Imdur for antianginal effect.  Not on aspirin because she is on Brilinta.

## 2017-09-11 NOTE — Assessment & Plan Note (Addendum)
Remains on high-dose atorvastatin With very poorly controlled LDL.  She clearly needs to adjust her diet and we discussed this.  I will discuss referral to CV RR (Cardiovascular Risk [facility]) when I see her back in follow-up in July postoperatively.

## 2017-09-14 ENCOUNTER — Ambulatory Visit (HOSPITAL_COMMUNITY): Payer: Medicare Other | Attending: Internal Medicine

## 2017-09-14 ENCOUNTER — Other Ambulatory Visit: Payer: Self-pay

## 2017-09-14 DIAGNOSIS — R42 Dizziness and giddiness: Secondary | ICD-10-CM | POA: Insufficient documentation

## 2017-09-14 DIAGNOSIS — I2510 Atherosclerotic heart disease of native coronary artery without angina pectoris: Secondary | ICD-10-CM

## 2017-09-14 DIAGNOSIS — Z01810 Encounter for preprocedural cardiovascular examination: Secondary | ICD-10-CM | POA: Diagnosis not present

## 2017-09-14 DIAGNOSIS — Z9861 Coronary angioplasty status: Secondary | ICD-10-CM | POA: Diagnosis not present

## 2017-09-14 DIAGNOSIS — E119 Type 2 diabetes mellitus without complications: Secondary | ICD-10-CM | POA: Insufficient documentation

## 2017-09-14 DIAGNOSIS — R011 Cardiac murmur, unspecified: Secondary | ICD-10-CM | POA: Diagnosis not present

## 2017-09-14 DIAGNOSIS — I219 Acute myocardial infarction, unspecified: Secondary | ICD-10-CM | POA: Insufficient documentation

## 2017-09-14 DIAGNOSIS — I214 Non-ST elevation (NSTEMI) myocardial infarction: Secondary | ICD-10-CM | POA: Diagnosis not present

## 2017-09-14 DIAGNOSIS — I350 Nonrheumatic aortic (valve) stenosis: Secondary | ICD-10-CM | POA: Insufficient documentation

## 2017-09-14 DIAGNOSIS — R0601 Orthopnea: Secondary | ICD-10-CM

## 2017-09-14 DIAGNOSIS — I119 Hypertensive heart disease without heart failure: Secondary | ICD-10-CM | POA: Diagnosis not present

## 2017-09-14 DIAGNOSIS — R600 Localized edema: Secondary | ICD-10-CM | POA: Diagnosis not present

## 2017-09-21 DIAGNOSIS — Z0000 Encounter for general adult medical examination without abnormal findings: Secondary | ICD-10-CM | POA: Diagnosis not present

## 2017-10-02 ENCOUNTER — Other Ambulatory Visit: Payer: Self-pay | Admitting: Orthopedic Surgery

## 2017-10-08 ENCOUNTER — Other Ambulatory Visit: Payer: Self-pay

## 2017-10-08 ENCOUNTER — Encounter (HOSPITAL_COMMUNITY): Payer: Self-pay

## 2017-10-08 ENCOUNTER — Encounter (HOSPITAL_COMMUNITY)
Admission: RE | Admit: 2017-10-08 | Discharge: 2017-10-08 | Disposition: A | Discharge status: Home / Self Care | Payer: Medicare Other | Source: Ambulatory Visit | Attending: Orthopedic Surgery | Admitting: Orthopedic Surgery

## 2017-10-08 ENCOUNTER — Ambulatory Visit (HOSPITAL_COMMUNITY)
Admission: RE | Admit: 2017-10-08 | Discharge: 2017-10-08 | Disposition: A | Discharge status: Home / Self Care | Payer: Medicare Other | Source: Ambulatory Visit | Attending: Orthopedic Surgery | Admitting: Orthopedic Surgery

## 2017-10-08 DIAGNOSIS — Z01818 Encounter for other preprocedural examination: Secondary | ICD-10-CM | POA: Insufficient documentation

## 2017-10-08 DIAGNOSIS — E785 Hyperlipidemia, unspecified: Secondary | ICD-10-CM | POA: Diagnosis not present

## 2017-10-08 DIAGNOSIS — E119 Type 2 diabetes mellitus without complications: Secondary | ICD-10-CM | POA: Diagnosis not present

## 2017-10-08 DIAGNOSIS — Z794 Long term (current) use of insulin: Secondary | ICD-10-CM | POA: Diagnosis not present

## 2017-10-08 DIAGNOSIS — M1612 Unilateral primary osteoarthritis, left hip: Secondary | ICD-10-CM | POA: Diagnosis not present

## 2017-10-08 DIAGNOSIS — I252 Old myocardial infarction: Secondary | ICD-10-CM | POA: Insufficient documentation

## 2017-10-08 DIAGNOSIS — I10 Essential (primary) hypertension: Secondary | ICD-10-CM | POA: Insufficient documentation

## 2017-10-08 DIAGNOSIS — I2510 Atherosclerotic heart disease of native coronary artery without angina pectoris: Secondary | ICD-10-CM | POA: Insufficient documentation

## 2017-10-08 DIAGNOSIS — Z79899 Other long term (current) drug therapy: Secondary | ICD-10-CM | POA: Diagnosis not present

## 2017-10-08 DIAGNOSIS — Z01812 Encounter for preprocedural laboratory examination: Secondary | ICD-10-CM | POA: Diagnosis not present

## 2017-10-08 HISTORY — DX: Anemia, unspecified: D64.9

## 2017-10-08 HISTORY — DX: Gastro-esophageal reflux disease without esophagitis: K21.9

## 2017-10-08 HISTORY — DX: Headache, unspecified: R51.9

## 2017-10-08 HISTORY — DX: Headache: R51

## 2017-10-08 HISTORY — DX: Nonrheumatic aortic (valve) stenosis: I35.0

## 2017-10-08 LAB — CBC WITH DIFFERENTIAL/PLATELET
ABS IMMATURE GRANULOCYTES: 0 10*3/uL (ref 0.0–0.1)
BASOS ABS: 0 10*3/uL (ref 0.0–0.1)
BASOS PCT: 0 %
Eosinophils Absolute: 0.1 10*3/uL (ref 0.0–0.7)
Eosinophils Relative: 1 %
HCT: 35.9 % — ABNORMAL LOW (ref 36.0–46.0)
Hemoglobin: 11 g/dL — ABNORMAL LOW (ref 12.0–15.0)
Immature Granulocytes: 1 %
Lymphocytes Relative: 20 %
Lymphs Abs: 1.6 10*3/uL (ref 0.7–4.0)
MCH: 29.1 pg (ref 26.0–34.0)
MCHC: 30.6 g/dL (ref 30.0–36.0)
MCV: 95 fL (ref 78.0–100.0)
Monocytes Absolute: 0.5 10*3/uL (ref 0.1–1.0)
Monocytes Relative: 6 %
NEUTROS ABS: 5.5 10*3/uL (ref 1.7–7.7)
Neutrophils Relative %: 72 %
PLATELETS: 210 10*3/uL (ref 150–400)
RBC: 3.78 MIL/uL — ABNORMAL LOW (ref 3.87–5.11)
RDW: 13.8 % (ref 11.5–15.5)
WBC: 7.7 10*3/uL (ref 4.0–10.5)

## 2017-10-08 LAB — SURGICAL PCR SCREEN
MRSA, PCR: NEGATIVE
STAPHYLOCOCCUS AUREUS: NEGATIVE

## 2017-10-08 LAB — BASIC METABOLIC PANEL
ANION GAP: 12 (ref 5–15)
BUN: 13 mg/dL (ref 8–23)
CALCIUM: 9.3 mg/dL (ref 8.9–10.3)
CO2: 21 mmol/L — ABNORMAL LOW (ref 22–32)
Chloride: 110 mmol/L (ref 98–111)
Creatinine, Ser: 1.06 mg/dL — ABNORMAL HIGH (ref 0.44–1.00)
GFR, EST NON AFRICAN AMERICAN: 52 mL/min — AB (ref >60)
GLUCOSE: 191 mg/dL — AB (ref 70–99)
Potassium: 3.4 mmol/L — ABNORMAL LOW (ref 3.5–5.1)
SODIUM: 143 mmol/L (ref 135–145)

## 2017-10-08 LAB — URINALYSIS, ROUTINE W REFLEX MICROSCOPIC
BILIRUBIN URINE: NEGATIVE
Glucose, UA: NEGATIVE mg/dL
HGB URINE DIPSTICK: NEGATIVE
Ketones, ur: NEGATIVE mg/dL
NITRITE: NEGATIVE
PH: 5 (ref 5.0–8.0)
Protein, ur: 30 mg/dL — AB
Specific Gravity, Urine: 1.017 (ref 1.005–1.030)

## 2017-10-08 LAB — APTT: APTT: 28 s (ref 24–36)

## 2017-10-08 LAB — TYPE AND SCREEN
ABO/RH(D): A POS
ANTIBODY SCREEN: NEGATIVE

## 2017-10-08 LAB — PROTIME-INR
INR: 1.09
PROTHROMBIN TIME: 14 s (ref 11.4–15.2)

## 2017-10-08 LAB — ABO/RH: ABO/RH(D): A POS

## 2017-10-08 LAB — HEMOGLOBIN A1C
Hgb A1c MFr Bld: 7.8 % — ABNORMAL HIGH (ref 4.8–5.6)
Mean Plasma Glucose: 177.16 mg/dL

## 2017-10-08 LAB — GLUCOSE, CAPILLARY: Glucose-Capillary: 177 mg/dL — ABNORMAL HIGH (ref 70–99)

## 2017-10-08 NOTE — Pre-Procedure Instructions (Signed)
Phyllis Conner  10/08/2017      [facility] - [place], [place] - [address] AT [address] [place] [address] Phone: [phone] Fax: [phone]    Your procedure is scheduled on Wednesday July 10.  Report to [facility] Admitting at 10:45 A.M.  Call this number if you have problems the morning of surgery:  [phone]   Remember:  Do not eat or drink after midnight.    Take these medicines the morning of surgery with A SIP OF WATER:   Metoprolol (lopressor) Amlodipine (norvasc) Isosorbide (imdur) Acetaminophen (tylenol) if needed  DO NOT TAKE Metformin (glucophage) the day of surgery  Insulin Pump: Reduce basal rate by 20% at midnight the night before surgery OR contact the doctor who manages your insulin pump for instructions regarding surgery  7 days prior to surgery STOP taking any Aspirin(unless otherwise instructed by your surgeon), Aleve, Naproxen, Ibuprofen, Motrin, Advil, Goody's, BC's, all herbal medications, fish oil, and all vitamins  **Follow your surgeon's instructions on stopping Brilinta (Ticagrelor)**     How to Manage Your Diabetes Before and After Surgery  Why is it important to control my blood sugar before and after surgery? . Improving blood sugar levels before and after surgery helps healing and can limit problems. . A way of improving blood sugar control is eating a healthy diet by: o  Eating less sugar and carbohydrates o  Increasing activity/exercise o  Talking with your doctor about reaching your blood sugar goals . High blood sugars (greater than 180 mg/dL) can raise your risk of infections and slow your recovery, so you will need to focus on controlling your diabetes during the weeks before surgery. . Make sure that the doctor who takes care of your diabetes knows about your planned surgery including the date and location.  How do I manage my blood sugar  before surgery? . Check your blood sugar at least 4 times a day, starting 2 days before surgery, to make sure that the level is not too high or low. o Check your blood sugar the morning of your surgery when you wake up and every 2 hours until you get to the Short Stay unit. . If your blood sugar is less than 70 mg/dL, you will need to treat for low blood sugar: o Do not take insulin. o Treat a low blood sugar (less than 70 mg/dL) with  cup of clear juice (cranberry or apple), 4 glucose tablets, OR glucose gel. Recheck blood sugar in 15 minutes after treatment (to make sure it is greater than 70 mg/dL). If your blood sugar is not greater than 70 mg/dL on recheck, call [phone] o  for further instructions. . Report your blood sugar to the short stay nurse when you get to Short Stay.  . If you are admitted to the hospital after surgery: o Your blood sugar will be checked by the staff and you will probably be given insulin after surgery (instead of oral diabetes medicines) to make sure you have good blood sugar levels. o The goal for blood sugar control after surgery is 80-180 mg/dL.              Do not wear jewelry, make-up or nail polish.  Do not wear lotions, powders, or perfumes, or deodorant.  Do not shave 48 hours prior to surgery.  Men may shave face and neck.  Do  not bring valuables to the hospital.  [facility] is not responsible for any belongings or valuables.  Contacts, dentures or bridgework may not be worn into surgery.  Leave your suitcase in the car.  After surgery it may be brought to your room.  For patients admitted to the hospital, discharge time will be determined by your treatment team.  Patients discharged the day of surgery will not be allowed to drive home. Special instructions:     [place]- Preparing For Surgery  Before surgery, you can play an important role. Because skin is not sterile, your skin needs to be as free of germs as possible. You can  reduce the number of germs on your skin by washing with CHG (chlorahexidine gluconate) Soap before surgery.  CHG is an antiseptic cleaner which kills germs and bonds with the skin to continue killing germs even after washing.    Oral Hygiene is also important to reduce your risk of infection.  Remember - BRUSH YOUR TEETH THE MORNING OF SURGERY WITH YOUR REGULAR TOOTHPASTE  Please do not use if you have an allergy to CHG or antibacterial soaps. If your skin becomes reddened/irritated stop using the CHG.  Do not shave (including legs and underarms) for at least 48 hours prior to first CHG shower. It is OK to shave your face.  Please follow these instructions carefully.   1. Shower the NIGHT BEFORE SURGERY and the MORNING OF SURGERY with CHG.   2. If you chose to wash your hair, wash your hair first as usual with your normal shampoo.  3. After you shampoo, rinse your hair and body thoroughly to remove the shampoo.  4. Use CHG as you would any other liquid soap. You can apply CHG directly to the skin and wash gently with a scrungie or a clean washcloth.   5. Apply the CHG Soap to your body ONLY FROM THE NECK DOWN.  Do not use on open wounds or open sores. Avoid contact with your eyes, ears, mouth and genitals (private parts). Wash Face and genitals (private parts)  with your normal soap.  6. Wash thoroughly, paying special attention to the area where your surgery will be performed.  7. Thoroughly rinse your body with warm water from the neck down.  8. DO NOT shower/wash with your normal soap after using and rinsing off the CHG Soap.  9. Pat yourself dry with a CLEAN TOWEL.  10. Wear CLEAN PAJAMAS to bed the night before surgery, wear comfortable clothes the morning of surgery  11. Place CLEAN SHEETS on your bed the night of your first shower and DO NOT SLEEP WITH PETS.    Day of Surgery:  Do not apply any deodorants/lotions.  Please wear clean clothes to the hospital/surgery center.     Remember to brush your teeth WITH YOUR REGULAR TOOTHPASTE.    Please read over the following fact sheets that you were given. Coughing and Deep Breathing, Total Joint Packet, MRSA Information and Surgical Site Infection Prevention

## 2017-10-08 NOTE — Progress Notes (Addendum)
PCP - Lucianne Lei, last office note requested from PCP along with any recent lab work Cardiologist - Dr. Ellyn Hack.  Chest x-ray - 10/08/2017  EKG - 09/10/17 ECHO - 09/14/17 Cardiac Cath - 02/17/13  Fasting Blood Sugar - usually in 130s per patient. Pt takes metformin and uses Vego 20 insulin pump daily. She places this pump at 8AM and removes at 8PM daily. At rate of 20. Pt unable to change rate. Pt does not have insulin pump on today at PAT appointment, so unable to assess pump.  Spoke with DM coordinator, Ander Purpura. Pt will not place insulin pump the morning of surgery d/t inability to change rate. Will need subQ insulin orders for DOS hospitalization if needed. Pt is type 2 diabetic.   Blood Thinner Instructions: Hold Brilinta 7 days prior to surgery per Dr. Ellyn Hack  Anesthesia review: hx CAD, insulin pump  Patient denies shortness of breath, fever, cough and chest pain at PAT appointment   Patient verbalized understanding of instructions that were given to them at the PAT appointment. Patient was also instructed that they will need to review over the PAT instructions again at home before surgery.

## 2017-10-08 NOTE — Progress Notes (Signed)
   10/08/17 1443  OBSTRUCTIVE SLEEP APNEA  Have you ever been diagnosed with sleep apnea through a sleep study? No  Do you snore loudly (loud enough to be heard through closed doors)?  1  Do you often feel tired, fatigued, or sleepy during the daytime (such as falling asleep during driving or talking to someone)? 1  Has anyone observed you stop breathing during your sleep? 1  Do you have, or are you being treated for high blood pressure? 1  BMI more than 35 kg/m2? 1  Age > 50 (1-yes) 1  Neck circumference greater than:Female 16 inches or larger, Female 17inches or larger? 0  Female Gender (Yes=1) 0  Obstructive Sleep Apnea Score 6  Score 5 or greater  Results sent to PCP

## 2017-10-09 ENCOUNTER — Encounter (HOSPITAL_COMMUNITY): Payer: Self-pay

## 2017-10-09 NOTE — Progress Notes (Signed)
Anesthesia Chart Review:  Case:  [id number] Date/Time:  10/17/17 1230   Procedure:  TOTAL HIP ARTHROPLASTY ANTERIOR APPROACH (Left )   Anesthesia type:  Spinal   Pre-op diagnosis:  LEFT HIP OSTEOARTHRITIS   Location:  [facility] OR ROOM 10 / [facility]   Surgeon:  [name], MD      DISCUSSION: Patient is 72 year old female scheduled for the above procedure.  History includes never smoker, DM2 (metformin, V-Go 20 insulin pump), HTN, HLD, NSTEMI 02/17/13 (s/p DES LAD, CX). Recent echo showed mild AS. BMI is consistent with morbid obesity. OSA screening score 6.   Patient has medical and cardiac clearance for surgery (see below). Dr. Ellyn Hack has recommended holding Brilinta 7 days prior to surgery and resuming 2-3 post-operatively.   If no acute changes then I anticipate that she can proceed as planned. Of note, V-Go pump cannot be rate adjusted. PAT RN discussed with DM Associate Professor. Patient will not apply the V-Go pump on the morning of surgery. Subcutaneous insulin can be ordered by surgeon and/or anesthesiologist while the pump is off if felt indicated.   Case is posted for spinal anesthesia. She did have AS on last month's echo, but only mild with mean gradient of 21. Anesthesiologist to review and discuss definitive anesthesia plan on the day of surgery.   VS: BP (!) 150/74   Pulse 76   Temp 36.6 C   Resp 20   Ht 5' 4"  (1.626 m)   Wt 233 lb 6.4 oz (105.9 kg)   SpO2 98%   BMI 40.06 kg/m    PROVIDERS: Lucianne Lei, MD is PCP. Last visit 09/21/17 for surgical clearance.  Glenetta Hew, MD is cardiologist. Last visit 09/11/17. Following recent echo, he wrote,  "With no history of stroke, normal renal function, and a low risk surgery without active heart failure or angina symptoms, she will be a low risk patient for a low risk surgery. She is on moderate dose beta-blocker which provides cardioprotection. Reducing from roughly 1-3% down to closer to 1% risk. I would not do any further cardiac  evaluation echocardiogram. If the echocardiogram is relatively normal with no regional wall motion normalities and normal pump function,Would not recommend any further cardiac evaluation. This echocardiogram is essentially normal with no wall motion normalities. Only mild aortic stenosis noted. This should not preclude her from proceeding with surgery. LOW RISK patient for LOW RISK surgery."   LABS: Labs reviewed: Acceptable for surgery. UA showed moderate leukocytes, but negative nitrites. A1c 7.8. Voice message left for [facility] at Dr. Damita Dunnings office regarding UA/microscopic and A1c results. Defer further recommendations, if any, to surgeon. (all labs ordered are listed, but only abnormal results are displayed)  Labs Reviewed  BASIC METABOLIC PANEL - Abnormal; Notable for the following components:      Result Value   Potassium 3.4 (*)    CO2 21 (*)    Glucose, Bld 191 (*)    Creatinine, Ser 1.06 (*)    GFR calc non Af Amer 52 (*)    All other components within normal limits  CBC WITH DIFFERENTIAL/PLATELET - Abnormal; Notable for the following components:   RBC 3.78 (*)    Hemoglobin 11.0 (*)    HCT 35.9 (*)    All other components within normal limits  GLUCOSE, CAPILLARY - Abnormal; Notable for the following components:   Glucose-Capillary 177 (*)    All other components within normal limits  URINALYSIS, ROUTINE W REFLEX MICROSCOPIC - Abnormal; Notable for the  following components:   APPearance HAZY (*)    Protein, ur 30 (*)    Leukocytes, UA MODERATE (*)    Bacteria, UA FEW (*)    All other components within normal limits  HEMOGLOBIN A1C - Abnormal; Notable for the following components:   Hgb A1c MFr Bld 7.8 (*)    All other components within normal limits  SURGICAL PCR SCREEN  APTT  PROTIME-INR  TYPE AND SCREEN  ABO/RH     IMAGES: CXR 10/08/17: IMPRESSION: No active cardiopulmonary disease.  EKG: 09/10/17: NSR, non-specific ST abnormality.    CV: Echo 09/14/17: Study  Conclusions - Left ventricle: The cavity size was normal. There was mild focal   basal hypertrophy of the septum. Systolic function was normal.   The estimated ejection fraction was in the range of 55% to 60%.   Wall motion was normal; there were no regional wall motion   abnormalities. Doppler parameters are consistent with abnormal   left ventricular relaxation (grade 1 diastolic dysfunction). The   E/e&' ratio is between 8-15, suggesting indeterminate LV filling   pressure. - Aortic valve: Mildly calcified leaflets. Mild stenosis. Mean   gradient (S): 21 mm Hg. Peak gradient (S): 32 mm Hg. Valve area   (VTI): 1.57 cm^2. Valve area (Vmax): 1.69 cm^2. Valve area   (Vmean): 1.51 cm^2. - Mitral valve: Mildly thickened leaflets . There was trivial   regurgitation. - Left atrium: The atrium was normal in size. - Tricuspid valve: There was trivial regurgitation. - Pulmonary arteries: PA peak pressure: 20 mm Hg (S). - Inferior vena cava: The vessel was normal in size. The   respirophasic diameter changes were in the normal range (>= 50%),   consistent with normal central venous pressure. Impressions: - Compared to a prior study in 2014, the LVEF is higher at 55-60%.   There is mild aortic valve stenosis with mean gradient of 21 mmHg   and AVA around 1.6 cm2.  Cardiac cath 02/17/13:  Left Main: Large-caliber vessels, bifurcates into the LAD And Circumflex; angiographically normal. LAD: Moderate to large caliber vessel that wraps the apex. There are several septal perforators and a early mid vessel D1 that has ostial 60% stenosis. This is a brace small-caliber vessel. Beyond D1 in between 2 septal perforators there is a focal 95-90% mid LAD stenosis Left Circumflex: Large-caliber vessel that bifurcates proximally into a high first obtuse marginal branch and the AV groove circumflex is gives rise to 2 small arteries marginal branches and a small posterior lateral system. The proximal vessel  has diffuse 20-30% stenoses after it gives off OM1. In the AV groove there is mild luminal irregularities but nothing significant throughout the remainder of the main Circumflex.  OM1: Moderate to large caliber major branch of the circumflex that courses almost is a Ramus Intermedius. Just after the first major band there is a focal, "apple core "lesion is roughly 80-90%. The remainder the vessel is relatively free of disease and bifurcates distally.  RCA: Moderate caliber, dominant vessel because of a small posterior lateral branch with maybe nodal artery. The main RCA takes 2-3 major bends as it courses distally to terminate as the Right Posterior Descending Artery (RPDA). There is a small Right Posterior AV Groove Branch (RPAV) that gives off several small posterolateral branches as well as AV nodal artery. Mild luminal irregularities noted. POST-OPERATIVE DIAGNOSIS:   Severe 2 vessel CAD with mid LAD and mid OM1 lesions. Most likely culprit lesion was the mid LAD.  Successful 2 vessel PCI on the mid LAD and mid OM 1 with Promus Premier DES stents.  Mildly elevated LVEDP, with mildly reduced EF by echocardiogram. There was suggestion of LAD wall motion abnormality.   Carotid U/S 10/17/13: Summary: Bilateral ICAs: Demonstrated normal patency without evidence of a significant diameter reduction.  This is a normal carotid duplex Doppler evaluation.  Past Medical History:  Diagnosis Date  . Acid reflux   . Anemia    "when I was younger"  . Aortic stenosis    mild AS 09/14/17 echo  . CAD S/P percutaneous coronary angioplasty November 2014   Mid LAD 2.5 mm x 12 mm Promus DES (2.70 mm); proximal OM1 - Promus DES 2.75 mm x 16 mm (2.85 mm)  . Diabetes mellitus type 2 in obese ([place])   . Essential hypertension   . Headache   . Hyperlipidemia with target LDL less than 70   . Non-Q wave ST elevation myocardial infarction (STEMI) involving left anterior descending (LAD) coronary artery November  2014   PCI to LAD and circumflex  . Obesity (BMI 30-39.9) 05/05/2013    Past Surgical History:  Procedure Laterality Date  . ABDOMINAL HYSTERECTOMY  1990s  . Arthroscopic knee surgery Bilateral 1984, 2002   Arthroscopic  . CARDIAC CATHETERIZATION  November 2014   Mid LAD 95%, proximal OM1 80-90% --> two-vessel PCI  . COLONOSCOPY N/A 12/05/2013   Procedure: COLONOSCOPY;  Surgeon: Beryle Beams, MD;  Location: [facility] ENDOSCOPY;  Service: Endoscopy;  Laterality: N/A;  . COLONOSCOPY WITH PROPOFOL N/A 12/11/2014   Procedure: COLONOSCOPY WITH PROPOFOL;  Surgeon: Carol Ada, MD;  Location: [facility] ENDOSCOPY;  Service: Endoscopy;  Laterality: N/A;  . ESOPHAGOGASTRODUODENOSCOPY N/A 12/05/2013   Procedure: ESOPHAGOGASTRODUODENOSCOPY (EGD);  Surgeon: Beryle Beams, MD;  Location: [facility];  Service: Endoscopy;  Laterality: N/A;  . LEFT HEART CATHETERIZATION WITH CORONARY ANGIOGRAM N/A 02/17/2013   Procedure: LEFT HEART CATHETERIZATION WITH CORONARY ANGIOGRAM;  Surgeon: Sanda Klein, MD;  Location: [facility];  Service: Cardiovascular;  Laterality: N/A;  . PERCUTANEOUS CORONARY STENT INTERVENTION (PCI-S)  November 2014   Mid LAD 2.5 mm x 12 mm (2.75 mm) Promus DES; OM1 2.75 mm x 60 mm (2.85 mm) Promus DES  . TRANSTHORACIC ECHOCARDIOGRAM  02/16/2013   EF 40-50%. Moderate concentric LVH. Moderate H. [place] of mid-distal septal and apical region. Grade 1 diastolic dysfunction. Moderate aortic sclerosis without stenosis.    MEDICATIONS: . ACCU-CHEK AVIVA PLUS test strip  . acetaminophen (TYLENOL) 500 MG tablet  . amLODipine (NORVASC) 5 MG tablet  . atorvastatin (LIPITOR) 80 MG tablet  . HUMALOG 100 UNIT/ML injection  . ibuprofen (ADVIL,MOTRIN) 200 MG tablet  . Insulin Disposable Pump (V-GO 20) KIT  . isosorbide mononitrate (IMDUR) 30 MG 24 hr tablet  . Menthol, Topical Analgesic, (ICY HOT MEDICATED SPRAY EX)  . metFORMIN (GLUCOPHAGE) 500 MG tablet  . metoprolol tartrate (LOPRESSOR) 25 MG tablet  .  nitroGLYCERIN (NITROSTAT) 0.4 MG SL tablet  . potassium chloride SA (K-DUR,KLOR-CON) 20 MEQ tablet  . ticagrelor (BRILINTA) 60 MG TABS tablet   No current facility-administered medications for this encounter.    George Hugh [facility] Short Stay Center/Anesthesiology Phone [phone] 10/09/2017 12:55 PM

## 2017-10-16 DIAGNOSIS — M1612 Unilateral primary osteoarthritis, left hip: Secondary | ICD-10-CM | POA: Diagnosis present

## 2017-10-16 MED ORDER — BUPIVACAINE LIPOSOME 1.3 % IJ SUSP
20.0000 mL | INTRAMUSCULAR | Status: DC
  Filled 2017-10-16: qty 20

## 2017-10-16 MED ORDER — TRANEXAMIC ACID 1000 MG/10ML IV SOLN
1000.0000 mg | INTRAVENOUS | Status: AC
  Administered 2017-10-17: 1000 mg via INTRAVENOUS
  Filled 2017-10-16: qty 1100

## 2017-10-16 MED ORDER — TRANEXAMIC ACID 1000 MG/10ML IV SOLN
2000.0000 mg | INTRAVENOUS | Status: DC
  Filled 2017-10-16: qty 20

## 2017-10-16 NOTE — H&P (Signed)
TOTAL HIP ADMISSION H&P  Patient is admitted for left total hip arthroplasty.  Subjective:  Chief Complaint: left hip pain  HPI: Phyllis Conner, 72 y.o. female, has a history of pain and functional disability in the left hip(s) due to arthritis and patient has failed non-surgical conservative treatments for greater than 12 weeks to include NSAID's and/or analgesics, use of assistive devices, weight reduction as appropriate and activity modification.  Onset of symptoms was gradual starting several years ago with gradually worsening course since that time.The patient noted no past surgery on the left hip(s).  Patient currently rates pain in the left hip at 10 out of 10 with activity. Patient has night pain, worsening of pain with activity and weight bearing, trendelenberg gait, pain that interfers with activities of daily living and pain with passive range of motion. Patient has evidence of subchondral cysts, periarticular osteophytes and joint space narrowing by imaging studies. This condition presents safety issues increasing the risk of falls.    There is no current active infection.  Patient Active Problem List   Diagnosis Date Noted  . Preop cardiovascular exam 09/10/2017  . Orthopnea 09/10/2017  . Right carotid bruit 10/25/2013  . Diabetes mellitus type 2 in obese ([place])   . Hyperlipidemia with target LDL less than 70   . Orthostatic hypotension 08/28/2013  . Renal insufficiency 08/19/2013  . Obesity (BMI 30-39.9) 05/05/2013  . CAD S/P percutaneous coronary angioplasty -- LAD/OM1 DES 02/17/13 02/18/2013  . Pulmonary embolism August 2011 (syncope) 02/18/2013  . NSTEMI (non-ST elevated myocardial infarction) ([place]) 02/15/2013  . Essential hypertension 02/15/2013   Past Medical History:  Diagnosis Date  . Acid reflux   . Anemia    "when I was younger"  . Aortic stenosis    mild AS 09/14/17 echo  . CAD S/P percutaneous coronary angioplasty November 2014   Mid LAD 2.5 mm x 12 mm Promus  DES (2.70 mm); proximal OM1 - Promus DES 2.75 mm x 16 mm (2.85 mm)  . Diabetes mellitus type 2 in obese ([place])   . Essential hypertension   . Headache   . Hyperlipidemia with target LDL less than 70   . Non-Q wave ST elevation myocardial infarction (STEMI) involving left anterior descending (LAD) coronary artery November 2014   PCI to LAD and circumflex  . Obesity (BMI 30-39.9) 05/05/2013    Past Surgical History:  Procedure Laterality Date  . ABDOMINAL HYSTERECTOMY  1990s  . Arthroscopic knee surgery Bilateral 1984, 2002   Arthroscopic  . CARDIAC CATHETERIZATION  November 2014   Mid LAD 95%, proximal OM1 80-90% --> two-vessel PCI  . COLONOSCOPY N/A 12/05/2013   Procedure: COLONOSCOPY;  Surgeon: Beryle Beams, MD;  Location: [facility];  Service: Endoscopy;  Laterality: N/A;  . COLONOSCOPY WITH PROPOFOL N/A 12/11/2014   Procedure: COLONOSCOPY WITH PROPOFOL;  Surgeon: Carol Ada, MD;  Location: [facility];  Service: Endoscopy;  Laterality: N/A;  . ESOPHAGOGASTRODUODENOSCOPY N/A 12/05/2013   Procedure: ESOPHAGOGASTRODUODENOSCOPY (EGD);  Surgeon: Beryle Beams, MD;  Location: [facility];  Service: Endoscopy;  Laterality: N/A;  . LEFT HEART CATHETERIZATION WITH CORONARY ANGIOGRAM N/A 02/17/2013   Procedure: LEFT HEART CATHETERIZATION WITH CORONARY ANGIOGRAM;  Surgeon: Sanda Klein, MD;  Location: [facility];  Service: Cardiovascular;  Laterality: N/A;  . PERCUTANEOUS CORONARY STENT INTERVENTION (PCI-S)  November 2014   Mid LAD 2.5 mm x 12 mm (2.75 mm) Promus DES; OM1 2.75 mm x 60 mm (2.85 mm) Promus DES  . TRANSTHORACIC ECHOCARDIOGRAM  02/16/2013  EF 40-50%. Moderate concentric LVH. Moderate H. Shea of mid-distal septal and apical region. Grade 1 diastolic dysfunction. Moderate aortic sclerosis without stenosis.    Current Facility-Administered Medications  Medication Dose Route Frequency Provider Last Rate Last Dose  . [START ON 10/17/2017] bupivacaine liposome (EXPAREL) 1.3 %  injection 266 mg  20 mL Infiltration To OR [name], MD      . Derrill Memo ON 10/17/2017] tranexamic acid (CYKLOKAPRON) 1,000 mg in sodium chloride 0.9 % 100 mL IVPB  1,000 mg Intravenous To OR [name], MD      . Derrill Memo ON 10/17/2017] tranexamic acid (CYKLOKAPRON) 2,000 mg in sodium chloride 0.9 % 50 mL Topical Application  4,970 mg Topical To OR [name], MD       Current Outpatient Medications  Medication Sig Dispense Refill Last Dose  . acetaminophen (TYLENOL) 500 MG tablet Take 1,000 mg by mouth 2 (two) times daily as needed for moderate pain.     Marland Kitchen amLODipine (NORVASC) 5 MG tablet TAKE 1 TABLET BY MOUTH DAILY 90 tablet 3 Taking  . atorvastatin (LIPITOR) 80 MG tablet TAKE 1 TABLET BY MOUTH DAILY AT 6 PM 90 tablet 3 Taking  . HUMALOG 100 UNIT/ML injection Inject 20 Units into the skin continuous.   0 Taking  . ibuprofen (ADVIL,MOTRIN) 200 MG tablet Take 400 mg by mouth 2 (two) times daily as needed for moderate pain.     . isosorbide mononitrate (IMDUR) 30 MG 24 hr tablet TAKE 1 TABLET BY MOUTH EVERY DAY 90 tablet 3 Taking  . Menthol, Topical Analgesic, (ICY HOT MEDICATED SPRAY EX) Apply 1 spray topically daily as needed (pain).     . metFORMIN (GLUCOPHAGE) 500 MG tablet Take 1 tablet (500 mg total) by mouth daily. (Patient taking differently: Take 500 mg by mouth every evening. )   Taking  . metoprolol tartrate (LOPRESSOR) 25 MG tablet Take 1 tablet (25 mg total) by mouth 2 (two) times daily. 180 tablet 3 Taking  . nitroGLYCERIN (NITROSTAT) 0.4 MG SL tablet Place 1 tablet (0.4 mg total) under the tongue every 5 (five) minutes x 3 doses as needed for chest pain. 25 tablet 2 Taking  . potassium chloride SA (K-DUR,KLOR-CON) 20 MEQ tablet Take 20 mEq by mouth daily.   2 Taking  . ticagrelor (BRILINTA) 60 MG TABS tablet Take 60 mg by mouth 2 (two) times daily.     Marland Kitchen ACCU-CHEK AVIVA PLUS test strip 1 each by Other route 2 (two) times daily.   2 Taking  . Insulin Disposable Pump (V-GO 20) KIT  Humalog  3 Taking   Allergies  Allergen Reactions  . Iohexol Hives and Other (See Comments)    Excessive sweating    Social History   Tobacco Use  . Smoking status: Never Smoker  . Smokeless tobacco: Never Used  Substance Use Topics  . Alcohol use: No    Family History  Problem Relation Age of Onset  . Cancer Father        prostate cancer  . Heart disease Mother   . Heart attack Mother   . Asthma Mother   . Heart disease Sister   . Heart disease Brother      Review of Systems  Constitutional: Negative.   HENT: Positive for sinus pain and tinnitus.   Eyes: Positive for blurred vision.  Respiratory: Negative.   Cardiovascular: Negative.   Gastrointestinal: Positive for constipation, diarrhea and nausea.  Musculoskeletal: Positive for joint pain.  Neurological: Negative.   Endo/Heme/Allergies:  Bruises/bleeds easily.  Psychiatric/Behavioral: Negative.     Objective:  Physical Exam  Constitutional: She is oriented to person, place, and time. She appears well-developed and well-nourished.  HENT:  Head: Normocephalic and atraumatic.  Eyes: Pupils are equal, round, and reactive to light.  Neck: Normal range of motion. Neck supple.  Cardiovascular: Intact distal pulses.  Respiratory: Effort normal.  Musculoskeletal: She exhibits tenderness.  Today, the patient has good strength and good range of motion in the right hip.  The patient's left hip does have severe pain with attempted internal rotation.  She has reduced internal rotation and can only internally rotate to 10.  Patient's right knee does have mild tenderness over the medial joint line.  Straight leg raise is negative bilaterally.  Calves are soft and nontender.  Neurological: She is alert and oriented to person, place, and time.  Skin: Skin is warm and dry.  Psychiatric: She has a normal mood and affect. Her behavior is normal. Judgment and thought content normal.    Vital signs in last 24 hours:     Labs:   Estimated body mass index is 40.06 kg/m as calculated from the following:   Height as of 10/08/17: _0  (1.626 m).   Weight as of 10/08/17: 105.9 kg (233 lb 6.4 oz).   Imaging Review Plain radiographs demonstrate multiple views of the left hip and pelvis.  This shows end-stage arthritis left hip bone-on-bone with subchondral cyst formation.    Preoperative templating of the joint replacement has been completed, documented, and submitted to the Operating Room personnel in order to optimize intra-operative equipment management.     Assessment/Plan:  End stage arthritis, left hip(s)  The patient history, physical examination, clinical judgement of the provider and imaging studies are consistent with end stage degenerative joint disease of the left hip(s) and total hip arthroplasty is deemed medically necessary. The treatment options including medical management, injection therapy, arthroscopy and arthroplasty were discussed at length. The risks and benefits of total hip arthroplasty were presented and reviewed. The risks due to aseptic loosening, infection, stiffness, dislocation/subluxation,  thromboembolic complications and other imponderables were discussed.  The patient acknowledged the explanation, agreed to proceed with the plan and consent was signed. Patient is being admitted for inpatient treatment for surgery, pain control, PT, OT, prophylactic antibiotics, VTE prophylaxis, progressive ambulation and ADL's and discharge planning.The patient is planning to be discharged home with home health services

## 2017-10-17 ENCOUNTER — Inpatient Hospital Stay (HOSPITAL_COMMUNITY): Payer: Medicare Other

## 2017-10-17 ENCOUNTER — Inpatient Hospital Stay (HOSPITAL_COMMUNITY): Payer: Medicare Other | Admitting: Anesthesiology

## 2017-10-17 ENCOUNTER — Inpatient Hospital Stay (HOSPITAL_COMMUNITY): Payer: Medicare Other | Admitting: Vascular Surgery

## 2017-10-17 ENCOUNTER — Inpatient Hospital Stay (HOSPITAL_COMMUNITY)
Admission: RE | Admit: 2017-10-17 | Discharge: 2017-10-19 | DRG: 470 | Disposition: A | Discharge status: Home Health | Payer: Medicare Other | Attending: Orthopedic Surgery | Admitting: Orthopedic Surgery

## 2017-10-17 ENCOUNTER — Encounter (HOSPITAL_COMMUNITY): Payer: Self-pay | Admitting: *Deleted

## 2017-10-17 ENCOUNTER — Encounter (HOSPITAL_COMMUNITY)
Admission: RE | Disposition: A | Discharge status: Home Health | Payer: Self-pay | Source: Home / Self Care | Attending: Orthopedic Surgery

## 2017-10-17 DIAGNOSIS — Z91041 Radiographic dye allergy status: Secondary | ICD-10-CM

## 2017-10-17 DIAGNOSIS — I350 Nonrheumatic aortic (valve) stenosis: Secondary | ICD-10-CM | POA: Diagnosis present

## 2017-10-17 DIAGNOSIS — Z9861 Coronary angioplasty status: Secondary | ICD-10-CM

## 2017-10-17 DIAGNOSIS — Z86711 Personal history of pulmonary embolism: Secondary | ICD-10-CM | POA: Diagnosis not present

## 2017-10-17 DIAGNOSIS — I10 Essential (primary) hypertension: Secondary | ICD-10-CM | POA: Diagnosis not present

## 2017-10-17 DIAGNOSIS — D62 Acute posthemorrhagic anemia: Secondary | ICD-10-CM | POA: Diagnosis not present

## 2017-10-17 DIAGNOSIS — E785 Hyperlipidemia, unspecified: Secondary | ICD-10-CM | POA: Diagnosis present

## 2017-10-17 DIAGNOSIS — Z90710 Acquired absence of both cervix and uterus: Secondary | ICD-10-CM

## 2017-10-17 DIAGNOSIS — I252 Old myocardial infarction: Secondary | ICD-10-CM

## 2017-10-17 DIAGNOSIS — M25552 Pain in left hip: Secondary | ICD-10-CM | POA: Diagnosis present

## 2017-10-17 DIAGNOSIS — I2510 Atherosclerotic heart disease of native coronary artery without angina pectoris: Secondary | ICD-10-CM | POA: Diagnosis present

## 2017-10-17 DIAGNOSIS — E119 Type 2 diabetes mellitus without complications: Secondary | ICD-10-CM | POA: Diagnosis present

## 2017-10-17 DIAGNOSIS — E669 Obesity, unspecified: Secondary | ICD-10-CM | POA: Diagnosis present

## 2017-10-17 DIAGNOSIS — K219 Gastro-esophageal reflux disease without esophagitis: Secondary | ICD-10-CM | POA: Diagnosis not present

## 2017-10-17 DIAGNOSIS — Z471 Aftercare following joint replacement surgery: Secondary | ICD-10-CM | POA: Diagnosis not present

## 2017-10-17 DIAGNOSIS — Z8249 Family history of ischemic heart disease and other diseases of the circulatory system: Secondary | ICD-10-CM

## 2017-10-17 DIAGNOSIS — Z794 Long term (current) use of insulin: Secondary | ICD-10-CM | POA: Diagnosis not present

## 2017-10-17 DIAGNOSIS — Z6839 Body mass index (BMI) 39.0-39.9, adult: Secondary | ICD-10-CM | POA: Diagnosis not present

## 2017-10-17 DIAGNOSIS — M1612 Unilateral primary osteoarthritis, left hip: Secondary | ICD-10-CM | POA: Diagnosis present

## 2017-10-17 DIAGNOSIS — Z96642 Presence of left artificial hip joint: Secondary | ICD-10-CM | POA: Diagnosis not present

## 2017-10-17 DIAGNOSIS — Z79899 Other long term (current) drug therapy: Secondary | ICD-10-CM

## 2017-10-17 DIAGNOSIS — Z419 Encounter for procedure for purposes other than remedying health state, unspecified: Secondary | ICD-10-CM

## 2017-10-17 HISTORY — PX: TOTAL HIP ARTHROPLASTY: SHX124

## 2017-10-17 HISTORY — DX: Unspecified osteoarthritis, unspecified site: M19.90

## 2017-10-17 LAB — GLUCOSE, CAPILLARY
GLUCOSE-CAPILLARY: 150 mg/dL — AB (ref 70–99)
GLUCOSE-CAPILLARY: 177 mg/dL — AB (ref 70–99)
GLUCOSE-CAPILLARY: 294 mg/dL — AB (ref 70–99)
Glucose-Capillary: 145 mg/dL — ABNORMAL HIGH (ref 70–99)
Glucose-Capillary: 141 mg/dL — ABNORMAL HIGH (ref 70–99)

## 2017-10-17 LAB — HEMOGLOBIN A1C
Hgb A1c MFr Bld: 7.7 % — ABNORMAL HIGH (ref 4.8–5.6)
MEAN PLASMA GLUCOSE: 174.29 mg/dL

## 2017-10-17 SURGERY — ARTHROPLASTY, HIP, TOTAL, ANTERIOR APPROACH
Anesthesia: Spinal | Site: Hip | Laterality: Left

## 2017-10-17 MED ORDER — KCL IN DEXTROSE-NACL 20-5-0.45 MEQ/L-%-% IV SOLN
INTRAVENOUS | Status: DC
  Administered 2017-10-17: 22:00:00 via INTRAVENOUS
  Filled 2017-10-17: qty 1000

## 2017-10-17 MED ORDER — PANTOPRAZOLE SODIUM 40 MG PO TBEC
40.0000 mg | DELAYED_RELEASE_TABLET | Freq: Every day | ORAL | Status: DC
  Administered 2017-10-18 – 2017-10-19 (×2): 40 mg via ORAL
  Filled 2017-10-17 (×2): qty 1

## 2017-10-17 MED ORDER — ISOSORBIDE MONONITRATE ER 30 MG PO TB24
30.0000 mg | ORAL_TABLET | Freq: Every day | ORAL | Status: DC
  Administered 2017-10-18 – 2017-10-19 (×2): 30 mg via ORAL
  Filled 2017-10-17 (×2): qty 1

## 2017-10-17 MED ORDER — TRANEXAMIC ACID 1000 MG/10ML IV SOLN
1000.0000 mg | Freq: Once | INTRAVENOUS | Status: DC
  Filled 2017-10-17 (×2): qty 10

## 2017-10-17 MED ORDER — BUPIVACAINE-EPINEPHRINE (PF) 0.5% -1:200000 IJ SOLN
INTRAMUSCULAR | Status: DC | PRN
  Administered 2017-10-17: 30 mL

## 2017-10-17 MED ORDER — ONDANSETRON HCL 4 MG/2ML IJ SOLN: 4.0000 mg | Freq: Four times a day (QID) | INTRAMUSCULAR | Status: DC | PRN

## 2017-10-17 MED ORDER — POTASSIUM CHLORIDE CRYS ER 20 MEQ PO TBCR
20.0000 meq | EXTENDED_RELEASE_TABLET | Freq: Every day | ORAL | Status: DC
  Administered 2017-10-17 – 2017-10-19 (×3): 20 meq via ORAL
  Filled 2017-10-17 (×3): qty 1

## 2017-10-17 MED ORDER — CEFAZOLIN SODIUM-DEXTROSE 2-4 GM/100ML-% IV SOLN
2.0000 g | INTRAVENOUS | Status: AC
  Administered 2017-10-17: 2 g via INTRAVENOUS

## 2017-10-17 MED ORDER — TRANEXAMIC ACID 1000 MG/10ML IV SOLN
INTRAVENOUS | Status: DC | PRN
  Administered 2017-10-17: 2000 mg via TOPICAL

## 2017-10-17 MED ORDER — METFORMIN HCL 500 MG PO TABS
500.0000 mg | ORAL_TABLET | Freq: Every evening | ORAL | Status: DC
  Administered 2017-10-17 – 2017-10-18 (×2): 500 mg via ORAL
  Filled 2017-10-17 (×2): qty 1

## 2017-10-17 MED ORDER — METOCLOPRAMIDE HCL 5 MG/ML IJ SOLN: 5.0000 mg | Freq: Three times a day (TID) | INTRAMUSCULAR | Status: DC | PRN

## 2017-10-17 MED ORDER — LACTATED RINGERS IV SOLN
INTRAVENOUS | Status: DC
  Administered 2017-10-17: 12:00:00 via INTRAVENOUS

## 2017-10-17 MED ORDER — CHLORHEXIDINE GLUCONATE 4 % EX LIQD: 60.0000 mL | Freq: Once | CUTANEOUS | Status: DC

## 2017-10-17 MED ORDER — TICAGRELOR 60 MG PO TABS
60.0000 mg | ORAL_TABLET | Freq: Two times a day (BID) | ORAL | Status: DC
  Administered 2017-10-17 – 2017-10-19 (×4): 60 mg via ORAL
  Filled 2017-10-17 (×4): qty 1

## 2017-10-17 MED ORDER — OXYCODONE-ACETAMINOPHEN 5-325 MG PO TABS: 1.0000 | ORAL_TABLET | ORAL | 0 refills | Status: DC | PRN

## 2017-10-17 MED ORDER — PHENOL 1.4 % MT LIQD: 1.0000 | OROMUCOSAL | Status: DC | PRN

## 2017-10-17 MED ORDER — METOCLOPRAMIDE HCL 5 MG/ML IJ SOLN: 10.0000 mg | Freq: Once | INTRAMUSCULAR | Status: DC | PRN

## 2017-10-17 MED ORDER — DEXAMETHASONE SODIUM PHOSPHATE 10 MG/ML IJ SOLN
10.0000 mg | Freq: Once | INTRAMUSCULAR | Status: AC
  Administered 2017-10-18: 10 mg via INTRAVENOUS
  Filled 2017-10-17: qty 1

## 2017-10-17 MED ORDER — ACETAMINOPHEN 500 MG PO TABS
1000.0000 mg | ORAL_TABLET | Freq: Four times a day (QID) | ORAL | Status: AC
  Administered 2017-10-17 – 2017-10-18 (×4): 1000 mg via ORAL
  Filled 2017-10-17 (×4): qty 2

## 2017-10-17 MED ORDER — DOCUSATE SODIUM 100 MG PO CAPS
100.0000 mg | ORAL_CAPSULE | Freq: Two times a day (BID) | ORAL | Status: DC
  Administered 2017-10-17 – 2017-10-19 (×4): 100 mg via ORAL
  Filled 2017-10-17 (×4): qty 1

## 2017-10-17 MED ORDER — FENTANYL CITRATE (PF) 100 MCG/2ML IJ SOLN
25.0000 ug | INTRAMUSCULAR | Status: DC | PRN
  Administered 2017-10-17 (×2): 25 ug via INTRAVENOUS

## 2017-10-17 MED ORDER — NITROGLYCERIN 0.4 MG SL SUBL: 0.4000 mg | SUBLINGUAL_TABLET | SUBLINGUAL | Status: DC | PRN

## 2017-10-17 MED ORDER — INSULIN ASPART 100 UNIT/ML ~~LOC~~ SOLN
0.0000 [IU] | Freq: Three times a day (TID) | SUBCUTANEOUS | Status: DC
  Administered 2017-10-17: 3 [IU] via SUBCUTANEOUS
  Administered 2017-10-18: 8 [IU] via SUBCUTANEOUS
  Administered 2017-10-18: 3 [IU] via SUBCUTANEOUS
  Administered 2017-10-18: 5 [IU] via SUBCUTANEOUS
  Administered 2017-10-19: 3 [IU] via SUBCUTANEOUS

## 2017-10-17 MED ORDER — GABAPENTIN 300 MG PO CAPS
300.0000 mg | ORAL_CAPSULE | Freq: Three times a day (TID) | ORAL | Status: DC
  Administered 2017-10-17 – 2017-10-19 (×5): 300 mg via ORAL
  Filled 2017-10-17 (×5): qty 1

## 2017-10-17 MED ORDER — AMLODIPINE BESYLATE 5 MG PO TABS
5.0000 mg | ORAL_TABLET | Freq: Every day | ORAL | Status: DC
  Administered 2017-10-18 – 2017-10-19 (×2): 5 mg via ORAL
  Filled 2017-10-17 (×2): qty 1

## 2017-10-17 MED ORDER — 0.9 % SODIUM CHLORIDE (POUR BTL) OPTIME
TOPICAL | Status: DC | PRN
  Administered 2017-10-17: 1000 mL

## 2017-10-17 MED ORDER — METOPROLOL TARTRATE 25 MG PO TABS
25.0000 mg | ORAL_TABLET | Freq: Two times a day (BID) | ORAL | Status: DC
  Administered 2017-10-17 – 2017-10-19 (×4): 25 mg via ORAL
  Filled 2017-10-17 (×4): qty 1

## 2017-10-17 MED ORDER — ASPIRIN EC 81 MG PO TBEC: 81.0000 mg | DELAYED_RELEASE_TABLET | Freq: Two times a day (BID) | ORAL | 0 refills | Status: DC

## 2017-10-17 MED ORDER — ALUMINUM HYDROXIDE GEL 320 MG/5ML PO SUSP
15.0000 mL | ORAL | Status: DC | PRN
  Filled 2017-10-17: qty 30

## 2017-10-17 MED ORDER — BUPIVACAINE LIPOSOME 1.3 % IJ SUSP
INTRAMUSCULAR | Status: DC | PRN
  Administered 2017-10-17: 20 mL
  Administered 2017-10-17: 30 mL

## 2017-10-17 MED ORDER — ACETAMINOPHEN 325 MG PO TABS
325.0000 mg | ORAL_TABLET | Freq: Four times a day (QID) | ORAL | Status: DC | PRN
Start: 2017-10-18 — End: 2017-10-19

## 2017-10-17 MED ORDER — CEFAZOLIN SODIUM-DEXTROSE 2-4 GM/100ML-% IV SOLN
INTRAVENOUS | Status: AC
  Filled 2017-10-17: qty 100

## 2017-10-17 MED ORDER — MENTHOL 3 MG MT LOZG: 1.0000 | LOZENGE | OROMUCOSAL | Status: DC | PRN

## 2017-10-17 MED ORDER — METOCLOPRAMIDE HCL 5 MG PO TABS: 5.0000 mg | ORAL_TABLET | Freq: Three times a day (TID) | ORAL | Status: DC | PRN

## 2017-10-17 MED ORDER — MIDAZOLAM HCL 2 MG/2ML IJ SOLN
INTRAMUSCULAR | Status: AC
  Filled 2017-10-17: qty 2

## 2017-10-17 MED ORDER — PROPOFOL 500 MG/50ML IV EMUL
INTRAVENOUS | Status: DC | PRN
  Administered 2017-10-17: 50 ug/kg/min via INTRAVENOUS

## 2017-10-17 MED ORDER — FLEET ENEMA 7-19 GM/118ML RE ENEM: 1.0000 | ENEMA | Freq: Once | RECTAL | Status: DC | PRN

## 2017-10-17 MED ORDER — PHENYLEPHRINE HCL 10 MG/ML IJ SOLN
INTRAVENOUS | Status: DC | PRN
  Administered 2017-10-17: 20 ug/min via INTRAVENOUS

## 2017-10-17 MED ORDER — ONDANSETRON HCL 4 MG/2ML IJ SOLN
INTRAMUSCULAR | Status: DC | PRN
  Administered 2017-10-17: 4 mg via INTRAVENOUS

## 2017-10-17 MED ORDER — OXYCODONE HCL 5 MG PO TABS
5.0000 mg | ORAL_TABLET | ORAL | Status: DC | PRN
  Administered 2017-10-18: 10 mg via ORAL
  Filled 2017-10-17: qty 2

## 2017-10-17 MED ORDER — ASPIRIN 81 MG PO CHEW
81.0000 mg | CHEWABLE_TABLET | Freq: Two times a day (BID) | ORAL | Status: DC
  Administered 2017-10-17 – 2017-10-19 (×4): 81 mg via ORAL
  Filled 2017-10-17 (×4): qty 1

## 2017-10-17 MED ORDER — POLYETHYLENE GLYCOL 3350 17 G PO PACK: 17.0000 g | PACK | Freq: Every day | ORAL | Status: DC | PRN

## 2017-10-17 MED ORDER — PHENYLEPHRINE HCL 10 MG/ML IJ SOLN
INTRAMUSCULAR | Status: DC | PRN
  Administered 2017-10-17 (×2): 80 ug via INTRAVENOUS

## 2017-10-17 MED ORDER — MIDAZOLAM HCL 5 MG/5ML IJ SOLN
INTRAMUSCULAR | Status: DC | PRN
  Administered 2017-10-17: 1 mg via INTRAVENOUS

## 2017-10-17 MED ORDER — BUPIVACAINE IN DEXTROSE 0.75-8.25 % IT SOLN
INTRATHECAL | Status: DC | PRN
  Administered 2017-10-17: 1.7 mL via INTRATHECAL

## 2017-10-17 MED ORDER — EPHEDRINE SULFATE 50 MG/ML IJ SOLN
INTRAMUSCULAR | Status: DC | PRN
  Administered 2017-10-17: 10 mg via INTRAVENOUS

## 2017-10-17 MED ORDER — METHOCARBAMOL 500 MG PO TABS
500.0000 mg | ORAL_TABLET | Freq: Four times a day (QID) | ORAL | Status: DC | PRN
  Administered 2017-10-18: 500 mg via ORAL
  Filled 2017-10-17: qty 1

## 2017-10-17 MED ORDER — DIPHENHYDRAMINE HCL 12.5 MG/5ML PO ELIX: 12.5000 mg | ORAL_SOLUTION | ORAL | Status: DC | PRN

## 2017-10-17 MED ORDER — MEPERIDINE HCL 50 MG/ML IJ SOLN: 6.2500 mg | INTRAMUSCULAR | Status: DC | PRN

## 2017-10-17 MED ORDER — BUPIVACAINE-EPINEPHRINE (PF) 0.5% -1:200000 IJ SOLN
INTRAMUSCULAR | Status: AC
  Filled 2017-10-17: qty 60

## 2017-10-17 MED ORDER — ONDANSETRON HCL 4 MG PO TABS: 4.0000 mg | ORAL_TABLET | Freq: Four times a day (QID) | ORAL | Status: DC | PRN

## 2017-10-17 MED ORDER — DEXAMETHASONE SODIUM PHOSPHATE 10 MG/ML IJ SOLN
INTRAMUSCULAR | Status: DC | PRN
  Administered 2017-10-17: 4 mg via INTRAVENOUS

## 2017-10-17 MED ORDER — EPHEDRINE SULFATE 50 MG/ML IJ SOLN
INTRAMUSCULAR | Status: AC
  Filled 2017-10-17: qty 1

## 2017-10-17 MED ORDER — BISACODYL 5 MG PO TBEC: 5.0000 mg | DELAYED_RELEASE_TABLET | Freq: Every day | ORAL | Status: DC | PRN

## 2017-10-17 MED ORDER — DEXAMETHASONE SODIUM PHOSPHATE 10 MG/ML IJ SOLN
INTRAMUSCULAR | Status: AC
  Filled 2017-10-17: qty 1

## 2017-10-17 MED ORDER — HYDROMORPHONE HCL 1 MG/ML IJ SOLN
0.5000 mg | INTRAMUSCULAR | Status: DC | PRN
  Administered 2017-10-17: 0.5 mg via INTRAVENOUS
  Filled 2017-10-17: qty 1

## 2017-10-17 MED ORDER — ONDANSETRON HCL 4 MG/2ML IJ SOLN
INTRAMUSCULAR | Status: AC
  Filled 2017-10-17: qty 2

## 2017-10-17 MED ORDER — TIZANIDINE HCL 2 MG PO TABS: 2.0000 mg | ORAL_TABLET | Freq: Four times a day (QID) | ORAL | 0 refills | Status: DC | PRN

## 2017-10-17 MED ORDER — FENTANYL CITRATE (PF) 100 MCG/2ML IJ SOLN
INTRAMUSCULAR | Status: AC
  Filled 2017-10-17: qty 2

## 2017-10-17 MED ORDER — INSULIN LISPRO 100 UNIT/ML ~~LOC~~ SOLN: 20.0000 [IU] | SUBCUTANEOUS | Status: DC

## 2017-10-17 MED ORDER — METHOCARBAMOL 1000 MG/10ML IJ SOLN
500.0000 mg | Freq: Four times a day (QID) | INTRAVENOUS | Status: DC | PRN
  Filled 2017-10-17: qty 5

## 2017-10-17 SURGICAL SUPPLY — 49 items
BAG DECANTER FOR FLEXI CONT (MISCELLANEOUS) ×2 IMPLANT
BLADE SAW SGTL 18X1.27X75 (BLADE) ×2 IMPLANT
CAPT HIP TOTAL 2 ×1 IMPLANT
COVER PERINEAL POST (MISCELLANEOUS) ×2 IMPLANT
COVER SURGICAL LIGHT HANDLE (MISCELLANEOUS) ×2 IMPLANT
DECANTER SPIKE VIAL GLASS SM (MISCELLANEOUS) ×2 IMPLANT
DRAPE C-ARM 42X72 X-RAY (DRAPES) ×2 IMPLANT
DRAPE STERI IOBAN 125X83 (DRAPES) ×2 IMPLANT
DRAPE U-SHAPE 47X51 STRL (DRAPES) ×4 IMPLANT
DRSG AQUACEL AG ADV 3.5X10 (GAUZE/BANDAGES/DRESSINGS) ×2 IMPLANT
DURAPREP 26ML APPLICATOR (WOUND CARE) ×2 IMPLANT
ELECT BLADE 4.0 EZ CLEAN MEGAD (MISCELLANEOUS) ×4
ELECT REM PT RETURN 9FT ADLT (ELECTROSURGICAL) ×2
ELECTRODE BLDE 4.0 EZ CLN MEGD (MISCELLANEOUS) ×1 IMPLANT
ELECTRODE REM PT RTRN 9FT ADLT (ELECTROSURGICAL) ×1 IMPLANT
FACESHIELD WRAPAROUND (MASK) ×4 IMPLANT
FACESHIELD WRAPAROUND OR TEAM (MASK) ×2 IMPLANT
GLOVE BIO SURGEON STRL SZ7.5 (GLOVE) ×2 IMPLANT
GLOVE BIO SURGEON STRL SZ8.5 (GLOVE) ×2 IMPLANT
GLOVE BIOGEL PI IND STRL 8 (GLOVE) ×1 IMPLANT
GLOVE BIOGEL PI IND STRL 9 (GLOVE) ×1 IMPLANT
GLOVE BIOGEL PI INDICATOR 8 (GLOVE) ×1
GLOVE BIOGEL PI INDICATOR 9 (GLOVE) ×1
GLOVE SURG SS PI 7.0 STRL IVOR (GLOVE) ×1 IMPLANT
GOWN STRL REUS W/ TWL LRG LVL3 (GOWN DISPOSABLE) ×1 IMPLANT
GOWN STRL REUS W/ TWL XL LVL3 (GOWN DISPOSABLE) ×2 IMPLANT
GOWN STRL REUS W/TWL LRG LVL3 (GOWN DISPOSABLE) ×2
GOWN STRL REUS W/TWL XL LVL3 (GOWN DISPOSABLE) ×4
KIT BASIN OR (CUSTOM PROCEDURE TRAY) ×2 IMPLANT
KIT TURNOVER KIT B (KITS) ×2 IMPLANT
MANIFOLD NEPTUNE II (INSTRUMENTS) ×2 IMPLANT
NEEDLE HYPO 22GX1.5 SAFETY (NEEDLE) ×4 IMPLANT
NS IRRIG 1000ML POUR BTL (IV SOLUTION) ×2 IMPLANT
PACK TOTAL JOINT (CUSTOM PROCEDURE TRAY) ×2 IMPLANT
PAD ARMBOARD 7.5X6 YLW CONV (MISCELLANEOUS) ×4 IMPLANT
SUT ETHIBOND NAB CT1 #1 30IN (SUTURE) ×2 IMPLANT
SUT VIC AB 0 CT1 27 (SUTURE)
SUT VIC AB 0 CT1 27XBRD ANBCTR (SUTURE) IMPLANT
SUT VIC AB 1 CT1 27 (SUTURE) ×2
SUT VIC AB 1 CT1 27XBRD ANBCTR (SUTURE) IMPLANT
SUT VIC AB 1 CTX 36 (SUTURE) ×2
SUT VIC AB 1 CTX36XBRD ANBCTR (SUTURE) ×1 IMPLANT
SUT VIC AB 2-0 CT1 27 (SUTURE) ×2
SUT VIC AB 2-0 CT1 TAPERPNT 27 (SUTURE) ×1 IMPLANT
SUT VIC AB 3-0 CT1 27 (SUTURE) ×2
SUT VIC AB 3-0 CT1 TAPERPNT 27 (SUTURE) ×1 IMPLANT
SYR CONTROL 10ML LL (SYRINGE) ×4 IMPLANT
TOWEL OR 17X26 10 PK STRL BLUE (TOWEL DISPOSABLE) ×2 IMPLANT
TRAY CATH 16FR W/PLASTIC CATH (SET/KITS/TRAYS/PACK) ×1 IMPLANT

## 2017-10-17 NOTE — Op Note (Signed)
OPERATIVE REPORT    DATE OF PROCEDURE:  10/17/2017       PREOPERATIVE DIAGNOSIS:  LEFT HIP OSTEOARTHRITIS                                                          POSTOPERATIVE DIAGNOSIS:  * No post-op diagnosis entered *                                                           PROCEDURE: Anterior L total hip arthroplasty using a 50 mm DePuy Pinnacle  Cup, [employer], 0-degree polyethylene liner, a +1x32 mm ceramic head, a 3 std Depuy Triloc stem   SURGEON: Kerin Salen    ASSISTANT:   Kerry Hough. [employer]  (present throughout entire procedure and necessary for timely completion of the procedure)   ANESTHESIA: Spinal BLOOD LOSS: 350cc FLUID REPLACEMENT: 1500cc crystalloid Antibiotic: 2gm ancef Tranexamic Acid: 1gm IV, 2gm Topical COMPLICATIONS: none    INDICATIONS FOR PROCEDURE: A 72 y.o. year-old With  LEFT HIP OSTEOARTHRITIS   for 3 years, x-rays show bone-on-bone arthritic changes, and osteophytes. Despite conservative measures with observation, anti-inflammatory medicine, narcotics, use of a cane, has severe unremitting pain and can ambulate only a few blocks before resting. Patient desires elective L total hip arthroplasty to decrease pain and increase function. The risks, benefits, and alternatives were discussed at length including but not limited to the risks of infection, bleeding, nerve injury, stiffness, blood clots, the need for revision surgery, cardiopulmonary complications, among others, and they were willing to proceed. Questions answered     PROCEDURE IN DETAIL: The patient was identified by armband,  received preoperative IV antibiotics in the holding area at [facility], taken to the operating room , appropriate anesthetic monitors  were attached and  anesthesia was induced with the patienton the gurney. The HANA boots were applied to the feet and he was then transferred to the HANA table with a peroneal post and support underneath the  non-operative le, which was locked in 5 lb traction. Theoperative lower extremity was then prepped and draped in the usual sterile fashion from just above the iliac crest to the knee. And a timeout procedure was performed. We then made a 12 cm incision along the interval at the leading edge of the tensor fascia lata of starting at 2 cm lateral to and 2 cm distal to the ASIS. Small bleeders in the skin and subcutaneous tissue identified and cauterized we dissected down to the fascia and made an incision in the fascia allowing [place] to elevate the fascia of the tensor muscle and exploited the interval between the rectus and the tensor fascia lata. A Hohmann retractor was then placed along the superior neck of the femur and a Cobra retractor along the inferior neck of the femur we teed the capsule starting out at the superior anterior aspect of the acetabulum going distally and made the T along the neck both leaflets of the T were tagged with #2 Ethibond suture. Cobra retractors were then placed along the inferior and superior neck allowing [place] to perform a standard  neck cut and removed the femoral head with a power corkscrew. We then placed a right angle Hohmann retractor along the anterior aspect of the acetabulum a spiked Cobra in the cotyloid notch and posteriorly a Muelller retractor. We then sequentially reamed up to a 49 mm basket reamer obtaining good coverage in all quadrants, verified by C-arm imaging. Under C-arm control with and hammered into place a 50 mm Pinnacle cup in 45 of abduction and 15 of anteversion. The cup seated nicely and required no supplemental screws. We then placed a central hole Eliminator and a 0 polyethylene liner. The foot was then externally rotated to 110, the HANA elevator was placed around the flare of the greater trochanter and the limb was extended and abducted delivering the proximal femur up into the wound. A medium Hohmann retractor was placed over the greater trochanter and a  Mueller retractor along the posterior femoral neck completing the exposure. We then performed releases superiorly and and inferiorly of the capsule going back to the pirformis fossa superiorly and to the lesser trochanter inferiorly. We then entered the proximal femur with the box cutting offset chisel followed by, a canal sounder, the chili pepper and broaching up to a 3 std broach. This seated nicely and we reamed the calcar. A trial reduction was performed with a 1 mm 32 mm head.The limb lengths were excellent the hip was stable in 90 of external rotation. At this point the trial components removed and we hammered into place a # 3 std  Offset Tri-Lock stem with Gryption coating. A + 1x32 mm ceramic ball was then hammered into place the hip was reduced and final C-arm images obtained. The wound was thoroughly irrigated with normal saline solution. We repaired the ant capsule and the tensor fascia lot a with running 0 vicryl suture. the subcutaneous tissue was closed with 2-0 and 3-0 Vicryl suture followed by an Aquacil dressing. At this point the patient was awaken and transferred to hospital gurney without difficulty. The subcutaneous tissue with 0 and 2-0 undyed Vicryl suture and the skin with running  3-0 vicryl subcuticular suture. Aquacil dressing was applied. The patient was then unclamped, rolled supine, awaken extubated and taken to recovery room without difficulty in stable condition.   Kerin Salen 10/17/2017, 2:18 PM

## 2017-10-17 NOTE — Anesthesia Preprocedure Evaluation (Signed)
Anesthesia Evaluation  Patient identified by MRN, date of birth, ID band Patient awake    Reviewed: Allergy & Precautions, NPO status , Patient's Chart, lab work & pertinent test results  Airway Mallampati: II  TM Distance: >3 FB Neck ROM: Full    Dental no notable dental hx. (+) Upper Dentures, Partial Lower   Pulmonary neg pulmonary ROS,    Pulmonary exam normal breath sounds clear to auscultation       Cardiovascular hypertension, Pt. on medications and Pt. on home beta blockers + CAD, + Past MI and + Cardiac Stents (2014)  negative cardio ROS Normal cardiovascular exam Rhythm:Regular Rate:Normal     Neuro/Psych negative neurological ROS  negative psych ROS   GI/Hepatic negative GI ROS, Neg liver ROS,   Endo/Other  diabetes, Type 2, Insulin Dependent, Oral Hypoglycemic Agents  Renal/GU negative Renal ROS  negative genitourinary   Musculoskeletal negative musculoskeletal ROS (+)   Abdominal   Peds negative pediatric ROS (+)  Hematology negative hematology ROS (+)   Anesthesia Other Findings   Reproductive/Obstetrics negative OB ROS                             Anesthesia Physical Anesthesia Plan  ASA: III  Anesthesia Plan: Spinal   Post-op Pain Management:    Induction:   PONV Risk Score and Plan: 2 and Ondansetron and Treatment may vary due to age or medical condition  Airway Management Planned: Simple Face Mask  Additional Equipment:   Intra-op Plan:   Post-operative Plan:   Informed Consent: I have reviewed the patients History and Physical, chart, labs and discussed the procedure including the risks, benefits and alternatives for the proposed anesthesia with the patient or authorized representative who has indicated his/her understanding and acceptance.     Plan Discussed with:   Anesthesia Plan Comments:         Anesthesia Quick Evaluation

## 2017-10-17 NOTE — Evaluation (Signed)
Physical Therapy Evaluation Patient Details Name: Phyllis Conner MRN: [id number] DOB: 1946/02/12 Today's Date: 10/17/2017   History of Present Illness  Pt is a 72 y/o female s/p elective L THA, anterior hip precautions. PMH includes DM, CAD s/p stent placement, and HTN.   Clinical Impression  Pt is s/p surgery above with deficits below. Pt requiring min to mod A for mobility tasks with use of RW this session and gait distance limited secondary to pain. Educated about supine HEP and hip precautions. Will continue to follow acutely to maximize functional mobility independence and safety.     Follow Up Recommendations Follow surgeon's recommendation for DC plan and follow-up therapies;Supervision for mobility/OOB    Equipment Recommendations  Rolling walker with 5" wheels;3in1 (PT)    Recommendations for Other Services OT consult     Precautions / Restrictions Precautions Precautions: Anterior Hip Precaution Booklet Issued: Yes (comment) Precaution Comments: Reviewed hip precautions and supine HEP.  Restrictions Weight Bearing Restrictions: Yes LLE Weight Bearing: Weight bearing as tolerated      Mobility  Bed Mobility Overal bed mobility: Needs Assistance Bed Mobility: Supine to Sit     Supine to sit: Mod assist     General bed mobility comments: Mod A for LLE assist and assist with scooting hips to EOB. Increased time and use of bed rails required.   Transfers Overall transfer level: Needs assistance Equipment used: Rolling walker (2 wheeled) Transfers: Sit to/from Stand Sit to Stand: Min assist         General transfer comment: Min A for lift assist and steadying. Verbal cues for safe hand placement.   Ambulation/Gait Ambulation/Gait assistance: Min assist;Min guard Gait Distance (Feet): 5 Feet Assistive device: Rolling walker (2 wheeled) Gait Pattern/deviations: Step-to pattern;Decreased step length - right;Decreased step length - left;Decreased weight shift to  left;Antalgic Gait velocity: Decreased    General Gait Details: Slow, antalgic gait. Distance limited to chair secondary to pain. Verbal cues for sequencing using RW and to maintain anterior precautions throughout gait.   Stairs            Wheelchair Mobility    Modified Rankin (Stroke Patients Only)       Balance Overall balance assessment: Needs assistance Sitting-balance support: No upper extremity supported;Feet supported Sitting balance-Leahy Scale: Fair     Standing balance support: Bilateral upper extremity supported;During functional activity Standing balance-Leahy Scale: Poor Standing balance comment: Reliant on BUE support.                              Pertinent Vitals/Pain Pain Assessment: 0-10 Pain Score: 8  Pain Location: L hip  Pain Descriptors / Indicators: Aching;Operative site guarding Pain Intervention(s): Limited activity within patient's tolerance;Monitored during session    Home Living Family/patient expects to be discharged to:: Private residence Living Arrangements: Children;Other relatives Available Help at Discharge: Family;Available 24 hours/day Type of Home: House Home Access: Level entry     Home Layout: Two level;Able to live on main level with bedroom/bathroom Home Equipment: Kasandra Knudsen - single point      Prior Function Level of Independence: Independent;Independent with assistive device(s)         Comments: Occasional use of cane      Hand Dominance   Dominant Hand: Left    Extremity/Trunk Assessment   Upper Extremity Assessment Upper Extremity Assessment: Defer to OT evaluation    Lower Extremity Assessment Lower Extremity Assessment: LLE deficits/detail LLE Deficits / Details:  SEnsory in tact. Deficits consistent with post op pain and weakness.        Communication   Communication: No difficulties  Cognition Arousal/Alertness: Awake/alert Behavior During Therapy: WFL for tasks  assessed/performed Overall Cognitive Status: Within Functional Limits for tasks assessed                                        General Comments General comments (skin integrity, edema, etc.): Multiple family members present during session.     Exercises Total Joint Exercises Ankle Circles/Pumps: AROM;Both;20 reps Quad Sets: AROM;Left;10 reps Heel Slides: AAROM;Left;10 reps   Assessment/Plan    PT Assessment Patient needs continued PT services  PT Problem List Decreased strength;Decreased range of motion;Decreased activity tolerance;Decreased balance;Decreased mobility;Decreased knowledge of use of DME;Decreased knowledge of precautions;Pain       PT Treatment Interventions DME instruction;Gait training;Functional mobility training;Therapeutic activities;Therapeutic exercise;Balance training;Patient/family education    PT Goals (Current goals can be found in the Care Plan section)  Acute Rehab PT Goals Patient Stated Goal: to go home  PT Goal Formulation: With patient Time For Goal Achievement: 10/31/17 Potential to Achieve Goals: Good    Frequency 7X/week   Barriers to discharge        Co-evaluation               AM-PAC PT "6 Clicks" Daily Activity  Outcome Measure Difficulty turning over in bed (including adjusting bedclothes, sheets and blankets)?: A Lot Difficulty moving from lying on back to sitting on the side of the bed? : Unable Difficulty sitting down on and standing up from a chair with arms (e.g., wheelchair, bedside commode, etc,.)?: Unable Help needed moving to and from a bed to chair (including a wheelchair)?: A Little Help needed walking in hospital room?: A Little Help needed climbing 3-5 steps with a railing? : A Lot 6 Click Score: 12    End of Session Equipment Utilized During Treatment: Gait belt Activity Tolerance: Patient limited by pain Patient left: in chair;with call bell/phone within reach;with family/visitor  present Nurse Communication: Mobility status PT Visit Diagnosis: Unsteadiness on feet (R26.81);Other abnormalities of gait and mobility (R26.89);Pain Pain - Right/Left: Left Pain - part of body: Hip    Time: 1737-1800 PT Time Calculation (min) (ACUTE ONLY): 23 min   Charges:   PT Evaluation $PT Eval Low Complexity: 1 Low PT Treatments $Therapeutic Activity: 8-22 mins   PT G Codes:        Leighton Ruff, PT, DPT  Acute Rehabilitation Services  Pager: [phone]   Rudean Hitt 10/17/2017, 6:28 PM

## 2017-10-17 NOTE — Plan of Care (Signed)
  Problem: Education: Goal: Knowledge of General Education information will improve Outcome: Progressing   Problem: Activity: Goal: Risk for activity intolerance will decrease Outcome: Progressing   Problem: Elimination: Goal: Will not experience complications related to bowel motility Outcome: Progressing   Problem: Pain Managment: Goal: General experience of comfort will improve Outcome: Progressing   Problem: Safety: Goal: Ability to remain free from injury will improve Outcome: Progressing   

## 2017-10-17 NOTE — Discharge Instructions (Signed)

## 2017-10-17 NOTE — Plan of Care (Signed)

## 2017-10-17 NOTE — Anesthesia Postprocedure Evaluation (Signed)
Anesthesia Post Note  Patient: Phyllis Conner  Procedure(s) Performed: TOTAL HIP ARTHROPLASTY ANTERIOR APPROACH (Left Hip)     Patient location during evaluation: PACU Anesthesia Type: Spinal Level of consciousness: oriented and awake and alert Pain management: pain level controlled Vital Signs Assessment: post-procedure vital signs reviewed and stable Respiratory status: spontaneous breathing, respiratory function stable and patient connected to nasal cannula oxygen Cardiovascular status: blood pressure returned to baseline and stable Postop Assessment: no headache, no backache and no apparent nausea or vomiting Anesthetic complications: no    Last Vitals:  Vitals:   10/17/17 1600 10/17/17 1614  BP: (!) 150/76 (!) 151/77  Pulse: (!) 55 63  Resp: 17 15  Temp: 36.7 C   SpO2: 98% 98%    Last Pain:  Vitals:   10/17/17 1726  TempSrc:   PainSc: 10-Worst pain ever                 Duwane Gewirtz DAVID

## 2017-10-17 NOTE — Interval H&P Note (Signed)
History and Physical Interval Note:  10/17/2017 12:33 PM  Phyllis Conner  has presented today for surgery, with the diagnosis of LEFT HIP OSTEOARTHRITIS  The various methods of treatment have been discussed with the patient and family. After consideration of risks, benefits and other options for treatment, the patient has consented to  Procedure(s): TOTAL HIP ARTHROPLASTY ANTERIOR APPROACH (Left) as a surgical intervention .  The patient's history has been reviewed, patient examined, no change in status, stable for surgery.  I have reviewed the patient's chart and labs.  Questions were answered to the patient's satisfaction.     Kerin Salen

## 2017-10-17 NOTE — Anesthesia Procedure Notes (Signed)
Procedure Name: MAC Date/Time: 10/17/2017 1:05 PM Performed by: Candis Shine, CRNA Pre-anesthesia Checklist: Patient identified, Emergency Drugs available, Suction available and Patient being monitored Patient Re-evaluated:Patient Re-evaluated prior to induction Oxygen Delivery Method: Simple face mask

## 2017-10-17 NOTE — Transfer of Care (Signed)
Immediate Anesthesia Transfer of Care Note  Patient: Phyllis Conner  Procedure(s) Performed: TOTAL HIP ARTHROPLASTY ANTERIOR APPROACH (Left Hip)  Patient Location: PACU  Anesthesia Type:MAC and Spinal  Level of Consciousness: drowsy and patient cooperative  Airway & Oxygen Therapy: Patient Spontanous Breathing  Post-op Assessment: Report given to RN  Post vital signs: Reviewed and stable  Last Vitals:  Vitals Value Taken Time  BP    Temp    Pulse    Resp    SpO2      Last Pain:  Vitals:   10/17/17 1129  TempSrc:   PainSc: 0-No pain         Complications: No apparent anesthesia complications

## 2017-10-17 NOTE — Anesthesia Procedure Notes (Signed)
Spinal  Patient location during procedure: OR Staffing Anesthesiologist: Jacquelina Hewins, MD Performed: anesthesiologist  Preanesthetic Checklist Completed: patient identified, site marked, surgical consent, pre-op evaluation, timeout performed, IV checked, risks and benefits discussed and monitors and equipment checked Spinal Block Patient position: sitting Prep: Betadine Patient monitoring: heart rate, continuous pulse ox and blood pressure Approach: right paramedian Location: L3-4 Injection technique: single-shot Needle Needle type: Spinocan  Needle gauge: 22 G Needle length: 9 cm Additional Notes Expiration date of kit checked and confirmed. Patient tolerated procedure well, without complications.       

## 2017-10-18 ENCOUNTER — Other Ambulatory Visit: Payer: Self-pay

## 2017-10-18 ENCOUNTER — Encounter (HOSPITAL_COMMUNITY): Payer: Self-pay | Admitting: General Practice

## 2017-10-18 LAB — BASIC METABOLIC PANEL
ANION GAP: 12 (ref 5–15)
BUN: 16 mg/dL (ref 8–23)
CHLORIDE: 104 mmol/L (ref 98–111)
CO2: 21 mmol/L — ABNORMAL LOW (ref 22–32)
Calcium: 8.7 mg/dL — ABNORMAL LOW (ref 8.9–10.3)
Creatinine, Ser: 1.23 mg/dL — ABNORMAL HIGH (ref 0.44–1.00)
GFR calc non Af Amer: 44 mL/min — ABNORMAL LOW (ref >60)
GFR, EST AFRICAN AMERICAN: 51 mL/min — AB (ref >60)
Glucose, Bld: 279 mg/dL — ABNORMAL HIGH (ref 70–99)
POTASSIUM: 4.1 mmol/L (ref 3.5–5.1)
SODIUM: 137 mmol/L (ref 135–145)

## 2017-10-18 LAB — GLUCOSE, CAPILLARY
Glucose-Capillary: 219 mg/dL — ABNORMAL HIGH (ref 70–99)
Glucose-Capillary: 171 mg/dL — ABNORMAL HIGH (ref 70–99)
Glucose-Capillary: 297 mg/dL — ABNORMAL HIGH (ref 70–99)
Glucose-Capillary: 249 mg/dL — ABNORMAL HIGH (ref 70–99)

## 2017-10-18 LAB — CBC
HCT: 30.7 % — ABNORMAL LOW (ref 36.0–46.0)
HEMOGLOBIN: 9.4 g/dL — AB (ref 12.0–15.0)
MCH: 29.2 pg (ref 26.0–34.0)
MCHC: 30.6 g/dL (ref 30.0–36.0)
MCV: 95.3 fL (ref 78.0–100.0)
PLATELETS: 177 10*3/uL (ref 150–400)
RBC: 3.22 MIL/uL — AB (ref 3.87–5.11)
RDW: 13.7 % (ref 11.5–15.5)
WBC: 9.4 10*3/uL (ref 4.0–10.5)

## 2017-10-18 NOTE — Progress Notes (Signed)
Physical Therapy Treatment Patient Details Name: Phyllis Conner MRN: [id number] DOB: Aug 02, 1945 Today's Date: 10/18/2017    History of Present Illness Pt is a 72 y/o female s/p elective L THA, anterior hip precautions. PMH includes DM, CAD s/p stent placement, and HTN.     PT Comments    Pt making excellent progress with functional mobility and tolerated ambulating an increased distance this session. PT will plan for HEP review at next session this PM. Pt would continue to benefit from skilled physical therapy services at this time while admitted and after d/c to address the below listed limitations in order to improve overall safety and independence with functional mobility.    Follow Up Recommendations  Home health PT     Equipment Recommendations  Rolling walker with 5" wheels;3in1 (PT);Other (comment)(pt stated she might be able to borrow a RW from family)    Recommendations for Other Services       Precautions / Restrictions Precautions Precautions: Anterior Hip Precaution Booklet Issued: Yes (comment) Precaution Comments: Reviewed hip precautions and supine HEP.  Restrictions Weight Bearing Restrictions: Yes LLE Weight Bearing: Weight bearing as tolerated    Mobility  Bed Mobility               General bed mobility comments: pt OOB on BSC upon arrival  Transfers Overall transfer level: Needs assistance Equipment used: Rolling walker (2 wheeled) Transfers: Sit to/from Stand Sit to Stand: Supervision         General transfer comment: supervision for safety, pt demonstrated good technique  Ambulation/Gait Ambulation/Gait assistance: Min guard;Supervision Gait Distance (Feet): 300 Feet Assistive device: Rolling walker (2 wheeled) Gait Pattern/deviations: Step-through pattern;Decreased step length - right;Decreased step length - left;Decreased stride length;Decreased weight shift to left Gait velocity: Decreased  Gait velocity interpretation: 1.31 - 2.62  ft/sec, indicative of limited community ambulator General Gait Details: cautious, steady gait with RW; no LOB or need for physical assistance   Stairs             Wheelchair Mobility    Modified Rankin (Stroke Patients Only)       Balance Overall balance assessment: Needs assistance Sitting-balance support: No upper extremity supported;Feet supported Sitting balance-Leahy Scale: Good     Standing balance support: Bilateral upper extremity supported;During functional activity Standing balance-Leahy Scale: Poor                              Cognition Arousal/Alertness: Awake/alert Behavior During Therapy: WFL for tasks assessed/performed Overall Cognitive Status: Within Functional Limits for tasks assessed                                        Exercises      General Comments        Pertinent Vitals/Pain Pain Assessment: 0-10 Pain Score: 2  Pain Location: L hip  Pain Descriptors / Indicators: Aching;Operative site guarding Pain Intervention(s): Monitored during session;Repositioned    Home Living Family/patient expects to be discharged to:: Private residence Living Arrangements: Children;Other relatives                  Prior Function            PT Goals (current goals can now be found in the care plan section) Acute Rehab PT Goals PT Goal Formulation: With patient Time For Goal Achievement: 10/31/17  Potential to Achieve Goals: Good Progress towards PT goals: Progressing toward goals    Frequency    7X/week      PT Plan Current plan remains appropriate    Co-evaluation              AM-PAC PT "6 Clicks" Daily Activity  Outcome Measure  Difficulty turning over in bed (including adjusting bedclothes, sheets and blankets)?: A Little Difficulty moving from lying on back to sitting on the side of the bed? : A Little Difficulty sitting down on and standing up from a chair with arms (e.g., wheelchair,  bedside commode, etc,.)?: Unable Help needed moving to and from a bed to chair (including a wheelchair)?: None Help needed walking in hospital room?: None Help needed climbing 3-5 steps with a railing? : A Little 6 Click Score: 18    End of Session Equipment Utilized During Treatment: Gait belt Activity Tolerance: Patient tolerated treatment well Patient left: with call bell/phone within reach;Other (comment)(sitting EOB) Nurse Communication: Mobility status PT Visit Diagnosis: Unsteadiness on feet (R26.81);Other abnormalities of gait and mobility (R26.89);Pain Pain - Right/Left: Left Pain - part of body: Hip     Time: 9833-8250 PT Time Calculation (min) (ACUTE ONLY): 16 min  Charges:  $Gait Training: 8-22 mins                    G Codes:       [place], [place], DPT [employer] 10/18/2017, 10:08 AM

## 2017-10-18 NOTE — Evaluation (Signed)
Occupational Therapy Evaluation Patient Details Name: Phyllis Conner MRN: [id number] DOB: 24-Nov-1945 Today's Date: 10/18/2017    History of Present Illness Pt is a 72 y/o female s/p elective L THA, anterior hip precautions. PMH includes DM, CAD s/p stent placement, and HTN.    Clinical Impression   PTA patient reports completing ADLs independently (increased time), limited IADLs (daughter assists), and mobility using cane at times.  Patient currently requires setup assist for UB ADL, min assist for LB bathing, mod assist for LB dressing, supervision for toileting and toilet transfers to 3:1 commode over toilet.  She was unable to complete simulated tub transfer this date, but educated on technique. Educated on safety, precautions and DME this session. Patient will need 3:1 commode at dc, no follow up OT services required.  Recommend continued OT services while admitted in order to maximize independence and safety with ADLs prior to dc home with family support.  Anticipate patient will progress well, plan on next session education on LB AE and tub transfers.     Follow Up Recommendations  No OT follow up;Supervision/Assistance - 24 hour    Equipment Recommendations  3 in 1 bedside commode    Recommendations for Other Services       Precautions / Restrictions Precautions Precautions: Anterior Hip Precaution Booklet Issued: No(issued in prior PT session) Restrictions Weight Bearing Restrictions: Yes LLE Weight Bearing: Weight bearing as tolerated      Mobility Bed Mobility Overal bed mobility: Needs Assistance Bed Mobility: Supine to Sit     Supine to sit: Supervision;HOB elevated     General bed mobility comments: used B UEs to assist L LE towards EOB   Transfers Overall transfer level: Needs assistance Equipment used: Rolling walker (2 wheeled) Transfers: Sit to/from Stand Sit to Stand: Supervision         General transfer comment: supervision for safety, pt  demonstrated good technique    Balance Overall balance assessment: Needs assistance Sitting-balance support: No upper extremity supported;Feet supported Sitting balance-Leahy Scale: Good     Standing balance support: No upper extremity supported;During functional activity Standing balance-Leahy Scale: Fair Standing balance comment: able to wash hands with 0 hand support given supervision                           ADL either performed or assessed with clinical judgement   ADL Overall ADL's : Needs assistance/impaired Eating/Feeding: Set up;Sitting   Grooming: Wash/dry hands;Supervision/safety;Standing   Upper Body Bathing: Supervision/ safety;Sitting   Lower Body Bathing: Minimal assistance;Sit to/from stand;Cueing for safety Lower Body Bathing Details (indicate cue type and reason): educated on compensatory techniques and safety Upper Body Dressing : Sitting;Set up   Lower Body Dressing: Moderate assistance;Sit to/from stand Lower Body Dressing Details (indicate cue type and reason): would benefit from AE training, patient with decreased functional reach to B feet ' Toilet Transfer: Supervision/safety;Ambulation;BSC;RW Toilet Transfer Details (indicate cue type and reason): demonstrating good safety and technique, cueing to reach for 3:1 arm vs grabbar Toileting- Clothing Manipulation and Hygiene: Supervision/safety;Sit to/from stand   Tub/ Banker: Tub transfer;3 in 1 Tub/Shower Transfer Details (indicate cue type and reason): simulated home setup and verbalized education on technique; patient attempted but unable to step L LE over tub ledge today Functional mobility during ADLs: Supervision/safety;Rolling walker General ADL Comments: highly motivated     Vision   Vision Assessment?: No apparent visual deficits     Perception  Praxis      Pertinent Vitals/Pain Pain Assessment: 0-10 Pain Score: 2  Pain Location: L hip  Pain Descriptors /  Indicators: Aching;Operative site guarding Pain Intervention(s): Monitored during session;Repositioned     Hand Dominance Left   Extremity/Trunk Assessment Upper Extremity Assessment Upper Extremity Assessment: Overall WFL for tasks assessed   Lower Extremity Assessment Lower Extremity Assessment: Defer to PT evaluation       Communication Communication Communication: No difficulties   Cognition Arousal/Alertness: Awake/alert Behavior During Therapy: WFL for tasks assessed/performed Overall Cognitive Status: Within Functional Limits for tasks assessed                                     General Comments  multiple family members present     Exercises     Shoulder Instructions      Home Living Family/patient expects to be discharged to:: Private residence Living Arrangements: Children;Other relatives Available Help at Discharge: Family;Available 24 hours/day Type of Home: House Home Access: Level entry     Home Layout: Two level;Able to live on main level with bedroom/bathroom     Bathroom Shower/Tub: Teacher, early years/pre: Standard     Home Equipment: Cane - single point          Prior Functioning/Environment Level of Independence: Independent;Independent with assistive device(s)        Comments: reports required increased time with ADLs, using cane occasionally; reports daughter assists with IADLs        OT Problem List: Decreased strength;Decreased activity tolerance;Impaired balance (sitting and/or standing);Decreased knowledge of use of DME or AE      OT Treatment/Interventions: Self-care/ADL training;Energy conservation;DME and/or AE instruction;Therapeutic activities;Balance training;Patient/family education    OT Goals(Current goals can be found in the care plan section) Acute Rehab OT Goals Patient Stated Goal: to go home  OT Goal Formulation: With patient Time For Goal Achievement: 11/01/17 Potential to Achieve  Goals: Good  OT Frequency: Min 2X/week   Barriers to D/C:            Co-evaluation              AM-PAC PT "6 Clicks" Daily Activity     Outcome Measure Help from another person eating meals?: None Help from another person taking care of personal grooming?: A Little Help from another person toileting, which includes using toliet, bedpan, or urinal?: A Little Help from another person bathing (including washing, rinsing, drying)?: A Little Help from another person to put on and taking off regular upper body clothing?: None Help from another person to put on and taking off regular lower body clothing?: A Lot 6 Click Score: 19   End of Session Equipment Utilized During Treatment: Rolling walker  Activity Tolerance: Patient tolerated treatment well Patient left: in chair;with call bell/phone within reach;with family/visitor present(hand off to PT)  OT Visit Diagnosis: Other abnormalities of gait and mobility (R26.89);Muscle weakness (generalized) (M62.81)                Time: 3614-4315 OT Time Calculation (min): 23 min Charges:  OT General Charges $OT Visit: 1 Visit OT Evaluation $OT Eval Low Complexity: 1 Low G-Codes:     Delight Stare, OTR/L  Pager [place] 10/18/2017, 2:31 PM

## 2017-10-18 NOTE — Progress Notes (Signed)
Physical Therapy Treatment Patient Details Name: Phyllis Conner MRN: [id number] DOB: [birth year] Today's Date: 10/18/2017    History of Present Illness Pt is a 72 y/o female s/p elective L THA, anterior hip precautions. PMH includes DM, CAD s/p stent placement, and HTN.     PT Comments    Pt continuing to make great progress with functional mobility. Plan is for pt to d/c home tomorrow with family. Pt is ready to d/c from a PT perspective. Will continue to follow acutely.  Pt would continue to benefit from skilled physical therapy services at this time while admitted and after d/c to address the below listed limitations in order to improve overall safety and independence with functional mobility.    Follow Up Recommendations  Home health PT     Equipment Recommendations  Rolling walker with 5" wheels;3in1 (PT);Other (comment)(pt stated she might be able to borrow a RW from family)    Recommendations for Other Services       Precautions / Restrictions Precautions Precautions: Anterior Hip Precaution Booklet Issued: Yes (comment) Precaution Comments: Reviewed hip precautions with pt throughout Restrictions Weight Bearing Restrictions: Yes LLE Weight Bearing: Weight bearing as tolerated    Mobility  Bed Mobility Overal bed mobility: Needs Assistance Bed Mobility: Supine to Sit     Supine to sit: Supervision;HOB elevated     General bed mobility comments: pt OOB in recliner chair upon arrival  Transfers Overall transfer level: Needs assistance Equipment used: Rolling walker (2 wheeled) Transfers: Sit to/from Stand Sit to Stand: Supervision         General transfer comment: supervision for safety, pt demonstrated good technique  Ambulation/Gait Ambulation/Gait assistance: Supervision Gait Distance (Feet): 400 Feet Assistive device: Rolling walker (2 wheeled) Gait Pattern/deviations: Step-through pattern;Decreased step length - right;Decreased step length -  left;Decreased stride length;Decreased weight shift to left Gait velocity: Decreased  Gait velocity interpretation: 1.31 - 2.62 ft/sec, indicative of limited community ambulator General Gait Details: cautious, steady gait with RW; no LOB or need for physical assistance   Stairs             Wheelchair Mobility    Modified Rankin (Stroke Patients Only)       Balance Overall balance assessment: Needs assistance Sitting-balance support: No upper extremity supported;Feet supported Sitting balance-Leahy Scale: Good     Standing balance support: No upper extremity supported;During functional activity Standing balance-Leahy Scale: Fair Standing balance comment: able to wash hands with 0 hand support given supervision                            Cognition Arousal/Alertness: Awake/alert Behavior During Therapy: WFL for tasks assessed/performed Overall Cognitive Status: Within Functional Limits for tasks assessed                                        Exercises Total Joint Exercises Long Arc Quad: AROM;Strengthening;Both;10 reps;Seated General Exercises - Lower Extremity Hip Flexion/Marching: Seated;Strengthening;Both;10 reps Mini-Sqauts: AROM;Strengthening;Both;10 reps;Standing    General Comments General comments (skin integrity, edema, etc.): multiple family members present       Pertinent Vitals/Pain Pain Assessment: 0-10 Pain Score: 1  Pain Location: L hip  Pain Descriptors / Indicators: Aching;Operative site guarding Pain Intervention(s): Monitored during session;Repositioned    Home Living Family/patient expects to be discharged to:: Private residence Living Arrangements: Children;Other relatives Available Help at  Discharge: Family;Available 24 hours/day Type of Home: House Home Access: Level entry   Home Layout: Two level;Able to live on main level with bedroom/bathroom Home Equipment: Kasandra Knudsen - single point      Prior Function  Level of Independence: Independent;Independent with assistive device(s)      Comments: reports required increased time with ADLs, using cane occasionally; reports daughter assists with IADLs   PT Goals (current goals can now be found in the care plan section) Acute Rehab PT Goals Patient Stated Goal: to go home  PT Goal Formulation: With patient Time For Goal Achievement: 10/31/17 Potential to Achieve Goals: Good Progress towards PT goals: Progressing toward goals    Frequency    7X/week      PT Plan Current plan remains appropriate    Co-evaluation              AM-PAC PT "6 Clicks" Daily Activity  Outcome Measure  Difficulty turning over in bed (including adjusting bedclothes, sheets and blankets)?: A Little Difficulty moving from lying on back to sitting on the side of the bed? : A Little Difficulty sitting down on and standing up from a chair with arms (e.g., wheelchair, bedside commode, etc,.)?: Unable Help needed moving to and from a bed to chair (including a wheelchair)?: None Help needed walking in hospital room?: None Help needed climbing 3-5 steps with a railing? : A Little 6 Click Score: 18    End of Session   Activity Tolerance: Patient tolerated treatment well Patient left: in chair;with call bell/phone within reach;with family/visitor present Nurse Communication: Mobility status PT Visit Diagnosis: Unsteadiness on feet (R26.81);Other abnormalities of gait and mobility (R26.89);Pain Pain - Right/Left: Left Pain - part of body: Hip     Time: 0071-2197 PT Time Calculation (min) (ACUTE ONLY): 20 min  Charges:  $Gait Training: 8-22 mins                    G Codes:       [place], [place], [place] 10/18/2017, 3:31 PM

## 2017-10-18 NOTE — Progress Notes (Signed)
PATIENT ID: Phyllis Conner  MRN: [id number]  DOB/AGE:  11-11-1945 / 72 y.o.  1 Day Post-Op Procedure(s) (LRB): TOTAL HIP ARTHROPLASTY ANTERIOR APPROACH (Left)    PROGRESS NOTE Subjective: Patient is alert, oriented, no Nausea, no Vomiting, yes passing gas, . Taking PO well. Denies SOB, Chest or Calf Pain. Using Incentive Spirometer, PAS in place. Ambulate 5' WBAT Patient reports pain as  2/10  .    Objective: Vital signs in last 24 hours: Vitals:   10/17/17 1701 10/17/17 2024 10/17/17 2323 10/18/17 0336  BP: (Abnormal) 148/84 138/77 127/68 (Abnormal) 159/73  Pulse: 62 78 76 68  Resp:   14 18  Temp: 97.9 F (36.6 C) 98 F (36.7 C) 98.1 F (36.7 C) 97.6 F (36.4 C)  TempSrc: Oral  Oral Oral  SpO2: 99% 98% 100% 100%  Weight:      Height:          Intake/Output from previous day: I/O last 3 completed shifts: In: 220 [P.O.:120; I.V.:100] Out: 850 [Urine:550; Blood:300]   Intake/Output this shift: Total I/O In: 675 [I.V.:675] Out: -    LABORATORY DATA: Recent Labs    10/17/17 1501 10/17/17 1642 10/17/17 2114 10/18/17 0355  WBC  --   --   --  9.4  HGB  --   --   --  9.4*  HCT  --   --   --  30.7*  PLT  --   --   --  177  NA  --   --   --  137  K  --   --   --  4.1  CL  --   --   --  104  CO2  --   --   --  21*  BUN  --   --   --  16  CREATININE  --   --   --  1.23*  GLUCOSE  --   --   --  279*  GLUCAP 141* 177* 294*  --   CALCIUM  --   --   --  8.7*    Examination: Neurologically intact ABD soft Neurovascular intact Sensation intact distally Intact pulses distally Dorsiflexion/Plantar flexion intact Incision: dressing C/D/I No cellulitis present Compartment soft} XR AP&Lat of hip shows well placed\fixed THA  Assessment:   1 Day Post-Op Procedure(s) (LRB): TOTAL HIP ARTHROPLASTY ANTERIOR APPROACH (Left) ADDITIONAL DIAGNOSIS:  Expected Acute Blood Loss Anemia, Diabetes, Hypertension and Renal Insufficiency Chronic, CAD,obesity  Plan: PT/OT WBAT,  THA  DVT Prophylaxis: SCDx72 hrs, ASA 325 mg BID x 2 weeks  DISCHARGE PLAN: Home  DISCHARGE NEEDS: HHPT, Walker and 3-in-1 comode seat

## 2017-10-19 LAB — GLUCOSE, CAPILLARY
Glucose-Capillary: 152 mg/dL — ABNORMAL HIGH (ref 70–99)
Glucose-Capillary: 192 mg/dL — ABNORMAL HIGH (ref 70–99)

## 2017-10-19 LAB — CBC
HCT: 28.1 % — ABNORMAL LOW (ref 36.0–46.0)
Hemoglobin: 8.8 g/dL — ABNORMAL LOW (ref 12.0–15.0)
MCH: 29.4 pg (ref 26.0–34.0)
MCHC: 31.3 g/dL (ref 30.0–36.0)
MCV: 94 fL (ref 78.0–100.0)
Platelets: 185 10*3/uL (ref 150–400)
RBC: 2.99 MIL/uL — AB (ref 3.87–5.11)
RDW: 14.2 % (ref 11.5–15.5)
WBC: 10.3 10*3/uL (ref 4.0–10.5)

## 2017-10-19 NOTE — Progress Notes (Signed)
Occupational Therapy Treatment Patient Details Name: Phyllis Conner MRN: [id number] DOB: [birth year] Today's Date: 10/19/2017    History of present illness Pt is a 72 y/o female s/p elective L THA, anterior hip precautions. PMH includes DM, CAD s/p stent placement, and HTN.    OT comments  Patient is progressing well.  Demonstrating ability to complete toilet transfers, toileting and grooming with modified independence today using RW.  Requires increased time to complete LB dressing but able to don/doff B socks without AE.  Patient requiring min guard assist for tub transfers, but able to successfully lift L LE over ledge today; able to teach back technique and agreeable to have daughter assist for safety.  Patient plans to dc home today, will have support of family as needed.  Will continue to follow while admitted.     Follow Up Recommendations  No OT follow up;Supervision/Assistance - 24 hour    Equipment Recommendations  3 in 1 bedside commode    Recommendations for Other Services      Precautions / Restrictions Precautions Precautions: Anterior Hip Precaution Booklet Issued: No Precaution Comments: Reviewed hip precautions with pt throughout Restrictions Weight Bearing Restrictions: Yes LLE Weight Bearing: Weight bearing as tolerated       Mobility Bed Mobility               General bed mobility comments: pt OOB standing in bathroom upon arrival  Transfers Overall transfer level: Needs assistance Equipment used: Rolling walker (2 wheeled) Transfers: Sit to/from Stand Sit to Stand: Modified independent (Device/Increase time)         General transfer comment: good safety, demostrating good technique    Balance Overall balance assessment: Needs assistance Sitting-balance support: No upper extremity supported;Feet supported Sitting balance-Leahy Scale: Good     Standing balance support: No upper extremity supported;During functional activity Standing  balance-Leahy Scale: Fair Standing balance comment: able to wash hands with 0 hand support                            ADL either performed or assessed with clinical judgement   ADL Overall ADL's : Needs assistance/impaired     Grooming: Modified independent;Standing;Wash/dry hands       Lower Body Bathing: Supervison/ safety;Sit to/from stand Lower Body Bathing Details (indicate cue type and reason): reviewed compensatory techniques and safety for bathing seated in shower with supervision     Lower Body Dressing: Supervision/safety;Sit to/from stand Lower Body Dressing Details (indicate cue type and reason): demostrates ability to don/doff B socks with increased time, educated on AE but patient preference to complete without  Toilet Transfer: Modified Independent;BSC;Comfort height toilet;Ambulation;RW Toilet Transfer Details (indicate cue type and reason): good safety and technique, exiting commode as therapist entering room Toileting- Clothing Manipulation and Hygiene: Modified independent;Sit to/from stand   Tub/ Shower Transfer: Tub transfer;Min guard;Ambulation;3 in 1;Rolling walker Tub/Shower Transfer Details (indicate cue type and reason): reviewed technique and patient able to teach back to therapist, completed with min gaurd givne increased time and effort to step L LE over ledge Functional mobility during ADLs: Rolling walker;Modified independent General ADL Comments: highly motivated     Manufacturing systems engineer      Cognition Arousal/Alertness: Awake/alert Behavior During Therapy: WFL for tasks assessed/performed Overall Cognitive Status: Within Functional Limits for tasks assessed  Exercises     Shoulder Instructions       General Comments      Pertinent Vitals/ Pain       Pain Assessment: 0-10 Pain Score: 2  Pain Location: L hip  Pain Descriptors / Indicators:  Aching;Operative site guarding Pain Intervention(s): Monitored during session  Home Living                                          Prior Functioning/Environment              Frequency  Min 2X/week        Progress Toward Goals  OT Goals(current goals can now be found in the care plan section)  Progress towards OT goals: Progressing toward goals  Acute Rehab OT Goals Patient Stated Goal: to go home  OT Goal Formulation: With patient Time For Goal Achievement: 11/01/17 Potential to Achieve Goals: Good  Plan Discharge plan remains appropriate;Frequency remains appropriate    Co-evaluation                 AM-PAC PT "6 Clicks" Daily Activity     Outcome Measure   Help from another person eating meals?: None Help from another person taking care of personal grooming?: None Help from another person toileting, which includes using toliet, bedpan, or urinal?: None Help from another person bathing (including washing, rinsing, drying)?: A Little Help from another person to put on and taking off regular upper body clothing?: None Help from another person to put on and taking off regular lower body clothing?: A Little 6 Click Score: 22    End of Session Equipment Utilized During Treatment: Gait belt;Rolling walker  OT Visit Diagnosis: Other abnormalities of gait and mobility (R26.89);Muscle weakness (generalized) (M62.81)   Activity Tolerance Patient tolerated treatment well   Patient Left with call bell/phone within reach(seated EOB )   Nurse Communication Mobility status(pt needs )        Time: 6144-3154 OT Time Calculation (min): 19 min  Charges: OT General Charges $OT Visit: 1 Visit OT Treatments $Self Care/Home Management : 8-22 mins  Delight Stare, OTR/L  Pager [place] 10/19/2017, 10:20 AM

## 2017-10-19 NOTE — Care Management Note (Signed)
Case Management Note  Patient Details  Name: Phyllis Conner MRN: [id number] Date of Birth: May 10, 1945  Subjective/Objective:                    Action/Plan: Pt discharging home with orders for [facility] services. CM provided her choice and she selected [place]. Jermaine with [facility] notified and accepted the referral.  Pt with orders for 3 in 1. Jermaine with [facility] aware and will have equipment delivered to the room. Pt states she has transportation home and assistance at home per her daughter.   Expected Discharge Date:  10/19/17               Expected Discharge Plan:  [place]  In-House Referral:     Discharge planning Services  CM Consult  Post Acute Care Choice:  Durable Medical Equipment, Home Health Choice offered to:  Patient  DME Arranged:  3-N-1 DME Agency:  [place]:  PT [place]:  [place]  Status of Service:  Completed, signed off  If discussed at [place] of Stay Meetings, dates discussed:    Additional Comments:  Pollie Friar, RN 10/19/2017, 12:19 PM

## 2017-10-19 NOTE — Progress Notes (Signed)
PATIENT ID: Phyllis Conner  MRN: [id number]  DOB/AGE:  05-04-1945 / 72 y.o.  2 Days Post-Op Procedure(s) (LRB): TOTAL HIP ARTHROPLASTY ANTERIOR APPROACH (Left)    PROGRESS NOTE Subjective: Patient is alert, oriented, no Nausea, no Vomiting, yes passing gas, . Taking PO well. Denies SOB, Chest or Calf Pain. Using Incentive Spirometer, PAS in place. Ambulate WBAT with pt walking 400 ft with therapy Patient reports pain as  2/10  .    Objective: Vital signs in last 24 hours: Vitals:   10/18/17 1007 10/18/17 1335 10/18/17 2044 10/19/17 0554  BP: (!) 159/73 118/74 (!) 142/67 (!) 165/85  Pulse: 68 70 71 61  Resp:  18    Temp:  (!) 97.3 F (36.3 C) (!) 97.4 F (36.3 C) 98 F (36.7 C)  TempSrc:  Oral Oral Oral  SpO2:  99% 100% 100%  Weight:      Height:          Intake/Output from previous day: I/O last 3 completed shifts: In: 8295 [P.O.:720; I.V.:675] Out: -    Intake/Output this shift: No intake/output data recorded.   LABORATORY DATA: Recent Labs    10/18/17 0355  10/18/17 1612 10/18/17 2140 10/19/17 0456 10/19/17 0656  WBC 9.4  --   --   --  10.3  --   HGB 9.4*  --   --   --  8.8*  --   HCT 30.7*  --   --   --  28.1*  --   PLT 177  --   --   --  185  --   NA 137  --   --   --   --   --   K 4.1  --   --   --   --   --   CL 104  --   --   --   --   --   CO2 21*  --   --   --   --   --   BUN 16  --   --   --   --   --   CREATININE 1.23*  --   --   --   --   --   GLUCOSE 279*  --   --   --   --   --   GLUCAP  --    < > 297* 249*  --  152*  CALCIUM 8.7*  --   --   --   --   --    < > = values in this interval not displayed.    Examination: Neurologically intact Neurovascular intact Sensation intact distally Intact pulses distally Dorsiflexion/Plantar flexion intact Incision: dressing C/D/I No cellulitis present Compartment soft} XR AP&Lat of hip shows well placed\fixed THA  Assessment:   2 Days Post-Op Procedure(s) (LRB): TOTAL HIP ARTHROPLASTY ANTERIOR  APPROACH (Left) ADDITIONAL DIAGNOSIS:  Expected Acute Blood Loss Anemia, Diabetes and Hypertension  Plan: PT/OT WBAT, THA  DVT Prophylaxis: SCDx72 hrs, ASA 81 mg BID x 2 weeks  DISCHARGE PLAN: Home  DISCHARGE NEEDS: HHPT, Walker and 3-in-1 comode seat

## 2017-10-19 NOTE — Progress Notes (Signed)
PT Cancellation Note  Patient Details Name: Phyllis Conner MRN: [id number] DOB: April 16, 1945   Cancelled Treatment:    Reason Eval/Treat Not Completed: (P) Patient at procedure or test/unavailable(Pt unavailable currently washing up in bathroom, will f/u when patient is finished.  )   Cristela Blue 10/19/2017, 10:06 AM  Governor Rooks, PTA pager [phone]

## 2017-10-19 NOTE — Progress Notes (Addendum)
Physical Therapy Treatment Patient Details Name: Phyllis Conner MRN: [id number] DOB: 1945-10-10 Today's Date: 10/19/2017    History of Present Illness Pt is a 72 y/o female s/p elective L THA, anterior hip precautions. PMH includes DM, CAD s/p stent placement, and HTN.     PT Comments    Pt performed gait training and review of seated and supine exercises from HEP.  HEP issued and precautions hand out as well.  Pt required cues for recall of anterior hip precautions and able to recall 1/3 pre tx and 2/3 post tx.  Plan for f/u in pm if patient remains hospitalized.      Follow Up Recommendations  Home health PT     Equipment Recommendations  Rolling walker with 5" wheels;3in1 (PT);Other (comment)    Recommendations for Other Services OT consult     Precautions / Restrictions Precautions Precautions: Anterior Hip Precaution Booklet Issued: No Precaution Comments: Reviewed hip precautions with pt throughout Restrictions Weight Bearing Restrictions: Yes LLE Weight Bearing: Weight bearing as tolerated    Mobility  Bed Mobility Overal bed mobility: Needs Assistance Bed Mobility: Supine to Sit     Supine to sit: Supervision;HOB elevated     General bed mobility comments: cues for safety no physical assistance needed.    Transfers Overall transfer level: Needs assistance Equipment used: Rolling walker (2 wheeled) Transfers: Sit to/from Stand Sit to Stand: Supervision         General transfer comment: Cues for hand placement and eccentric loading to seated surface.  Pt performed multiple reps to ensure correct technique.    Ambulation/Gait Ambulation/Gait assistance: Supervision Gait Distance (Feet): 600 Feet Assistive device: Rolling walker (2 wheeled) Gait Pattern/deviations: Step-through pattern;Decreased step length - right;Decreased step length - left;Decreased stride length;Decreased weight shift to left Gait velocity: Decreased    General Gait Details: Cues  for upper trunk control.     Stairs Stairs: (Pt reports she has no stairs to enter home.  )           Wheelchair Mobility    Modified Rankin (Stroke Patients Only)       Balance Overall balance assessment: Needs assistance Sitting-balance support: No upper extremity supported;Feet supported Sitting balance-Leahy Scale: Good       Standing balance-Leahy Scale: Fair                              Cognition Arousal/Alertness: Awake/alert Behavior During Therapy: WFL for tasks assessed/performed Overall Cognitive Status: Within Functional Limits for tasks assessed                                        Exercises Total Joint Exercises Ankle Circles/Pumps: AROM;Both;20 reps;Supine Quad Sets: AROM;Left;10 reps;Supine Short Arc Quad: AROM;Left;10 reps;Supine Heel Slides: Left;10 reps;Supine;AAROM(cues to avoid ER.) Long Arc Quad: AROM;Strengthening;10 reps;Seated;Left    General Comments        Pertinent Vitals/Pain Pain Assessment: 0-10 Pain Score: 2  Pain Location: L hip  Pain Descriptors / Indicators: Aching;Operative site guarding Pain Intervention(s): Monitored during session;Repositioned    Home Living                      Prior Function            PT Goals (current goals can now be found in the care plan section) Acute Rehab  PT Goals Patient Stated Goal: to go home  Potential to Achieve Goals: Good Progress towards PT goals: Progressing toward goals    Frequency           PT Plan Current plan remains appropriate    Co-evaluation              AM-PAC PT "6 Clicks" Daily Activity  Outcome Measure  Difficulty turning over in bed (including adjusting bedclothes, sheets and blankets)?: A Little Difficulty moving from lying on back to sitting on the side of the bed? : A Little Difficulty sitting down on and standing up from a chair with arms (e.g., wheelchair, bedside commode, etc,.)?: A Little Help  needed moving to and from a bed to chair (including a wheelchair)?: A Little Help needed walking in hospital room?: A Little Help needed climbing 3-5 steps with a railing? : A Little 6 Click Score: 18    End of Session Equipment Utilized During Treatment: Gait belt Activity Tolerance: Patient tolerated treatment well Patient left: in chair;with call bell/phone within reach;with family/visitor present Nurse Communication: Mobility status PT Visit Diagnosis: Unsteadiness on feet (R26.81);Other abnormalities of gait and mobility (R26.89);Pain Pain - Right/Left: Left Pain - part of body: Hip     Time: 1153-1220 PT Time Calculation (min) (ACUTE ONLY): 27 min  Charges:  $Gait Training: 8-22 mins $Therapeutic Exercise: 8-22 mins                    G Codes:       Governor Rooks, PTA pager [phone]    Cristela Blue 10/19/2017, 3:02 PM

## 2017-10-19 NOTE — Discharge Summary (Signed)
Patient ID: Phyllis Conner MRN: [id number] DOB/AGE: [birth year] 70 y.o.  Admit date: 10/17/2017 Discharge date: 10/19/2017  Admission Diagnoses:  Principal Problem:   Osteoarthritis of left hip Active Problems:   Primary osteoarthritis of left hip   Discharge Diagnoses:  Same  Past Medical History:  Diagnosis Date  . Acid reflux   . Anemia    "when I was younger"  . Aortic stenosis    mild AS 09/14/17 echo  . Arthritis   . CAD S/P percutaneous coronary angioplasty November 2014   Mid LAD 2.5 mm x 12 mm Promus DES (2.70 mm); proximal OM1 - Promus DES 2.75 mm x 16 mm (2.85 mm)  . Diabetes mellitus type 2 in obese ([place])   . Essential hypertension   . Headache   . Hyperlipidemia with target LDL less than 70   . Non-Q wave ST elevation myocardial infarction (STEMI) involving left anterior descending (LAD) coronary artery November 2014   PCI to LAD and circumflex  . Obesity (BMI 30-39.9) 05/05/2013    Surgeries: Procedure(s): TOTAL HIP ARTHROPLASTY ANTERIOR APPROACH on 10/17/2017   Consultants:   Discharged Condition: Improved  Hospital Course: Phyllis Conner is an 72 y.o. female who was admitted 10/17/2017 for operative treatment ofOsteoarthritis of left hip. Patient has severe unremitting pain that affects sleep, daily activities, and work/hobbies. After pre-op clearance the patient was taken to the operating room on 10/17/2017 and underwent  Procedure(s): TOTAL HIP ARTHROPLASTY ANTERIOR APPROACH.    Patient was given perioperative antibiotics:  Anti-infectives (From admission, onward)   Start     Dose/Rate Route Frequency Ordered Stop   10/17/17 1106  ceFAZolin (ANCEF) 2-4 GM/100ML-% IVPB    Note to Pharmacy:  Phyllis Conner   : cabinet override      10/17/17 1106 10/17/17 1313   10/17/17 1100  ceFAZolin (ANCEF) IVPB 2g/100 mL premix     2 g 200 mL/hr over 30 Minutes Intravenous On call to O.R. 10/17/17 1058 10/17/17 1313       Patient was given sequential compression  devices, early ambulation, and chemoprophylaxis to prevent DVT.  Patient benefited maximally from hospital stay and there were no complications.    Recent vital signs:  Patient Vitals for the past 24 hrs:  BP Temp Temp src Pulse Resp SpO2  10/19/17 0554 (!) 165/85 98 F (36.7 C) Oral 61 - 100 %  10/18/17 2044 (!) 142/67 (!) 97.4 F (36.3 C) Oral 71 - 100 %  10/18/17 1335 118/74 (!) 97.3 F (36.3 C) Oral 70 18 99 %  10/18/17 1007 (!) 159/73 - - 68 - -     Recent laboratory studies:  Recent Labs    10/18/17 0355 10/19/17 0456  WBC 9.4 10.3  HGB 9.4* 8.8*  HCT 30.7* 28.1*  PLT 177 185  NA 137  --   K 4.1  --   CL 104  --   CO2 21*  --   BUN 16  --   CREATININE 1.23*  --   GLUCOSE 279*  --   CALCIUM 8.7*  --      Discharge Medications:   Allergies as of 10/19/2017      Reactions   Iohexol Hives, Other (See Comments)   Excessive sweating      Medication List    STOP taking these medications   acetaminophen 500 MG tablet Commonly known as:  TYLENOL   ibuprofen 200 MG tablet Commonly known as:  ADVIL,MOTRIN     TAKE these  medications   ACCU-CHEK AVIVA PLUS test strip Generic drug:  glucose blood 1 each by Other route 2 (two) times daily.   amLODipine 5 MG tablet Commonly known as:  NORVASC TAKE 1 TABLET BY MOUTH DAILY   aspirin EC 81 MG tablet Take 1 tablet (81 mg total) by mouth 2 (two) times daily.   atorvastatin 80 MG tablet Commonly known as:  LIPITOR TAKE 1 TABLET BY MOUTH DAILY AT 6 PM   BRILINTA 60 MG Tabs tablet Generic drug:  ticagrelor Take 60 mg by mouth 2 (two) times daily.   HUMALOG 100 UNIT/ML injection Generic drug:  insulin lispro Inject 20 Units into the skin continuous.   ICY HOT MEDICATED SPRAY EX Apply 1 spray topically daily as needed (pain).   isosorbide mononitrate 30 MG 24 hr tablet Commonly known as:  IMDUR TAKE 1 TABLET BY MOUTH EVERY DAY   metFORMIN 500 MG tablet Commonly known as:  GLUCOPHAGE Take 1 tablet (500  mg total) by mouth daily. What changed:  when to take this   metoprolol tartrate 25 MG tablet Commonly known as:  LOPRESSOR Take 1 tablet (25 mg total) by mouth 2 (two) times daily.   nitroGLYCERIN 0.4 MG SL tablet Commonly known as:  NITROSTAT Place 1 tablet (0.4 mg total) under the tongue every 5 (five) minutes x 3 doses as needed for chest pain.   oxyCODONE-acetaminophen 5-325 MG tablet Commonly known as:  PERCOCET/ROXICET Take 1 tablet by mouth every 4 (four) hours as needed for severe pain.   potassium chloride SA 20 MEQ tablet Commonly known as:  K-DUR,KLOR-CON Take 20 mEq by mouth daily.   tiZANidine 2 MG tablet Commonly known as:  ZANAFLEX Take 1 tablet (2 mg total) by mouth every 6 (six) hours as needed.   V-GO 20 Kit Humalog            Durable Medical Equipment  (From admission, onward)        Start     Ordered   10/17/17 1629  DME Walker rolling  Once    Question:  Patient needs a walker to treat with the following condition  Answer:  Status post total hip replacement, left   10/17/17 1628   10/17/17 1629  DME 3 n 1  Once     10/17/17 1628       Discharge Care Instructions  (From admission, onward)        Start     Ordered   10/19/17 0000  Weight bearing as tolerated     10/19/17 0739      Diagnostic Studies: Dg Chest 2 View  Result Date: 10/09/2017 CLINICAL DATA:  Preoperative examination for hip surgery EXAM: CHEST - 2 VIEW COMPARISON:  02/15/2013 chest radiograph. FINDINGS: Stable cardiomediastinal silhouette with normal heart size. No pneumothorax. No pleural effusion. Lungs appear clear, with no acute consolidative airspace disease and no pulmonary edema. IMPRESSION: No active cardiopulmonary disease. Electronically Signed   By: Ilona Sorrel M.D.   On: 10/09/2017 08:10   Dg C-arm 1-60 Min  Result Date: 10/17/2017 CLINICAL DATA:  Status post anterior approach left total hip arthroplasty. EXAM: DG C-ARM 61-120 MIN; OPERATIVE LEFT HIP WITH  PELVIS COMPARISON:  Preoperative study of December 14, 2016 FINDINGS: Reported fluoro time is 13 seconds. 2 fluoro spot images reveal the presence of a left hip joint prosthesis. Radiographic positioning of the prosthetic components is good. The interface with the native bone appears normal. No acute native bone fracture is observed.  IMPRESSION: No immediate complication following left total hip joint prosthesis placement. Electronically Signed   By: David  Martinique M.D.   On: 10/17/2017 14:28   Dg Hip Operative Unilat W Or W/o Pelvis Left  Result Date: 10/17/2017 CLINICAL DATA:  Status post anterior approach left total hip arthroplasty. EXAM: DG C-ARM 61-120 MIN; OPERATIVE LEFT HIP WITH PELVIS COMPARISON:  Preoperative study of December 14, 2016 FINDINGS: Reported fluoro time is 13 seconds. 2 fluoro spot images reveal the presence of a left hip joint prosthesis. Radiographic positioning of the prosthetic components is good. The interface with the native bone appears normal. No acute native bone fracture is observed. IMPRESSION: No immediate complication following left total hip joint prosthesis placement. Electronically Signed   By: David  Martinique M.D.   On: 10/17/2017 14:28    Disposition: Discharge disposition: 01-Home or Self Care       Discharge Instructions    Call MD / Call 911   Complete by:  As directed    If you experience chest pain or shortness of breath, CALL 911 and be transported to the hospital emergency room.  If you develope a fever above 101 F, pus (white drainage) or increased drainage or redness at the wound, or calf pain, call your surgeon's office.   Constipation Prevention   Complete by:  As directed    Drink plenty of fluids.  Prune juice may be helpful.  You may use a stool softener, such as Colace (over the counter) 100 mg twice a day.  Use MiraLax (over the counter) for constipation as needed.   Diet - low sodium heart healthy   Complete by:  As directed    Driving  restrictions   Complete by:  As directed    No driving for 2 weeks   Increase activity slowly as tolerated   Complete by:  As directed    Patient may shower   Complete by:  As directed    You may shower without a dressing once there is no drainage.  Do not wash over the wound.  If drainage remains, cover wound with plastic wrap and then shower.   Weight bearing as tolerated   Complete by:  As directed       Follow-up Information    [name], MD In 2 weeks.   Specialty:  Orthopedic Surgery Contact information: [place] [id number] [phone]            Signed: Joanell Conner 10/19/2017, 7:39 AM

## 2017-10-20 DIAGNOSIS — K219 Gastro-esophageal reflux disease without esophagitis: Secondary | ICD-10-CM | POA: Diagnosis not present

## 2017-10-20 DIAGNOSIS — Z79891 Long term (current) use of opiate analgesic: Secondary | ICD-10-CM | POA: Diagnosis not present

## 2017-10-20 DIAGNOSIS — N289 Disorder of kidney and ureter, unspecified: Secondary | ICD-10-CM | POA: Diagnosis not present

## 2017-10-20 DIAGNOSIS — Z471 Aftercare following joint replacement surgery: Secondary | ICD-10-CM | POA: Diagnosis not present

## 2017-10-20 DIAGNOSIS — Z7982 Long term (current) use of aspirin: Secondary | ICD-10-CM | POA: Diagnosis not present

## 2017-10-20 DIAGNOSIS — E785 Hyperlipidemia, unspecified: Secondary | ICD-10-CM | POA: Diagnosis not present

## 2017-10-20 DIAGNOSIS — I350 Nonrheumatic aortic (valve) stenosis: Secondary | ICD-10-CM | POA: Diagnosis not present

## 2017-10-20 DIAGNOSIS — Z9181 History of falling: Secondary | ICD-10-CM | POA: Diagnosis not present

## 2017-10-20 DIAGNOSIS — D649 Anemia, unspecified: Secondary | ICD-10-CM | POA: Diagnosis not present

## 2017-10-20 DIAGNOSIS — I2510 Atherosclerotic heart disease of native coronary artery without angina pectoris: Secondary | ICD-10-CM | POA: Diagnosis not present

## 2017-10-20 DIAGNOSIS — Z96642 Presence of left artificial hip joint: Secondary | ICD-10-CM | POA: Diagnosis not present

## 2017-10-20 DIAGNOSIS — I252 Old myocardial infarction: Secondary | ICD-10-CM | POA: Diagnosis not present

## 2017-10-20 DIAGNOSIS — Z794 Long term (current) use of insulin: Secondary | ICD-10-CM | POA: Diagnosis not present

## 2017-10-20 DIAGNOSIS — E119 Type 2 diabetes mellitus without complications: Secondary | ICD-10-CM | POA: Diagnosis not present

## 2017-10-20 DIAGNOSIS — I10 Essential (primary) hypertension: Secondary | ICD-10-CM | POA: Diagnosis not present

## 2017-10-20 DIAGNOSIS — I951 Orthostatic hypotension: Secondary | ICD-10-CM | POA: Diagnosis not present

## 2017-10-22 DIAGNOSIS — E785 Hyperlipidemia, unspecified: Secondary | ICD-10-CM | POA: Diagnosis not present

## 2017-10-22 DIAGNOSIS — Z79891 Long term (current) use of opiate analgesic: Secondary | ICD-10-CM | POA: Diagnosis not present

## 2017-10-22 DIAGNOSIS — I2510 Atherosclerotic heart disease of native coronary artery without angina pectoris: Secondary | ICD-10-CM | POA: Diagnosis not present

## 2017-10-22 DIAGNOSIS — I350 Nonrheumatic aortic (valve) stenosis: Secondary | ICD-10-CM | POA: Diagnosis not present

## 2017-10-22 DIAGNOSIS — I10 Essential (primary) hypertension: Secondary | ICD-10-CM | POA: Diagnosis not present

## 2017-10-22 DIAGNOSIS — D649 Anemia, unspecified: Secondary | ICD-10-CM | POA: Diagnosis not present

## 2017-10-22 DIAGNOSIS — Z471 Aftercare following joint replacement surgery: Secondary | ICD-10-CM | POA: Diagnosis not present

## 2017-10-22 DIAGNOSIS — K219 Gastro-esophageal reflux disease without esophagitis: Secondary | ICD-10-CM | POA: Diagnosis not present

## 2017-10-22 DIAGNOSIS — I951 Orthostatic hypotension: Secondary | ICD-10-CM | POA: Diagnosis not present

## 2017-10-22 DIAGNOSIS — Z9181 History of falling: Secondary | ICD-10-CM | POA: Diagnosis not present

## 2017-10-22 DIAGNOSIS — Z7982 Long term (current) use of aspirin: Secondary | ICD-10-CM | POA: Diagnosis not present

## 2017-10-22 DIAGNOSIS — Z96642 Presence of left artificial hip joint: Secondary | ICD-10-CM | POA: Diagnosis not present

## 2017-10-22 DIAGNOSIS — Z794 Long term (current) use of insulin: Secondary | ICD-10-CM | POA: Diagnosis not present

## 2017-10-22 DIAGNOSIS — N289 Disorder of kidney and ureter, unspecified: Secondary | ICD-10-CM | POA: Diagnosis not present

## 2017-10-22 DIAGNOSIS — I252 Old myocardial infarction: Secondary | ICD-10-CM | POA: Diagnosis not present

## 2017-10-22 DIAGNOSIS — E119 Type 2 diabetes mellitus without complications: Secondary | ICD-10-CM | POA: Diagnosis not present

## 2017-10-24 ENCOUNTER — Ambulatory Visit: Payer: Medicare Other | Admitting: Cardiology

## 2017-10-25 DIAGNOSIS — I252 Old myocardial infarction: Secondary | ICD-10-CM | POA: Diagnosis not present

## 2017-10-25 DIAGNOSIS — Z79891 Long term (current) use of opiate analgesic: Secondary | ICD-10-CM | POA: Diagnosis not present

## 2017-10-25 DIAGNOSIS — N289 Disorder of kidney and ureter, unspecified: Secondary | ICD-10-CM | POA: Diagnosis not present

## 2017-10-25 DIAGNOSIS — E785 Hyperlipidemia, unspecified: Secondary | ICD-10-CM | POA: Diagnosis not present

## 2017-10-25 DIAGNOSIS — I10 Essential (primary) hypertension: Secondary | ICD-10-CM | POA: Diagnosis not present

## 2017-10-25 DIAGNOSIS — I350 Nonrheumatic aortic (valve) stenosis: Secondary | ICD-10-CM | POA: Diagnosis not present

## 2017-10-25 DIAGNOSIS — Z9181 History of falling: Secondary | ICD-10-CM | POA: Diagnosis not present

## 2017-10-25 DIAGNOSIS — Z471 Aftercare following joint replacement surgery: Secondary | ICD-10-CM | POA: Diagnosis not present

## 2017-10-25 DIAGNOSIS — D649 Anemia, unspecified: Secondary | ICD-10-CM | POA: Diagnosis not present

## 2017-10-25 DIAGNOSIS — I2510 Atherosclerotic heart disease of native coronary artery without angina pectoris: Secondary | ICD-10-CM | POA: Diagnosis not present

## 2017-10-25 DIAGNOSIS — Z7982 Long term (current) use of aspirin: Secondary | ICD-10-CM | POA: Diagnosis not present

## 2017-10-25 DIAGNOSIS — I951 Orthostatic hypotension: Secondary | ICD-10-CM | POA: Diagnosis not present

## 2017-10-25 DIAGNOSIS — Z794 Long term (current) use of insulin: Secondary | ICD-10-CM | POA: Diagnosis not present

## 2017-10-25 DIAGNOSIS — K219 Gastro-esophageal reflux disease without esophagitis: Secondary | ICD-10-CM | POA: Diagnosis not present

## 2017-10-25 DIAGNOSIS — E119 Type 2 diabetes mellitus without complications: Secondary | ICD-10-CM | POA: Diagnosis not present

## 2017-10-25 DIAGNOSIS — Z96642 Presence of left artificial hip joint: Secondary | ICD-10-CM | POA: Diagnosis not present

## 2017-10-26 DIAGNOSIS — K219 Gastro-esophageal reflux disease without esophagitis: Secondary | ICD-10-CM | POA: Diagnosis not present

## 2017-10-26 DIAGNOSIS — I252 Old myocardial infarction: Secondary | ICD-10-CM | POA: Diagnosis not present

## 2017-10-26 DIAGNOSIS — Z471 Aftercare following joint replacement surgery: Secondary | ICD-10-CM | POA: Diagnosis not present

## 2017-10-26 DIAGNOSIS — Z9181 History of falling: Secondary | ICD-10-CM | POA: Diagnosis not present

## 2017-10-26 DIAGNOSIS — I2510 Atherosclerotic heart disease of native coronary artery without angina pectoris: Secondary | ICD-10-CM | POA: Diagnosis not present

## 2017-10-26 DIAGNOSIS — Z79891 Long term (current) use of opiate analgesic: Secondary | ICD-10-CM | POA: Diagnosis not present

## 2017-10-26 DIAGNOSIS — D649 Anemia, unspecified: Secondary | ICD-10-CM | POA: Diagnosis not present

## 2017-10-26 DIAGNOSIS — Z794 Long term (current) use of insulin: Secondary | ICD-10-CM | POA: Diagnosis not present

## 2017-10-26 DIAGNOSIS — I951 Orthostatic hypotension: Secondary | ICD-10-CM | POA: Diagnosis not present

## 2017-10-26 DIAGNOSIS — Z7982 Long term (current) use of aspirin: Secondary | ICD-10-CM | POA: Diagnosis not present

## 2017-10-26 DIAGNOSIS — I350 Nonrheumatic aortic (valve) stenosis: Secondary | ICD-10-CM | POA: Diagnosis not present

## 2017-10-26 DIAGNOSIS — E785 Hyperlipidemia, unspecified: Secondary | ICD-10-CM | POA: Diagnosis not present

## 2017-10-26 DIAGNOSIS — E119 Type 2 diabetes mellitus without complications: Secondary | ICD-10-CM | POA: Diagnosis not present

## 2017-10-26 DIAGNOSIS — N289 Disorder of kidney and ureter, unspecified: Secondary | ICD-10-CM | POA: Diagnosis not present

## 2017-10-26 DIAGNOSIS — Z96642 Presence of left artificial hip joint: Secondary | ICD-10-CM | POA: Diagnosis not present

## 2017-10-26 DIAGNOSIS — I10 Essential (primary) hypertension: Secondary | ICD-10-CM | POA: Diagnosis not present

## 2017-10-29 DIAGNOSIS — I2510 Atherosclerotic heart disease of native coronary artery without angina pectoris: Secondary | ICD-10-CM | POA: Diagnosis not present

## 2017-10-29 DIAGNOSIS — I951 Orthostatic hypotension: Secondary | ICD-10-CM | POA: Diagnosis not present

## 2017-10-29 DIAGNOSIS — Z79891 Long term (current) use of opiate analgesic: Secondary | ICD-10-CM | POA: Diagnosis not present

## 2017-10-29 DIAGNOSIS — I10 Essential (primary) hypertension: Secondary | ICD-10-CM | POA: Diagnosis not present

## 2017-10-29 DIAGNOSIS — N289 Disorder of kidney and ureter, unspecified: Secondary | ICD-10-CM | POA: Diagnosis not present

## 2017-10-29 DIAGNOSIS — K219 Gastro-esophageal reflux disease without esophagitis: Secondary | ICD-10-CM | POA: Diagnosis not present

## 2017-10-29 DIAGNOSIS — I252 Old myocardial infarction: Secondary | ICD-10-CM | POA: Diagnosis not present

## 2017-10-29 DIAGNOSIS — E119 Type 2 diabetes mellitus without complications: Secondary | ICD-10-CM | POA: Diagnosis not present

## 2017-10-29 DIAGNOSIS — Z7982 Long term (current) use of aspirin: Secondary | ICD-10-CM | POA: Diagnosis not present

## 2017-10-29 DIAGNOSIS — Z794 Long term (current) use of insulin: Secondary | ICD-10-CM | POA: Diagnosis not present

## 2017-10-29 DIAGNOSIS — Z9181 History of falling: Secondary | ICD-10-CM | POA: Diagnosis not present

## 2017-10-29 DIAGNOSIS — Z471 Aftercare following joint replacement surgery: Secondary | ICD-10-CM | POA: Diagnosis not present

## 2017-10-29 DIAGNOSIS — D649 Anemia, unspecified: Secondary | ICD-10-CM | POA: Diagnosis not present

## 2017-10-29 DIAGNOSIS — E785 Hyperlipidemia, unspecified: Secondary | ICD-10-CM | POA: Diagnosis not present

## 2017-10-29 DIAGNOSIS — Z96642 Presence of left artificial hip joint: Secondary | ICD-10-CM | POA: Diagnosis not present

## 2017-10-29 DIAGNOSIS — I350 Nonrheumatic aortic (valve) stenosis: Secondary | ICD-10-CM | POA: Diagnosis not present

## 2017-10-30 DIAGNOSIS — M1612 Unilateral primary osteoarthritis, left hip: Secondary | ICD-10-CM | POA: Diagnosis not present

## 2017-11-01 DIAGNOSIS — I10 Essential (primary) hypertension: Secondary | ICD-10-CM | POA: Diagnosis not present

## 2017-11-01 DIAGNOSIS — N289 Disorder of kidney and ureter, unspecified: Secondary | ICD-10-CM | POA: Diagnosis not present

## 2017-11-01 DIAGNOSIS — Z96642 Presence of left artificial hip joint: Secondary | ICD-10-CM | POA: Diagnosis not present

## 2017-11-01 DIAGNOSIS — I951 Orthostatic hypotension: Secondary | ICD-10-CM | POA: Diagnosis not present

## 2017-11-01 DIAGNOSIS — Z7982 Long term (current) use of aspirin: Secondary | ICD-10-CM | POA: Diagnosis not present

## 2017-11-01 DIAGNOSIS — I350 Nonrheumatic aortic (valve) stenosis: Secondary | ICD-10-CM | POA: Diagnosis not present

## 2017-11-01 DIAGNOSIS — E119 Type 2 diabetes mellitus without complications: Secondary | ICD-10-CM | POA: Diagnosis not present

## 2017-11-01 DIAGNOSIS — Z79891 Long term (current) use of opiate analgesic: Secondary | ICD-10-CM | POA: Diagnosis not present

## 2017-11-01 DIAGNOSIS — E785 Hyperlipidemia, unspecified: Secondary | ICD-10-CM | POA: Diagnosis not present

## 2017-11-01 DIAGNOSIS — I252 Old myocardial infarction: Secondary | ICD-10-CM | POA: Diagnosis not present

## 2017-11-01 DIAGNOSIS — K219 Gastro-esophageal reflux disease without esophagitis: Secondary | ICD-10-CM | POA: Diagnosis not present

## 2017-11-01 DIAGNOSIS — Z9181 History of falling: Secondary | ICD-10-CM | POA: Diagnosis not present

## 2017-11-01 DIAGNOSIS — D649 Anemia, unspecified: Secondary | ICD-10-CM | POA: Diagnosis not present

## 2017-11-01 DIAGNOSIS — I2510 Atherosclerotic heart disease of native coronary artery without angina pectoris: Secondary | ICD-10-CM | POA: Diagnosis not present

## 2017-11-01 DIAGNOSIS — Z794 Long term (current) use of insulin: Secondary | ICD-10-CM | POA: Diagnosis not present

## 2017-11-01 DIAGNOSIS — Z471 Aftercare following joint replacement surgery: Secondary | ICD-10-CM | POA: Diagnosis not present

## 2017-11-02 DIAGNOSIS — Z79891 Long term (current) use of opiate analgesic: Secondary | ICD-10-CM | POA: Diagnosis not present

## 2017-11-02 DIAGNOSIS — D649 Anemia, unspecified: Secondary | ICD-10-CM | POA: Diagnosis not present

## 2017-11-02 DIAGNOSIS — E119 Type 2 diabetes mellitus without complications: Secondary | ICD-10-CM | POA: Diagnosis not present

## 2017-11-02 DIAGNOSIS — N289 Disorder of kidney and ureter, unspecified: Secondary | ICD-10-CM | POA: Diagnosis not present

## 2017-11-02 DIAGNOSIS — Z794 Long term (current) use of insulin: Secondary | ICD-10-CM | POA: Diagnosis not present

## 2017-11-02 DIAGNOSIS — I252 Old myocardial infarction: Secondary | ICD-10-CM | POA: Diagnosis not present

## 2017-11-02 DIAGNOSIS — I951 Orthostatic hypotension: Secondary | ICD-10-CM | POA: Diagnosis not present

## 2017-11-02 DIAGNOSIS — E785 Hyperlipidemia, unspecified: Secondary | ICD-10-CM | POA: Diagnosis not present

## 2017-11-02 DIAGNOSIS — Z7982 Long term (current) use of aspirin: Secondary | ICD-10-CM | POA: Diagnosis not present

## 2017-11-02 DIAGNOSIS — Z9181 History of falling: Secondary | ICD-10-CM | POA: Diagnosis not present

## 2017-11-02 DIAGNOSIS — I10 Essential (primary) hypertension: Secondary | ICD-10-CM | POA: Diagnosis not present

## 2017-11-02 DIAGNOSIS — Z471 Aftercare following joint replacement surgery: Secondary | ICD-10-CM | POA: Diagnosis not present

## 2017-11-02 DIAGNOSIS — Z96642 Presence of left artificial hip joint: Secondary | ICD-10-CM | POA: Diagnosis not present

## 2017-11-02 DIAGNOSIS — I350 Nonrheumatic aortic (valve) stenosis: Secondary | ICD-10-CM | POA: Diagnosis not present

## 2017-11-02 DIAGNOSIS — I2510 Atherosclerotic heart disease of native coronary artery without angina pectoris: Secondary | ICD-10-CM | POA: Diagnosis not present

## 2017-11-02 DIAGNOSIS — K219 Gastro-esophageal reflux disease without esophagitis: Secondary | ICD-10-CM | POA: Diagnosis not present

## 2017-11-08 ENCOUNTER — Ambulatory Visit (HOSPITAL_COMMUNITY): Payer: Medicare Other | Attending: Orthopedic Surgery | Admitting: Physical Therapy

## 2017-11-08 ENCOUNTER — Encounter (HOSPITAL_COMMUNITY): Payer: Self-pay | Admitting: Physical Therapy

## 2017-11-08 ENCOUNTER — Other Ambulatory Visit: Payer: Self-pay

## 2017-11-08 DIAGNOSIS — M25552 Pain in left hip: Secondary | ICD-10-CM | POA: Diagnosis not present

## 2017-11-08 DIAGNOSIS — R29898 Other symptoms and signs involving the musculoskeletal system: Secondary | ICD-10-CM | POA: Diagnosis not present

## 2017-11-08 DIAGNOSIS — M6281 Muscle weakness (generalized): Secondary | ICD-10-CM | POA: Insufficient documentation

## 2017-11-08 DIAGNOSIS — R269 Unspecified abnormalities of gait and mobility: Secondary | ICD-10-CM | POA: Insufficient documentation

## 2017-11-08 DIAGNOSIS — R2689 Other abnormalities of gait and mobility: Secondary | ICD-10-CM

## 2017-11-08 DIAGNOSIS — M1612 Unilateral primary osteoarthritis, left hip: Secondary | ICD-10-CM | POA: Diagnosis not present

## 2017-11-08 NOTE — Therapy (Signed)
[place], [place], [address] Phone: [phone]   Fax:  [phone]  Physical Therapy Evaluation  Patient Details  Name: Phyllis Conner MRN: [id number] Date of Birth: [birth year] Referring Provider: [name], MD   Encounter Date: 11/08/2017  PT End of Session - 11/08/17 1535    Visit Number  1    Number of Visits  19    Date for PT Re-Evaluation  12/21/17 Mini re-assess 11/29/17    Authorization Type  Primary: U[employer] Medicare; secondary: Medicaid    Authorization Time Period  11/08/17 - 12/21/17    Authorization - Visit Number  1    Authorization - Number of Visits  10    PT Start Time  1123    PT Stop Time  1200    PT Time Calculation (min)  37 min    Equipment Utilized During Treatment  Gait belt    Activity Tolerance  Patient tolerated treatment well    Behavior During Therapy  WFL for tasks assessed/performed       Past Medical History:  Diagnosis Date  . Acid reflux   . Anemia    "when I was younger"  . Aortic stenosis    mild AS 09/14/17 echo  . Arthritis   . CAD S/P percutaneous coronary angioplasty November 2014   Mid LAD 2.5 mm x 12 mm Promus DES (2.70 mm); proximal OM1 - Promus DES 2.75 mm x 16 mm (2.85 mm)  . Diabetes mellitus type 2 in obese ([place])   . Essential hypertension   . Headache   . Hyperlipidemia with target LDL less than 70   . Non-Q wave ST elevation myocardial infarction (STEMI) involving left anterior descending (LAD) coronary artery November 2014   PCI to LAD and circumflex  . Obesity (BMI 30-39.9) 05/05/2013    Past Surgical History:  Procedure Laterality Date  . ABDOMINAL HYSTERECTOMY  1990s  . Arthroscopic knee surgery Bilateral 1984, 2002   Arthroscopic  . CARDIAC CATHETERIZATION  November 2014   Mid LAD 95%, proximal OM1 80-90% --> two-vessel PCI  . COLONOSCOPY N/A 12/05/2013   Procedure: COLONOSCOPY;  Surgeon: Beryle Beams, MD;  Location: [facility];  Service:  Endoscopy;  Laterality: N/A;  . COLONOSCOPY WITH PROPOFOL N/A 12/11/2014   Procedure: COLONOSCOPY WITH PROPOFOL;  Surgeon: Carol Ada, MD;  Location: [facility];  Service: Endoscopy;  Laterality: N/A;  . ESOPHAGOGASTRODUODENOSCOPY N/A 12/05/2013   Procedure: ESOPHAGOGASTRODUODENOSCOPY (EGD);  Surgeon: Beryle Beams, MD;  Location: [facility];  Service: Endoscopy;  Laterality: N/A;  . LEFT HEART CATHETERIZATION WITH CORONARY ANGIOGRAM N/A 02/17/2013   Procedure: LEFT HEART CATHETERIZATION WITH CORONARY ANGIOGRAM;  Surgeon: Sanda Klein, MD;  Location: [facility];  Service: Cardiovascular;  Laterality: N/A;  . PERCUTANEOUS CORONARY STENT INTERVENTION (PCI-S)  November 2014   Mid LAD 2.5 mm x 12 mm (2.75 mm) Promus DES; OM1 2.75 mm x 60 mm (2.85 mm) Promus DES  . TOTAL HIP ARTHROPLASTY Left 10/17/2017  . TOTAL HIP ARTHROPLASTY Left 10/17/2017   Procedure: TOTAL HIP ARTHROPLASTY ANTERIOR APPROACH;  Surgeon: [name], MD;  Location: [facility];  Service: Orthopedics;  Laterality: Left;  . TRANSTHORACIC ECHOCARDIOGRAM  02/16/2013   EF 40-50%. Moderate concentric LVH. Moderate H. Shea of mid-distal septal and apical region. Grade 1 diastolic dysfunction. Moderate aortic sclerosis without stenosis.    There were no vitals filed for this visit.   Subjective Assessment - 11/08/17 1131    Subjective  Patient reported having a left total hip replacement on 10/17/17 via anterior approach. Patient denied any complications following surgery. Patient reporting that she had home health physical therapy until 11/02/17. Patient reported that she was walking with a cane before the surgery, but is now walking with the rolling walker. Patient reported that before the surgery she was able to walk short distances and around her home. Patient reported that she has some numbness from the surgery on lateral left thigh. Patient denied any changes in her bowel and bladder function since the surgery. Patient reported no  pain currently, but stated her pain can get up to an 8/10 throbbing pain over the last week. Patient reported that she is not supposed to lift anything heavy and the therapist from home health told her not to abduct her left leg.     Pertinent History  History of arthroscopic knee surgery; Left total hip replacement 10/17/17    Limitations  Standing;Walking;House hold activities;Sitting    How long can you sit comfortably?  20-25 minutes    How long can you stand comfortably?  10 minutes    How long can you walk comfortably?  15-20 minutes    Diagnostic tests  12/14/16 X-ray left hip: Severe asymmetric osteoarthritis of the left hip.    Patient Stated Goals  To walk normal     Currently in Pain?  No/denies         [facility] PT Assessment - 11/08/17 0001      Assessment   Medical Diagnosis  S/P Left Total hip Arthroplasty    Referring Provider  [name], MD    Onset Date/Surgical Date  10/17/17    Next MD Visit  11/27/17    Prior Therapy  Yes for hip before surgery      Precautions   Precautions  Anterior Hip      Restrictions   Weight Bearing Restrictions  Yes    LLE Weight Bearing  Weight bearing as tolerated      Balance Screen   Has the patient fallen in the past 6 months  No    Has the patient had a decrease in activity level because of a fear of falling?   Yes    Is the patient reluctant to leave their home because of a fear of falling?   No      Home Environment   Living Environment  Private residence    Living Arrangements  Children    Type of [place] to enter    Entrance Stairs-Number of Steps  1    Entrance Stairs-Rails  None    Home Layout  Able to live on main level with bedroom/bathroom;Two level    Alternate Level Stairs-Number of Steps  11    Alternate Level Stairs-Rails  Can reach both    [place] - 2 wheels;Cane - single point      Prior Function   Level of Independence  Independent with basic ADLs;Independent with  household mobility with device    Vocation  Retired      Associate Professor   Overall Cognitive Status  Within Functional Limits for tasks assessed      Observation/Other Assessments   Focus on Therapeutic Outcomes (FOTO)   34% (66% limited)      ROM / Strength   AROM / PROM / Strength  Strength      AROM   AROM Assessment Site  Hip  Right/Left Hip  Right;Left    Right Hip Flexion  110    Left Hip Flexion  90      Strength   Strength Assessment Site  Hip;Knee;Ankle    Right/Left Hip  Right;Left    Right Hip Flexion  4+/5    Right Hip Extension  -- Able to withstand maximal pressure in standing    Right Hip ABduction  4+/5    Left Hip Flexion  4-/5    Left Hip Extension  -- Able to withstand 50% pressure in standing    Left Hip ABduction  3+/5    Right/Left Knee  Right;Left    Right Knee Flexion  5/5    Right Knee Extension  5/5    Left Knee Flexion  4+/5    Left Knee Extension  4+/5    Right/Left Ankle  Right;Left    Right Ankle Dorsiflexion  5/5    Left Ankle Dorsiflexion  4+/5      Ambulation/Gait   Ambulation/Gait  Yes    Ambulation Distance (Feet)  198 Feet 2MWT    Assistive device  Rolling walker    Gait Pattern  Decreased hip/knee flexion - left;Decreased stance time - left;Antalgic    Ambulation Surface  Level    Gait velocity  0.53 m/s      Static Standing Balance   Static Standing - Balance Support  No upper extremity supported    Static Standing Balance -  Activities   Single Leg Stance - Right Leg;Single Leg Stance - Left Leg    Static Standing - Comment/# of Minutes  0 seconds on each lower extremity      Standardized Balance Assessment   Standardized Balance Assessment  Five Times Sit to Stand    Five times sit to stand comments   30.16 seconds with stabilization of arms on thighs                Objective measurements completed on examination: See above findings.              PT Education - 11/08/17 1535    Education Details   Educated patient on examination findings, plan of care, and to continue exercises issued from home health therapy.     Person(s) Educated  Patient    Methods  Explanation    Comprehension  Verbalized understanding       PT Short Term Goals - 11/08/17 1539      PT SHORT TERM GOAL #1   Title  Patient will demonstrate understanding and report regular compliance of HEP in order to improve functional mobility.     Time  3    Period  Weeks    Status  New    Target Date  11/29/17      PT SHORT TERM GOAL #2   Title  Patient will demonstrate ability to perform single limb stance for 10 seconds on each lower extremity to improve safety and assist with ambulation and stair navigation.     Time  3    Period  Weeks    Status  New    Target Date  11/29/17      PT SHORT TERM GOAL #3   Title  Patient will demonstrate improvement of 1/2 grade on MMT strength testing in all deficient muscle groups except hip extension in which patient will be able to maintain full resistance in standing, in order to improve patient's ability to ambulate with improved mechanics.  Time  3    Period  Weeks    Status  New    Target Date  11/29/17        PT Long Term Goals - 11/08/17 1542      PT LONG TERM GOAL #1   Title  Patient will demonstrate left hip AROM within functional limits to assist with ambulation mechanics and safety.     Time  6    Period  Weeks    Status  New    Target Date  12/20/17      PT LONG TERM GOAL #2   Title  Patient will demonstrate improvement of 1 grade on MMT strength testing in all deficient muscle groups except hip extension in which patient will be able to maintain full resistance in standing, in order to improve patient's ability to ambulate with improved mechanics.     Time  6    Period  Weeks    Status  New    Target Date  12/20/17      PT LONG TERM GOAL #3   Title  Patient will demonstrate ability to ambulate at a gait velocity of 0.8 m/s with LRAD indicating  improved safety with household ambulation.     Time  6    Period  Weeks    Status  New    Target Date  12/20/17      PT LONG TERM GOAL #4   Title  Patient will demonstrate an improvement of 10% on FOTO indicating an improvement in percieved functional mobility.     Time  6    Period  Weeks    Status  New    Target Date  12/20/17      PT LONG TERM GOAL #5   Title  Patient will demonstrate improvement of 10 seconds on the 5xSTS test indicating improved balance and overall functional mobility.     Time  6    Period  Weeks    Status  New    Target Date  12/20/17             Plan - 11/08/17 1555    Clinical Impression Statement  Patient is a 72 year old female who presented to physical therapy status post left total hip arthroplasty on 10/17/17. Patient reported a decrease in functional mobility and stated that before the surgery she was ambulating with a cane and now requires a walker to ambulate. Upon examination, noted decreased left hip ROM, lower extremity weakness. Patient demonstrated decreased balance with single limb stance assessment and with 5xSTS test. With ambulation patient demonstrated decreased gait velocity on the 2MWT and gait deviations were noted. Patient would benefit from skilled physical therapy in order to address the abovementioned deficits and help her return to her prior level of function.     History and Personal Factors relevant to plan of care:  CHF, MI, HTN, DM II, Vertigo, Left THA 10/17/17, history of knee arthroscopy     Clinical Presentation  Stable    Clinical Presentation due to:  FOTO, MMT, ROM, 5xSTS, 2MWT, clinical judgement    Clinical Decision Making  Moderate    Rehab Potential  Fair    Clinical Impairments Affecting Rehab Potential  Positive: Motivated; Negative: comorbidities    PT Frequency  3x / week    PT Duration  6 weeks    PT Treatment/Interventions  ADLs/Self Care Home Management;Aquatic Therapy;DME Instruction;Gait training;Stair  training;Functional mobility training;Therapeutic activities;Therapeutic exercise;Balance training;Neuromuscular re-education;Patient/family education;Orthotic Fit/Training;Manual techniques;Scar  mobilization;Passive range of motion;Dry needling;Energy conservation;Taping    PT Next Visit Plan  Review evaluation and goals, review exercises patient has been doing at home and update as needed, complete AROM measurements of hips within patient's precautions (possible hip abduction precuation per previous home health PT), initiate functional lower extremity strengthening exercises and mobility exercises    PT Home Exercise Plan  Review exercises patient has been performing at home and upadate as needed at first treatment    Consulted and Agree with Plan of Care  Patient       Patient will benefit from skilled therapeutic intervention in order to improve the following deficits and impairments:  Abnormal gait, Decreased balance, Decreased mobility, Difficulty walking, Decreased range of motion, Improper body mechanics, Decreased activity tolerance, Decreased knowledge of use of DME, Decreased strength, Pain  Visit Diagnosis: Muscle weakness (generalized)  Pain in left hip  Other abnormalities of gait and mobility  Other symptoms and signs involving the musculoskeletal system     Problem List Patient Active Problem List   Diagnosis Date Noted  . Primary osteoarthritis of left hip 10/17/2017  . Osteoarthritis of left hip 10/16/2017  . Preop cardiovascular exam 09/10/2017  . Orthopnea 09/10/2017  . Right carotid bruit 10/25/2013  . Diabetes mellitus type 2 in obese ([place])   . Hyperlipidemia with target LDL less than 70   . Orthostatic hypotension 08/28/2013  . Renal insufficiency 08/19/2013  . Obesity (BMI 30-39.9) 05/05/2013  . CAD S/P percutaneous coronary angioplasty -- LAD/OM1 DES 02/17/13 02/18/2013  . Pulmonary embolism August 2011 (syncope) 02/18/2013  . NSTEMI (non-ST elevated  myocardial infarction) ([place]) 02/15/2013  . Essential hypertension 02/15/2013    Clarene Critchley PT, DPT 4:00 PM, 11/08/17 [place] [address] [place], [place], [address] Phone: [phone]   Fax:  [phone]  Name: Phyllis Conner MRN: [id number] Date of Birth: [birth year]

## 2017-11-13 ENCOUNTER — Encounter (HOSPITAL_COMMUNITY): Payer: Self-pay

## 2017-11-13 ENCOUNTER — Ambulatory Visit (HOSPITAL_COMMUNITY): Payer: Medicare Other

## 2017-11-13 DIAGNOSIS — R2689 Other abnormalities of gait and mobility: Secondary | ICD-10-CM | POA: Diagnosis not present

## 2017-11-13 DIAGNOSIS — M25552 Pain in left hip: Secondary | ICD-10-CM

## 2017-11-13 DIAGNOSIS — M1612 Unilateral primary osteoarthritis, left hip: Secondary | ICD-10-CM | POA: Diagnosis not present

## 2017-11-13 DIAGNOSIS — R29898 Other symptoms and signs involving the musculoskeletal system: Secondary | ICD-10-CM | POA: Diagnosis not present

## 2017-11-13 DIAGNOSIS — R269 Unspecified abnormalities of gait and mobility: Secondary | ICD-10-CM | POA: Diagnosis not present

## 2017-11-13 DIAGNOSIS — M6281 Muscle weakness (generalized): Secondary | ICD-10-CM

## 2017-11-13 NOTE — Therapy (Signed)
[place], [place], [address] Phone: [phone]   Fax:  [phone]  Physical Therapy Treatment  Patient Details  Name: Phyllis Conner MRN: [id number] Date of Birth: October 21, 1945 Referring Provider: [name], MD   Encounter Date: 11/13/2017  PT End of Session - 11/13/17 1107    Visit Number  2    Number of Visits  19    Date for PT Re-Evaluation  12/21/17 Mini re-assess 11/29/17    Authorization Type  Primary: UHC Medicare; secondary: Medicaid    Authorization Time Period  11/08/17 - 12/21/17    Authorization - Visit Number  2    Authorization - Number of Visits  10    PT Start Time  2025    PT Stop Time  1130    PT Time Calculation (min)  47 min    Equipment Utilized During Treatment  Gait belt    Activity Tolerance  Patient tolerated treatment well    Behavior During Therapy  WFL for tasks assessed/performed       Past Medical History:  Diagnosis Date  . Acid reflux   . Anemia    "when I was younger"  . Aortic stenosis    mild AS 09/14/17 echo  . Arthritis   . CAD S/P percutaneous coronary angioplasty November 2014   Mid LAD 2.5 mm x 12 mm Promus DES (2.70 mm); proximal OM1 - Promus DES 2.75 mm x 16 mm (2.85 mm)  . Diabetes mellitus type 2 in obese ([place])   . Essential hypertension   . Headache   . Hyperlipidemia with target LDL less than 70   . Non-Q wave ST elevation myocardial infarction (STEMI) involving left anterior descending (LAD) coronary artery November 2014   PCI to LAD and circumflex  . Obesity (BMI 30-39.9) 05/05/2013    Past Surgical History:  Procedure Laterality Date  . ABDOMINAL HYSTERECTOMY  1990s  . Arthroscopic knee surgery Bilateral 1984, 2002   Arthroscopic  . CARDIAC CATHETERIZATION  November 2014   Mid LAD 95%, proximal OM1 80-90% --> two-vessel PCI  . COLONOSCOPY N/A 12/05/2013   Procedure: COLONOSCOPY;  Surgeon: Beryle Beams, MD;  Location: [facility];  Service:  Endoscopy;  Laterality: N/A;  . COLONOSCOPY WITH PROPOFOL N/A 12/11/2014   Procedure: COLONOSCOPY WITH PROPOFOL;  Surgeon: Carol Ada, MD;  Location: [facility];  Service: Endoscopy;  Laterality: N/A;  . ESOPHAGOGASTRODUODENOSCOPY N/A 12/05/2013   Procedure: ESOPHAGOGASTRODUODENOSCOPY (EGD);  Surgeon: Beryle Beams, MD;  Location: [facility];  Service: Endoscopy;  Laterality: N/A;  . LEFT HEART CATHETERIZATION WITH CORONARY ANGIOGRAM N/A 02/17/2013   Procedure: LEFT HEART CATHETERIZATION WITH CORONARY ANGIOGRAM;  Surgeon: Sanda Klein, MD;  Location: [facility];  Service: Cardiovascular;  Laterality: N/A;  . PERCUTANEOUS CORONARY STENT INTERVENTION (PCI-S)  November 2014   Mid LAD 2.5 mm x 12 mm (2.75 mm) Promus DES; OM1 2.75 mm x 60 mm (2.85 mm) Promus DES  . TOTAL HIP ARTHROPLASTY Left 10/17/2017  . TOTAL HIP ARTHROPLASTY Left 10/17/2017   Procedure: TOTAL HIP ARTHROPLASTY ANTERIOR APPROACH;  Surgeon: [name], MD;  Location: [facility];  Service: Orthopedics;  Laterality: Left;  . TRANSTHORACIC ECHOCARDIOGRAM  02/16/2013   EF 40-50%. Moderate concentric LVH. Moderate H. Shea of mid-distal septal and apical region. Grade 1 diastolic dysfunction. Moderate aortic sclerosis without stenosis.    There were no vitals filed for this visit.  Subjective Assessment - 11/13/17 1056    Subjective  No pain to speak of today, just some stiffness. Was told in hospital to not move leg out to the side.    Pertinent History  History of arthroscopic knee surgery; Left total hip replacement 10/17/17    Limitations  Standing;Walking;House hold activities;Sitting    How long can you sit comfortably?  20-25 minutes    How long can you stand comfortably?  10 minutes    How long can you walk comfortably?  15-20 minutes    Diagnostic tests  12/14/16 X-ray left hip: Severe asymmetric osteoarthritis of the left hip.    Patient Stated Goals  To walk normal     Currently in Pain?  No/denies         [facility] PT  Assessment - 11/13/17 0001      AROM   Left Hip External Rotation   20    Left Hip Internal Rotation   16    Left Hip ADduction  10                   [facility] Adult PT Treatment/Exercise - 11/13/17 0001      Exercises   Exercises  Knee/Hip      Knee/Hip Exercises: Standing   Heel Raises  1 set;10 reps    Hip Flexion  Stengthening;Both;1 set;10 reps    Hip Extension  Stengthening;Left;1 set;10 reps    Functional Squat  1 set;10 reps      Knee/Hip Exercises: Seated   Long Arc Quad  Strengthening;Both;1 set;10 reps      Knee/Hip Exercises: Supine   Quad Sets  Strengthening;Left;1 set;10 reps    Heel Slides  Strengthening;AROM;Left;1 set;10 reps    Bridges  Strengthening;Both;1 set;10 reps    Bridges Limitations  2-3 sec hold    Straight Leg Raises  Strengthening;Left;1 set;10 reps      Manual Therapy   Manual Therapy  Soft tissue mobilization    Manual therapy comments  gluteals, ITB, scar management               PT Short Term Goals - 11/08/17 1539      PT SHORT TERM GOAL #1   Title  Patient will demonstrate understanding and report regular compliance of HEP in order to improve functional mobility.     Time  3    Period  Weeks    Status  New    Target Date  11/29/17      PT SHORT TERM GOAL #2   Title  Patient will demonstrate ability to perform single limb stance for 10 seconds on each lower extremity to improve safety and assist with ambulation and stair navigation.     Time  3    Period  Weeks    Status  New    Target Date  11/29/17      PT SHORT TERM GOAL #3   Title  Patient will demonstrate improvement of 1/2 grade on MMT strength testing in all deficient muscle groups except hip extension in which patient will be able to maintain full resistance in standing, in order to improve patient's ability to ambulate with improved mechanics.     Time  3    Period  Weeks    Status  New    Target Date  11/29/17        PT Long Term Goals - 11/08/17  1542      PT LONG TERM GOAL #1   Title  Patient will demonstrate left hip AROM within  functional limits to assist with ambulation mechanics and safety.     Time  6    Period  Weeks    Status  New    Target Date  12/20/17      PT LONG TERM GOAL #2   Title  Patient will demonstrate improvement of 1 grade on MMT strength testing in all deficient muscle groups except hip extension in which patient will be able to maintain full resistance in standing, in order to improve patient's ability to ambulate with improved mechanics.     Time  6    Period  Weeks    Status  New    Target Date  12/20/17      PT LONG TERM GOAL #3   Title  Patient will demonstrate ability to ambulate at a gait velocity of 0.8 m/s with LRAD indicating improved safety with household ambulation.     Time  6    Period  Weeks    Status  New    Target Date  12/20/17      PT LONG TERM GOAL #4   Title  Patient will demonstrate an improvement of 10% on FOTO indicating an improvement in percieved functional mobility.     Time  6    Period  Weeks    Status  New    Target Date  12/20/17      PT LONG TERM GOAL #5   Title  Patient will demonstrate improvement of 10 seconds on the 5xSTS test indicating improved balance and overall functional mobility.     Time  6    Period  Weeks    Status  New    Target Date  12/20/17            Plan - 11/13/17 1107    Clinical Impression Statement  Patient tolerated treatment well and agreeable to goals reviewed. Patient brought exercise booklet from HHPT. Exercises were reviewed; added standing and seated heel/toe raises, supine bridge, functional squat and superficial scar mobilization to HEP. Added standing hip ext within minimal ROM bilaterally to ther ex in clinic. Completed left hip AROM measurements. Patient would continue to benefit from OPPT with progressive ther ex and HEP to improve strength, balance and function.     History and Personal Factors relevant to plan of care:   CHF, MI, HTN, DM2 vertigo, Left THA 10/17/17, history of knee arthroscopy    Clinical Presentation  Stable    Rehab Potential  Fair    Clinical Impairments Affecting Rehab Potential  Positive: Motivated; Negative: comorbidities    PT Frequency  3x / week    PT Duration  6 weeks    PT Treatment/Interventions  ADLs/Self Care Home Management;Aquatic Therapy;DME Instruction;Gait training;Stair training;Functional mobility training;Therapeutic activities;Therapeutic exercise;Balance training;Neuromuscular re-education;Patient/family education;Orthotic Fit/Training;Manual techniques;Scar mobilization;Passive range of motion;Dry needling;Energy conservation;Taping    PT Next Visit Plan  Review HEP and added ther ex; add tandem balance. Functional lower extremity strengthening exercises and exercises and mobility exercises.     PT Home Exercise Plan  Review exercises patient has been performing at home and upadate as needed at first treatment; 11/13/17 added functional squat, standing hip ext, standing and seated heel/toe raises.     Consulted and Agree with Plan of Care  Patient       Patient will benefit from skilled therapeutic intervention in order to improve the following deficits and impairments:  Abnormal gait, Decreased balance, Decreased mobility, Difficulty walking, Decreased range of motion, Improper body mechanics, Decreased  activity tolerance, Decreased knowledge of use of DME, Decreased strength, Pain  Visit Diagnosis: Muscle weakness (generalized)  Pain in left hip  Other symptoms and signs involving the musculoskeletal system  Other abnormalities of gait and mobility     Problem List Patient Active Problem List   Diagnosis Date Noted  . Primary osteoarthritis of left hip 10/17/2017  . Osteoarthritis of left hip 10/16/2017  . Preop cardiovascular exam 09/10/2017  . Orthopnea 09/10/2017  . Right carotid bruit 10/25/2013  . Diabetes mellitus type 2 in obese ([place])   .  Hyperlipidemia with target LDL less than 70   . Orthostatic hypotension 08/28/2013  . Renal insufficiency 08/19/2013  . Obesity (BMI 30-39.9) 05/05/2013  . CAD S/P percutaneous coronary angioplasty -- LAD/OM1 DES 02/17/13 02/18/2013  . Pulmonary embolism August 2011 (syncope) 02/18/2013  . NSTEMI (non-ST elevated myocardial infarction) ([place]) 02/15/2013  . Essential hypertension 02/15/2013    Floria Raveling. Hartnett-Rands, MS, PT [facility][id number] 11/13/2017, 11:49 AM  [place], [place], [address] Phone: [phone]   Fax:  [phone]  Name: DALAYLA ALDREDGE MRN: [id number] Date of Birth: 03/18/1946

## 2017-11-15 ENCOUNTER — Encounter (HOSPITAL_COMMUNITY): Payer: Self-pay

## 2017-11-15 ENCOUNTER — Ambulatory Visit (HOSPITAL_COMMUNITY): Payer: Medicare Other

## 2017-11-15 DIAGNOSIS — R269 Unspecified abnormalities of gait and mobility: Secondary | ICD-10-CM | POA: Diagnosis not present

## 2017-11-15 DIAGNOSIS — M1612 Unilateral primary osteoarthritis, left hip: Secondary | ICD-10-CM | POA: Diagnosis not present

## 2017-11-15 DIAGNOSIS — M25552 Pain in left hip: Secondary | ICD-10-CM | POA: Diagnosis not present

## 2017-11-15 DIAGNOSIS — R29898 Other symptoms and signs involving the musculoskeletal system: Secondary | ICD-10-CM

## 2017-11-15 DIAGNOSIS — R2689 Other abnormalities of gait and mobility: Secondary | ICD-10-CM

## 2017-11-15 DIAGNOSIS — M6281 Muscle weakness (generalized): Secondary | ICD-10-CM | POA: Diagnosis not present

## 2017-11-15 NOTE — Therapy (Signed)
[place], [place], [address] Phone: [phone]   Fax:  [phone]  Physical Therapy Treatment  Patient Details  Name: Phyllis Conner MRN: [id number] Date of Birth: [birth year] Referring Provider: [name], MD   Encounter Date: 11/15/2017  PT End of Session - 11/15/17 1309    Visit Number  3    Number of Visits  19    Date for PT Re-Evaluation  12/21/17   Minireassess 11/29/17   Authorization Type  Primary: UHC Medicare; secondary: Medicaid    Authorization Time Period  11/08/17 - 12/21/17    Authorization - Visit Number  3    Authorization - Number of Visits  10    PT Start Time  1301    PT Stop Time  1342    PT Time Calculation (min)  41 min    Equipment Utilized During Treatment  Gait belt    Activity Tolerance  Patient tolerated treatment well    Behavior During Therapy  WFL for tasks assessed/performed       Past Medical History:  Diagnosis Date  . Acid reflux   . Anemia    "when I was younger"  . Aortic stenosis    mild AS 09/14/17 echo  . Arthritis   . CAD S/P percutaneous coronary angioplasty November 2014   Mid LAD 2.5 mm x 12 mm Promus DES (2.70 mm); proximal OM1 - Promus DES 2.75 mm x 16 mm (2.85 mm)  . Diabetes mellitus type 2 in obese ([place])   . Essential hypertension   . Headache   . Hyperlipidemia with target LDL less than 70   . Non-Q wave ST elevation myocardial infarction (STEMI) involving left anterior descending (LAD) coronary artery November 2014   PCI to LAD and circumflex  . Obesity (BMI 30-39.9) 05/05/2013    Past Surgical History:  Procedure Laterality Date  . ABDOMINAL HYSTERECTOMY  1990s  . Arthroscopic knee surgery Bilateral 1984, 2002   Arthroscopic  . CARDIAC CATHETERIZATION  November 2014   Mid LAD 95%, proximal OM1 80-90% --> two-vessel PCI  . COLONOSCOPY N/A 12/05/2013   Procedure: COLONOSCOPY;  Surgeon: Beryle Beams, MD;  Location: [facility];  Service:  Endoscopy;  Laterality: N/A;  . COLONOSCOPY WITH PROPOFOL N/A 12/11/2014   Procedure: COLONOSCOPY WITH PROPOFOL;  Surgeon: Carol Ada, MD;  Location: [facility];  Service: Endoscopy;  Laterality: N/A;  . ESOPHAGOGASTRODUODENOSCOPY N/A 12/05/2013   Procedure: ESOPHAGOGASTRODUODENOSCOPY (EGD);  Surgeon: Beryle Beams, MD;  Location: [facility];  Service: Endoscopy;  Laterality: N/A;  . LEFT HEART CATHETERIZATION WITH CORONARY ANGIOGRAM N/A 02/17/2013   Procedure: LEFT HEART CATHETERIZATION WITH CORONARY ANGIOGRAM;  Surgeon: Sanda Klein, MD;  Location: [facility];  Service: Cardiovascular;  Laterality: N/A;  . PERCUTANEOUS CORONARY STENT INTERVENTION (PCI-S)  November 2014   Mid LAD 2.5 mm x 12 mm (2.75 mm) Promus DES; OM1 2.75 mm x 60 mm (2.85 mm) Promus DES  . TOTAL HIP ARTHROPLASTY Left 10/17/2017  . TOTAL HIP ARTHROPLASTY Left 10/17/2017   Procedure: TOTAL HIP ARTHROPLASTY ANTERIOR APPROACH;  Surgeon: [name], MD;  Location: [facility];  Service: Orthopedics;  Laterality: Left;  . TRANSTHORACIC ECHOCARDIOGRAM  02/16/2013   EF 40-50%. Moderate concentric LVH. Moderate H. Shea of mid-distal septal and apical region. Grade 1 diastolic dysfunction. Moderate aortic sclerosis without stenosis.    There were no vitals filed for this visit.  Subjective Assessment - 11/15/17 1307    Subjective  Pt stated she is feeling good today, no reports of pain just stiffness and weakness.  Most difficulty currently with putting on socks and shoes.  Pt stated she is not supposed to bring let out to side, will ask doctor when she had begin that at next apt on 11/27/17.    Pertinent History  History of arthroscopic knee surgery; Left total hip replacement 10/17/17    Patient Stated Goals  To walk normal     Currently in Pain?  No/denies         [facility] PT Assessment - 11/15/17 0001      Assessment   Medical Diagnosis  S/P Left Total hip Arthroplasty    Referring Provider  [name], MD    Onset  Date/Surgical Date  10/17/17    Hand Dominance  Left    Next MD Visit  11/27/17    Prior Therapy  Yes for hip before surgery      Precautions   Precautions  Anterior Hip      Restrictions   Weight Bearing Restrictions  Yes    LLE Weight Bearing  Weight bearing as tolerated                   [facility] Adult PT Treatment/Exercise - 11/15/17 0001      Exercises   Exercises  Knee/Hip      Knee/Hip Exercises: Standing   Heel Raises  1 set;10 reps    Hip Flexion  Stengthening;Both;1 set;10 reps    Hip Extension  Stengthening;1 set;10 reps;Both    Functional Squat  1 set;10 reps;Limitations    Functional Squat Limitations  minisquat with BUE A    Other Standing Knee Exercises  tandem stance 2x 30"      Knee/Hip Exercises: Seated   Sit to Sand  10 reps;without UE support   eccentric control     Knee/Hip Exercises: Supine   Heel Slides  Strengthening;AROM;Left;1 set;10 reps    Bridges  Both;15 reps    Bridges Limitations  2-3 sec hold    Straight Leg Raises  Strengthening;Left;1 set;10 reps               PT Short Term Goals - 11/08/17 1539      PT SHORT TERM GOAL #1   Title  Patient will demonstrate understanding and report regular compliance of HEP in order to improve functional mobility.     Time  3    Period  Weeks    Status  New    Target Date  11/29/17      PT SHORT TERM GOAL #2   Title  Patient will demonstrate ability to perform single limb stance for 10 seconds on each lower extremity to improve safety and assist with ambulation and stair navigation.     Time  3    Period  Weeks    Status  New    Target Date  11/29/17      PT SHORT TERM GOAL #3   Title  Patient will demonstrate improvement of 1/2 grade on MMT strength testing in all deficient muscle groups except hip extension in which patient will be able to maintain full resistance in standing, in order to improve patient's ability to ambulate with improved mechanics.     Time  3    Period   Weeks    Status  New    Target Date  11/29/17        PT Long Term Goals - 11/08/17  [place] #1   Title  Patient will demonstrate left hip AROM within functional limits to assist with ambulation mechanics and safety.     Time  6    Period  Weeks    Status  New    Target Date  12/20/17      PT LONG TERM GOAL #2   Title  Patient will demonstrate improvement of 1 grade on MMT strength testing in all deficient muscle groups except hip extension in which patient will be able to maintain full resistance in standing, in order to improve patient's ability to ambulate with improved mechanics.     Time  6    Period  Weeks    Status  New    Target Date  12/20/17      PT LONG TERM GOAL #3   Title  Patient will demonstrate ability to ambulate at a gait velocity of 0.8 m/s with LRAD indicating improved safety with household ambulation.     Time  6    Period  Weeks    Status  New    Target Date  12/20/17      PT LONG TERM GOAL #4   Title  Patient will demonstrate an improvement of 10% on FOTO indicating an improvement in percieved functional mobility.     Time  6    Period  Weeks    Status  New    Target Date  12/20/17      PT LONG TERM GOAL #5   Title  Patient will demonstrate improvement of 10 seconds on the 5xSTS test indicating improved balance and overall functional mobility.     Time  6    Period  Weeks    Status  New    Target Date  12/20/17            Plan - 11/15/17 1326    Clinical Impression Statement  Began session reviewing compliance with HEP, pt required cueing for task though able to recall and demonstrate appropriate form/mechanics except for squat that did require some education on proper mechanics.  Progressed hip strengthening and added balance activities to improve hip stability.  No reports of pain through session, was limited by fatigue.  Pt reports precaution at hospital to not complete any side movements with hip, held abduction  exercises this session and messaged MD for current precautions and assurance to begin glut med strengthening exercises.      Rehab Potential  Fair    Clinical Impairments Affecting Rehab Potential  Positive: Motivated; Negative: comorbidities    PT Frequency  3x / week    PT Duration  6 weeks    PT Treatment/Interventions  ADLs/Self Care Home Management;Aquatic Therapy;DME Instruction;Gait training;Stair training;Functional mobility training;Therapeutic activities;Therapeutic exercise;Balance training;Neuromuscular re-education;Patient/family education;Orthotic Fit/Training;Manual techniques;Scar mobilization;Passive range of motion;Dry needling;Energy conservation;Taping    PT Next Visit Plan  F/U with abd precautions per MD Myriam Jacobson or [place] included in message), begin sidelying abd as well as standing when cleared.  Next session begin tandem stance on foam.  Functional lower extremity strengthening exercises and exercises and mobility exercises.     PT Home Exercise Plan  Review exercises patient has been performing at home and upadate as needed at first treatment; 11/13/17 added functional squat, standing hip ext, standing and seated heel/toe raises.        Patient will benefit from skilled therapeutic intervention in order to improve the following deficits and impairments:  Abnormal  gait, Decreased balance, Decreased mobility, Difficulty walking, Decreased range of motion, Improper body mechanics, Decreased activity tolerance, Decreased knowledge of use of DME, Decreased strength, Pain  Visit Diagnosis: Muscle weakness (generalized)  Pain in left hip  Other symptoms and signs involving the musculoskeletal system  Other abnormalities of gait and mobility     Problem List Patient Active Problem List   Diagnosis Date Noted  . Primary osteoarthritis of left hip 10/17/2017  . Osteoarthritis of left hip 10/16/2017  . Preop cardiovascular exam 09/10/2017  . Orthopnea 09/10/2017  . Right  carotid bruit 10/25/2013  . Diabetes mellitus type 2 in obese ([place])   . Hyperlipidemia with target LDL less than 70   . Orthostatic hypotension 08/28/2013  . Renal insufficiency 08/19/2013  . Obesity (BMI 30-39.9) 05/05/2013  . CAD S/P percutaneous coronary angioplasty -- LAD/OM1 DES 02/17/13 02/18/2013  . Pulmonary embolism August 2011 (syncope) 02/18/2013  . NSTEMI (non-ST elevated myocardial infarction) ([place]) 02/15/2013  . Essential hypertension 02/15/2013   Ihor Austin, [place]; [place]  Aldona Lento 11/15/2017, 2:55 PM  [place] [address] [place], [place], [address] Phone: [phone]   Fax:  [phone]  Name: Phyllis Conner MRN: [id number] Date of Birth: Mar 05, 1946

## 2017-11-16 ENCOUNTER — Ambulatory Visit (HOSPITAL_COMMUNITY): Payer: Medicare Other

## 2017-11-16 ENCOUNTER — Telehealth (HOSPITAL_COMMUNITY): Payer: Self-pay

## 2017-11-16 ENCOUNTER — Encounter (HOSPITAL_COMMUNITY): Payer: Self-pay

## 2017-11-16 VITALS — BP 97/63

## 2017-11-16 DIAGNOSIS — R29898 Other symptoms and signs involving the musculoskeletal system: Secondary | ICD-10-CM

## 2017-11-16 DIAGNOSIS — R2689 Other abnormalities of gait and mobility: Secondary | ICD-10-CM

## 2017-11-16 DIAGNOSIS — M6281 Muscle weakness (generalized): Secondary | ICD-10-CM | POA: Diagnosis not present

## 2017-11-16 DIAGNOSIS — M1612 Unilateral primary osteoarthritis, left hip: Secondary | ICD-10-CM | POA: Diagnosis not present

## 2017-11-16 DIAGNOSIS — M25552 Pain in left hip: Secondary | ICD-10-CM | POA: Diagnosis not present

## 2017-11-16 DIAGNOSIS — R269 Unspecified abnormalities of gait and mobility: Secondary | ICD-10-CM | POA: Diagnosis not present

## 2017-11-16 NOTE — Patient Instructions (Signed)
Abduction    Lift leg up toward ceiling. Return.  Repeat 10 times each leg. Do 1-2 sessions per day.  http://gt2.exer.us/386   Abduction: Clam (Eccentric) - Side-Lying    Lie on side with knees bent. Lift top knee, keeping feet together. Keep trunk steady. Slowly lower for 3-5 seconds. 10-15 reps per set, 1-2 sets per day, 4 days per week.  http://ecce.exer.us/65   Copyright  VHI. All rights reserved.

## 2017-11-16 NOTE — Telephone Encounter (Signed)
Called MD Rowan's office pertaining towards precautions with anterior hip as well as permission to begin glut med/abduction strengthening following reports from Pt to not complete any side movements.  Spoke to supervisor Wells Guiles who plans to ask physician and will call back.   [address], [place]; [facility] [phone]

## 2017-11-16 NOTE — Telephone Encounter (Signed)
MD Rowan's office supervisor called back and stated we are allowed to begin hip abduction.  Continue wiht anterior hip precautions (no hyperextension)  Ihor Austin, [place]; [facility] [phone]

## 2017-11-16 NOTE — Therapy (Signed)
[place], [place], [address] Phone: [phone]   Fax:  [phone]  Physical Therapy Treatment  Patient Details  Name: AFTEN LIPSEY MRN: [id number] Date of Birth: Jun 01, 1945 Referring Provider: [name], MD   Encounter Date: 11/16/2017  PT End of Session - 11/16/17 1403    Visit Number  4    Number of Visits  19    Date for PT Re-Evaluation  12/21/17   Minireassess 11/29/17   Authorization Type  Primary: UHC Medicare; secondary: Medicaid    Authorization Time Period  11/08/17 - 12/21/17    Authorization - Visit Number  4    Authorization - Number of Visits  10    PT Start Time  4174   pt late for apt   PT Stop Time  1430    PT Time Calculation (min)  34 min    Equipment Utilized During Treatment  Gait belt    Activity Tolerance  Patient tolerated treatment well;Patient limited by fatigue    Behavior During Therapy  WFL for tasks assessed/performed       Past Medical History:  Diagnosis Date  . Acid reflux   . Anemia    "when I was younger"  . Aortic stenosis    mild AS 09/14/17 echo  . Arthritis   . CAD S/P percutaneous coronary angioplasty November 2014   Mid LAD 2.5 mm x 12 mm Promus DES (2.70 mm); proximal OM1 - Promus DES 2.75 mm x 16 mm (2.85 mm)  . Diabetes mellitus type 2 in obese ([place])   . Essential hypertension   . Headache   . Hyperlipidemia with target LDL less than 70   . Non-Q wave ST elevation myocardial infarction (STEMI) involving left anterior descending (LAD) coronary artery November 2014   PCI to LAD and circumflex  . Obesity (BMI 30-39.9) 05/05/2013    Past Surgical History:  Procedure Laterality Date  . ABDOMINAL HYSTERECTOMY  1990s  . Arthroscopic knee surgery Bilateral 1984, 2002   Arthroscopic  . CARDIAC CATHETERIZATION  November 2014   Mid LAD 95%, proximal OM1 80-90% --> two-vessel PCI  . COLONOSCOPY N/A 12/05/2013   Procedure: COLONOSCOPY;  Surgeon: Beryle Beams,  MD;  Location: [facility];  Service: Endoscopy;  Laterality: N/A;  . COLONOSCOPY WITH PROPOFOL N/A 12/11/2014   Procedure: COLONOSCOPY WITH PROPOFOL;  Surgeon: Carol Ada, MD;  Location: [facility];  Service: Endoscopy;  Laterality: N/A;  . ESOPHAGOGASTRODUODENOSCOPY N/A 12/05/2013   Procedure: ESOPHAGOGASTRODUODENOSCOPY (EGD);  Surgeon: Beryle Beams, MD;  Location: [facility];  Service: Endoscopy;  Laterality: N/A;  . LEFT HEART CATHETERIZATION WITH CORONARY ANGIOGRAM N/A 02/17/2013   Procedure: LEFT HEART CATHETERIZATION WITH CORONARY ANGIOGRAM;  Surgeon: Sanda Klein, MD;  Location: [facility];  Service: Cardiovascular;  Laterality: N/A;  . PERCUTANEOUS CORONARY STENT INTERVENTION (PCI-S)  November 2014   Mid LAD 2.5 mm x 12 mm (2.75 mm) Promus DES; OM1 2.75 mm x 60 mm (2.85 mm) Promus DES  . TOTAL HIP ARTHROPLASTY Left 10/17/2017  . TOTAL HIP ARTHROPLASTY Left 10/17/2017   Procedure: TOTAL HIP ARTHROPLASTY ANTERIOR APPROACH;  Surgeon: [name], MD;  Location: [facility];  Service: Orthopedics;  Laterality: Left;  . TRANSTHORACIC ECHOCARDIOGRAM  02/16/2013   EF 40-50%. Moderate concentric LVH. Moderate H. Shea of mid-distal septal and apical region. Grade 1 diastolic dysfunction. Moderate aortic sclerosis without stenosis.    Vitals:   11/16/17 1406  BP: 97/63  Subjective Assessment - 11/16/17 1401    Subjective  Pt late for apt.  No reports of current pain, was a little sore following last session.  Reports ability to sleep on Lt side this week.  Reports she had an episode of vertigo earlier today.      Patient Stated Goals  To walk normal     Currently in Pain?  No/denies                       [facility] Adult PT Treatment/Exercise - 11/16/17 0001      Exercises   Exercises  Knee/Hip      Knee/Hip Exercises: Standing   Heel Raises  20 reps    Heel Raises Limitations  toe raises on incline    Hip Flexion  Stengthening;Both;1 set;10 reps    Functional Squat   1 set;10 reps;Limitations    Functional Squat Limitations  minisquat with BUE A    Other Standing Knee Exercises  tandem stance on foam 2x 30"      Knee/Hip Exercises: Supine   Bridges  Both;20 reps    Bridges Limitations  2-3 sec hold    Straight Leg Raises  Strengthening;Left;1 set;10 reps      Knee/Hip Exercises: Sidelying   Hip ABduction  Both;10 reps    Clams  BLE 10x 5" holds               PT Short Term Goals - 11/08/17 1539      PT SHORT TERM GOAL #1   Title  Patient will demonstrate understanding and report regular compliance of HEP in order to improve functional mobility.     Time  3    Period  Weeks    Status  New    Target Date  11/29/17      PT SHORT TERM GOAL #2   Title  Patient will demonstrate ability to perform single limb stance for 10 seconds on each lower extremity to improve safety and assist with ambulation and stair navigation.     Time  3    Period  Weeks    Status  New    Target Date  11/29/17      PT SHORT TERM GOAL #3   Title  Patient will demonstrate improvement of 1/2 grade on MMT strength testing in all deficient muscle groups except hip extension in which patient will be able to maintain full resistance in standing, in order to improve patient's ability to ambulate with improved mechanics.     Time  3    Period  Weeks    Status  New    Target Date  11/29/17        PT Long Term Goals - 11/08/17 1542      PT LONG TERM GOAL #1   Title  Patient will demonstrate left hip AROM within functional limits to assist with ambulation mechanics and safety.     Time  6    Period  Weeks    Status  New    Target Date  12/20/17      PT LONG TERM GOAL #2   Title  Patient will demonstrate improvement of 1 grade on MMT strength testing in all deficient muscle groups except hip extension in which patient will be able to maintain full resistance in standing, in order to improve patient's ability to ambulate with improved mechanics.     Time  6     Period  Weeks  Status  New    Target Date  12/20/17      PT LONG TERM GOAL #3   Title  Patient will demonstrate ability to ambulate at a gait velocity of 0.8 m/s with LRAD indicating improved safety with household ambulation.     Time  6    Period  Weeks    Status  New    Target Date  12/20/17      PT LONG TERM GOAL #4   Title  Patient will demonstrate an improvement of 10% on FOTO indicating an improvement in percieved functional mobility.     Time  6    Period  Weeks    Status  New    Target Date  12/20/17      PT LONG TERM GOAL #5   Title  Patient will demonstrate improvement of 10 seconds on the 5xSTS test indicating improved balance and overall functional mobility.     Time  6    Period  Weeks    Status  New    Target Date  12/20/17            Plan - 11/16/17 1410    Clinical Impression Statement  Called MD office prior session asking about specific protocols and when to begin glut med strengthening, their supervisor called me back and stated to follow general anterior hip precautions and okay to begin glut med strengthening.  Session focus on hip strengthening with additional glut med strengthening exercises.  Pt given clam and sidelying exercises to add to HEP.  No reports of pain through session, was limited by fatigue.     Rehab Potential  Fair    Clinical Impairments Affecting Rehab Potential  Positive: Motivated; Negative: comorbidities    PT Frequency  3x / week    PT Duration  6 weeks    PT Treatment/Interventions  ADLs/Self Care Home Management;Aquatic Therapy;DME Instruction;Gait training;Stair training;Functional mobility training;Therapeutic activities;Therapeutic exercise;Balance training;Neuromuscular re-education;Patient/family education;Orthotic Fit/Training;Manual techniques;Scar mobilization;Passive range of motion;Dry needling;Energy conservation;Taping    PT Next Visit Plan  Add sidestepping next session.   Functional lower extremity strengthening  exercises and exercises and mobility exercises.     PT Home Exercise Plan  Review exercises patient has been performing at home and upadate as needed at first treatment; 11/13/17 added functional squat, standing hip ext, standing and seated heel/toe raises.        Patient will benefit from skilled therapeutic intervention in order to improve the following deficits and impairments:  Abnormal gait, Decreased balance, Decreased mobility, Difficulty walking, Decreased range of motion, Improper body mechanics, Decreased activity tolerance, Decreased knowledge of use of DME, Decreased strength, Pain  Visit Diagnosis: Muscle weakness (generalized)  Pain in left hip  Other symptoms and signs involving the musculoskeletal system  Other abnormalities of gait and mobility     Problem List Patient Active Problem List   Diagnosis Date Noted  . Primary osteoarthritis of left hip 10/17/2017  . Osteoarthritis of left hip 10/16/2017  . Preop cardiovascular exam 09/10/2017  . Orthopnea 09/10/2017  . Right carotid bruit 10/25/2013  . Diabetes mellitus type 2 in obese ([place])   . Hyperlipidemia with target LDL less than 70   . Orthostatic hypotension 08/28/2013  . Renal insufficiency 08/19/2013  . Obesity (BMI 30-39.9) 05/05/2013  . CAD S/P percutaneous coronary angioplasty -- LAD/OM1 DES 02/17/13 02/18/2013  . Pulmonary embolism August 2011 (syncope) 02/18/2013  . NSTEMI (non-ST elevated myocardial infarction) ([place]) 02/15/2013  . Essential hypertension 02/15/2013  [address], LPTA; [place]  Aldona Lento 11/16/2017, 6:00 PM  [place], [place], [address] Phone: [phone]   Fax:  [phone]  Name: VANDORA JASKULSKI MRN: [id number] Date of Birth: 1946-02-18

## 2017-11-20 ENCOUNTER — Ambulatory Visit (HOSPITAL_COMMUNITY): Payer: Medicare Other | Admitting: Physical Therapy

## 2017-11-20 ENCOUNTER — Encounter (HOSPITAL_COMMUNITY): Payer: Self-pay | Admitting: Physical Therapy

## 2017-11-20 DIAGNOSIS — M25552 Pain in left hip: Secondary | ICD-10-CM

## 2017-11-20 DIAGNOSIS — R29898 Other symptoms and signs involving the musculoskeletal system: Secondary | ICD-10-CM

## 2017-11-20 DIAGNOSIS — M6281 Muscle weakness (generalized): Secondary | ICD-10-CM | POA: Diagnosis not present

## 2017-11-20 DIAGNOSIS — R2689 Other abnormalities of gait and mobility: Secondary | ICD-10-CM | POA: Diagnosis not present

## 2017-11-20 DIAGNOSIS — R269 Unspecified abnormalities of gait and mobility: Secondary | ICD-10-CM | POA: Diagnosis not present

## 2017-11-20 DIAGNOSIS — M1612 Unilateral primary osteoarthritis, left hip: Secondary | ICD-10-CM | POA: Diagnosis not present

## 2017-11-20 NOTE — Therapy (Signed)
[place], [place], [address] Phone: [phone]   Fax:  [phone]  Physical Therapy Treatment  Patient Details  Name: Phyllis Conner MRN: [id number] Date of Birth: 08-May-1945 Referring Provider: [name], MD   Encounter Date: 11/20/2017  PT End of Session - 11/20/17 1448    Visit Number  5    Number of Visits  19    Date for PT Re-Evaluation  12/21/17   Minireassess 11/29/17   Authorization Type  Primary: UHC Medicare; secondary: Medicaid    Authorization Time Period  11/08/17 - 12/21/17    Authorization - Visit Number  5    Authorization - Number of Visits  10    PT Start Time  3299   Patient arrived late   PT Stop Time  1516    PT Time Calculation (min)  30 min    Equipment Utilized During Treatment  --    Activity Tolerance  Patient tolerated treatment well;Patient limited by fatigue    Behavior During Therapy  [facility] for tasks assessed/performed       Past Medical History:  Diagnosis Date  . Acid reflux   . Anemia    "when I was younger"  . Aortic stenosis    mild AS 09/14/17 echo  . Arthritis   . CAD S/P percutaneous coronary angioplasty November 2014   Mid LAD 2.5 mm x 12 mm Promus DES (2.70 mm); proximal OM1 - Promus DES 2.75 mm x 16 mm (2.85 mm)  . Diabetes mellitus type 2 in obese ([place])   . Essential hypertension   . Headache   . Hyperlipidemia with target LDL less than 70   . Non-Q wave ST elevation myocardial infarction (STEMI) involving left anterior descending (LAD) coronary artery November 2014   PCI to LAD and circumflex  . Obesity (BMI 30-39.9) 05/05/2013    Past Surgical History:  Procedure Laterality Date  . ABDOMINAL HYSTERECTOMY  1990s  . Arthroscopic knee surgery Bilateral 1984, 2002   Arthroscopic  . CARDIAC CATHETERIZATION  November 2014   Mid LAD 95%, proximal OM1 80-90% --> two-vessel PCI  . COLONOSCOPY N/A 12/05/2013   Procedure: COLONOSCOPY;  Surgeon: Beryle Beams, MD;   Location: [facility];  Service: Endoscopy;  Laterality: N/A;  . COLONOSCOPY WITH PROPOFOL N/A 12/11/2014   Procedure: COLONOSCOPY WITH PROPOFOL;  Surgeon: Carol Ada, MD;  Location: [facility];  Service: Endoscopy;  Laterality: N/A;  . ESOPHAGOGASTRODUODENOSCOPY N/A 12/05/2013   Procedure: ESOPHAGOGASTRODUODENOSCOPY (EGD);  Surgeon: Beryle Beams, MD;  Location: [facility];  Service: Endoscopy;  Laterality: N/A;  . LEFT HEART CATHETERIZATION WITH CORONARY ANGIOGRAM N/A 02/17/2013   Procedure: LEFT HEART CATHETERIZATION WITH CORONARY ANGIOGRAM;  Surgeon: Sanda Klein, MD;  Location: [facility] CATH LAB;  Service: Cardiovascular;  Laterality: N/A;  . PERCUTANEOUS CORONARY STENT INTERVENTION (PCI-S)  November 2014   Mid LAD 2.5 mm x 12 mm (2.75 mm) Promus DES; OM1 2.75 mm x 60 mm (2.85 mm) Promus DES  . TOTAL HIP ARTHROPLASTY Left 10/17/2017  . TOTAL HIP ARTHROPLASTY Left 10/17/2017   Procedure: TOTAL HIP ARTHROPLASTY ANTERIOR APPROACH;  Surgeon: [name], MD;  Location: [facility];  Service: Orthopedics;  Laterality: Left;  . TRANSTHORACIC ECHOCARDIOGRAM  02/16/2013   EF 40-50%. Moderate concentric LVH. Moderate H. Shea of mid-distal septal and apical region. Grade 1 diastolic dysfunction. Moderate aortic sclerosis without stenosis.    There were no vitals filed for this visit.  Subjective Assessment - 11/20/17  1446    Subjective  Patient arrived late for her appointment. Patient denied any pain currently. She stated she does have some stiffness. Patient reported she has only been doing some of her exercises at home.     Patient Stated Goals  To walk normal     Currently in Pain?  No/denies                       [facility] Adult PT Treatment/Exercise - 11/20/17 0001      Knee/Hip Exercises: Standing   Heel Raises  20 reps    Heel Raises Limitations  toe raises on incline    Hip Flexion  Stengthening;Both;1 set;20 reps;Knee bent   Alternating marching 3'' holds   Functional  Squat  1 set;10 reps;Limitations    Functional Squat Limitations  minisquat with BUE A    Other Standing Knee Exercises  tandem stance on foam 2x 30"      Knee/Hip Exercises: Seated   Sit to Sand  10 reps;without UE support   From 22-inch mat table for improved mechanics     Knee/Hip Exercises: Supine   Bridges  Both;20 reps    Bridges Limitations  2-3 sec hold    Straight Leg Raises  Strengthening;Left;1 set;10 reps      Knee/Hip Exercises: Sidelying   Hip ABduction  Both;10 reps    Clams  BLE 10x 5" holds             PT Education - 11/20/17 1447    Education Details  Educated patient on purpose and technique of exercises throughout session.     Person(s) Educated  Patient    Methods  Explanation    Comprehension  Verbalized understanding       PT Short Term Goals - 11/20/17 1531      PT SHORT TERM GOAL #1   Title  Patient will demonstrate understanding and report regular compliance of HEP in order to improve functional mobility.     Time  3    Period  Weeks    Status  On-going      PT SHORT TERM GOAL #2   Title  Patient will demonstrate ability to perform single limb stance for 10 seconds on each lower extremity to improve safety and assist with ambulation and stair navigation.     Time  3    Period  Weeks    Status  On-going      PT SHORT TERM GOAL #3   Title  Patient will demonstrate improvement of 1/2 grade on MMT strength testing in all deficient muscle groups except hip extension in which patient will be able to maintain full resistance in standing, in order to improve patient's ability to ambulate with improved mechanics.     Time  3    Period  Weeks    Status  On-going        PT Long Term Goals - 11/20/17 1532      PT LONG TERM GOAL #1   Title  Patient will demonstrate left hip AROM within functional limits to assist with ambulation mechanics and safety.     Time  6    Period  Weeks    Status  On-going      PT LONG TERM GOAL #2   Title   Patient will demonstrate improvement of 1 grade on MMT strength testing in all deficient muscle groups except hip extension in which patient will be able to maintain  full resistance in standing, in order to improve patient's ability to ambulate with improved mechanics.     Time  6    Period  Weeks    Status  On-going      PT LONG TERM GOAL #3   Title  Patient will demonstrate ability to ambulate at a gait velocity of 0.8 m/s with LRAD indicating improved safety with household ambulation.     Time  6    Period  Weeks    Status  On-going      PT LONG TERM GOAL #4   Title  Patient will demonstrate an improvement of 10% on FOTO indicating an improvement in percieved functional mobility.     Time  6    Period  Weeks    Status  On-going      PT LONG TERM GOAL #5   Title  Patient will demonstrate improvement of 10 seconds on the 5xSTS test indicating improved balance and overall functional mobility.     Time  6    Period  Weeks    Status  On-going            Plan - 11/20/17 1531    Clinical Impression Statement  Shortened session this session as patient arrived late to therapy. This session continued with exercises to improve hip strength and mobility. This session had patient perform marching for 20 repetitions with 3 second holds. Also had patient perform sit to stands from 22 inch mat for improved mechanics. Patient would benefit from continued skilled physical therapy in order to continue addressing deficits in strength, balance, mobility, and overall functional mobility.     Rehab Potential  Fair    Clinical Impairments Affecting Rehab Potential  Positive: Motivated; Negative: comorbidities    PT Frequency  3x / week    PT Duration  6 weeks    PT Treatment/Interventions  ADLs/Self Care Home Management;Aquatic Therapy;DME Instruction;Gait training;Stair training;Functional mobility training;Therapeutic activities;Therapeutic exercise;Balance training;Neuromuscular  re-education;Patient/family education;Orthotic Fit/Training;Manual techniques;Scar mobilization;Passive range of motion;Dry needling;Energy conservation;Taping    PT Next Visit Plan  Continue with sit to stands from elevated mat for improved mechanics and lower mat to progress. Add sidestepping next session.   Functional lower extremity strengthening exercises and exercises and mobility exercises.     PT Home Exercise Plan  Review exercises patient has been performing at home and upadate as needed at first treatment; 11/13/17 added functional squat, standing hip ext, standing and seated heel/toe raises.        Patient will benefit from skilled therapeutic intervention in order to improve the following deficits and impairments:  Abnormal gait, Decreased balance, Decreased mobility, Difficulty walking, Decreased range of motion, Improper body mechanics, Decreased activity tolerance, Decreased knowledge of use of DME, Decreased strength, Pain  Visit Diagnosis: Muscle weakness (generalized)  Pain in left hip  Other symptoms and signs involving the musculoskeletal system  Other abnormalities of gait and mobility     Problem List Patient Active Problem List   Diagnosis Date Noted  . Primary osteoarthritis of left hip 10/17/2017  . Osteoarthritis of left hip 10/16/2017  . Preop cardiovascular exam 09/10/2017  . Orthopnea 09/10/2017  . Right carotid bruit 10/25/2013  . Diabetes mellitus type 2 in obese ([place])   . Hyperlipidemia with target LDL less than 70   . Orthostatic hypotension 08/28/2013  . Renal insufficiency 08/19/2013  . Obesity (BMI 30-39.9) 05/05/2013  . CAD S/P percutaneous coronary angioplasty -- LAD/OM1 DES 02/17/13 02/18/2013  . Pulmonary embolism  August 2011 (syncope) 02/18/2013  . NSTEMI (non-ST elevated myocardial infarction) ([place]) 02/15/2013  . Essential hypertension 02/15/2013    Clarene Critchley PT, DPT 3:34 PM, 11/20/17 [place], [place], [address] Phone: [phone]   Fax:  [phone]  Name: Phyllis Conner MRN: [id number] Date of Birth: Aug 30, 1945

## 2017-11-21 DIAGNOSIS — E1169 Type 2 diabetes mellitus with other specified complication: Secondary | ICD-10-CM | POA: Diagnosis not present

## 2017-11-21 DIAGNOSIS — I10 Essential (primary) hypertension: Secondary | ICD-10-CM | POA: Diagnosis not present

## 2017-11-21 DIAGNOSIS — R898 Other abnormal findings in specimens from other organs, systems and tissues: Secondary | ICD-10-CM | POA: Diagnosis not present

## 2017-11-21 DIAGNOSIS — D649 Anemia, unspecified: Secondary | ICD-10-CM | POA: Diagnosis not present

## 2017-11-21 DIAGNOSIS — R799 Abnormal finding of blood chemistry, unspecified: Secondary | ICD-10-CM | POA: Diagnosis not present

## 2017-11-22 ENCOUNTER — Telehealth (HOSPITAL_COMMUNITY): Payer: Self-pay | Admitting: Physical Therapy

## 2017-11-22 ENCOUNTER — Ambulatory Visit (HOSPITAL_COMMUNITY): Payer: Medicare Other | Admitting: Physical Therapy

## 2017-11-22 NOTE — Telephone Encounter (Signed)
She has a headache and will not be here today

## 2017-11-23 ENCOUNTER — Encounter (HOSPITAL_COMMUNITY): Payer: Self-pay | Admitting: Physical Therapy

## 2017-11-23 ENCOUNTER — Ambulatory Visit (HOSPITAL_COMMUNITY): Payer: Medicare Other | Admitting: Physical Therapy

## 2017-11-23 DIAGNOSIS — R2689 Other abnormalities of gait and mobility: Secondary | ICD-10-CM

## 2017-11-23 DIAGNOSIS — R29898 Other symptoms and signs involving the musculoskeletal system: Secondary | ICD-10-CM | POA: Diagnosis not present

## 2017-11-23 DIAGNOSIS — M6281 Muscle weakness (generalized): Secondary | ICD-10-CM

## 2017-11-23 DIAGNOSIS — M25552 Pain in left hip: Secondary | ICD-10-CM

## 2017-11-23 DIAGNOSIS — R269 Unspecified abnormalities of gait and mobility: Secondary | ICD-10-CM | POA: Diagnosis not present

## 2017-11-23 DIAGNOSIS — M1612 Unilateral primary osteoarthritis, left hip: Secondary | ICD-10-CM | POA: Diagnosis not present

## 2017-11-23 NOTE — Therapy (Addendum)
[place] [address] [place], [place], [address] Phone: [phone]   Fax:  [phone]  Physical Therapy Treatment / Re-assessment / Progress note  Patient Details  Name: KEVYN WENGERT MRN: [id number] Date of Birth: [birth year] Referring Provider: [name], MD   Encounter Date: 11/23/2017  Progress Note Reporting Period 11/08/17 to 11/23/17  See note below for Objective Data and Assessment of Progress/Goals.       PT End of Session - 11/23/17 1658    Visit Number  6    Number of Visits  19    Date for PT Re-Evaluation  12/21/17   Minireassess 11/29/17   Authorization Type  Primary: UHC Medicare; secondary: Medicaid    Authorization Time Period  11/08/17 - 12/21/17    Authorization - Visit Number  6    Authorization - Number of Visits  10    PT Start Time  1610   Patient arrived late   PT Stop Time  1650    PT Time Calculation (min)  40 min    Activity Tolerance  Patient tolerated treatment well;Patient limited by fatigue    Behavior During Therapy  [facility] for tasks assessed/performed       Past Medical History:  Diagnosis Date  . Acid reflux   . Anemia    "when I was younger"  . Aortic stenosis    mild AS 09/14/17 echo  . Arthritis   . CAD S/P percutaneous coronary angioplasty November 2014   Mid LAD 2.5 mm x 12 mm Promus DES (2.70 mm); proximal OM1 - Promus DES 2.75 mm x 16 mm (2.85 mm)  . Diabetes mellitus type 2 in obese ([place])   . Essential hypertension   . Headache   . Hyperlipidemia with target LDL less than 70   . Non-Q wave ST elevation myocardial infarction (STEMI) involving left anterior descending (LAD) coronary artery November 2014   PCI to LAD and circumflex  . Obesity (BMI 30-39.9) 05/05/2013    Past Surgical History:  Procedure Laterality Date  . ABDOMINAL HYSTERECTOMY  1990s  . Arthroscopic knee surgery Bilateral 1984, 2002   Arthroscopic  . CARDIAC CATHETERIZATION  November 2014   Mid LAD 95%,  proximal OM1 80-90% --> two-vessel PCI  . COLONOSCOPY N/A 12/05/2013   Procedure: COLONOSCOPY;  Surgeon: Beryle Beams, MD;  [facility];  Service: Endoscopy;  Laterality: N/A;  . COLONOSCOPY WITH PROPOFOL N/A 12/11/2014   Procedure: COLONOSCOPY WITH PROPOFOL;  Surgeon: Carol Ada, MD;  Location: [facility];  Service: Endoscopy;  Laterality: N/A;  . ESOPHAGOGASTRODUODENOSCOPY N/A 12/05/2013   Procedure: ESOPHAGOGASTRODUODENOSCOPY (EGD);  Surgeon: Beryle Beams, MD;  Location: [facility];  Service: Endoscopy;  Laterality: N/A;  . LEFT HEART CATHETERIZATION WITH CORONARY ANGIOGRAM N/A 02/17/2013   Procedure: LEFT HEART CATHETERIZATION WITH CORONARY ANGIOGRAM;  Surgeon: Sanda Klein, MD;  Location: [facility] CATH LAB;  Service: Cardiovascular;  Laterality: N/A;  . PERCUTANEOUS CORONARY STENT INTERVENTION (PCI-S)  November 2014   Mid LAD 2.5 mm x 12 mm (2.75 mm) Promus DES; OM1 2.75 mm x 60 mm (2.85 mm) Promus DES  . TOTAL HIP ARTHROPLASTY Left 10/17/2017  . TOTAL HIP ARTHROPLASTY Left 10/17/2017   Procedure: TOTAL HIP ARTHROPLASTY ANTERIOR APPROACH;  Surgeon: [name], MD;  Location: [facility];  Service: Orthopedics;  Laterality: Left;  . TRANSTHORACIC ECHOCARDIOGRAM  02/16/2013   EF 40-50%. Moderate concentric LVH. Moderate H. Shea of mid-distal septal and apical region. Grade 1 diastolic dysfunction. Moderate  aortic sclerosis without stenosis.    There were no vitals filed for this visit.  Subjective Assessment - 11/23/17 1613    Subjective  Patient arrived late for her appointment. She stated that she is not having any pain currently. Patient stated that she had a headache yesterday. She denied any dizziness currently.     How long can you stand comfortably?  15 minutes    How long can you walk comfortably?  15-20 minutes    Patient Stated Goals  To walk normal     Currently in Pain?  No/denies         [facility] PT Assessment - 11/23/17 0001      Assessment   Medical Diagnosis   S/P Left Total hip Arthroplasty    Referring Provider  [name], MD    Onset Date/Surgical Date  10/17/17    Next MD Visit  11/27/17      Strength   Right Hip Flexion  4+/5    Right Hip ABduction  4+/5    Left Hip Flexion  4-/5    Left Hip ABduction  4/5   was 3-/5   Right Knee Flexion  5/5    Right Knee Extension  5/5    Left Knee Flexion  4+/5    Left Knee Extension  4+/5    Right Ankle Dorsiflexion  5/5    Left Ankle Dorsiflexion  4+/5      Static Standing Balance   Static Standing - Balance Support  No upper extremity supported    Static Standing Balance -  Activities   Single Leg Stance - Right Leg;Single Leg Stance - Left Leg    Static Standing - Comment/# of Minutes  2 seconds on the right, 1 second on the left                   [facility] Adult PT Treatment/Exercise - 11/23/17 0001      Ambulation/Gait   Ambulation/Gait  Yes    Ambulation Distance (Feet)  282 Feet   2MWT   Assistive device  Rolling walker    Gait Pattern  Decreased hip/knee flexion - left;Decreased stance time - left;Antalgic    Ambulation Surface  Level;Indoor    Gait velocity  0.72      Knee/Hip Exercises: Standing   Heel Raises  20 reps    Heel Raises Limitations  toe raises on incline    Hip Flexion  Stengthening;Both;1 set;20 reps;Knee bent   Alternating marching 3'' holds with HHA   Functional Squat  1 set;10 reps;Limitations    Functional Squat Limitations  minisquat with BUE A. Max verbal cues and tactile cues for form    Other Standing Knee Exercises  Sidestepping outside of parallel bars 14 feet x 2 roundtrips. tandem stance on foam 2x 30"      Knee/Hip Exercises: Seated   Sit to Sand  10 reps;without UE support   from 22 inch mat for improved mechanics     Knee/Hip Exercises: Supine   Bridges  Both;20 reps    Bridges Limitations  2-3 sec hold    Straight Leg Raises  Strengthening;Left;1 set;10 reps             PT Education - 11/23/17 1657    Education Details   Educated patient on re-assessment findings    Person(s) Educated  Patient    Methods  Explanation    Comprehension  Verbalized understanding       PT Short  Term Goals - 11/23/17 1700      PT SHORT TERM GOAL #1   Title  Patient will demonstrate understanding and report regular compliance of HEP in order to improve functional mobility.     Baseline  11/23/17: Patient reported trying to do exercises, but did not do them yesterday.     Time  3    Period  Weeks    Status  On-going      PT SHORT TERM GOAL #2   Title  Patient will demonstrate ability to perform single limb stance for 10 seconds on each lower extremity to improve safety and assist with ambulation and stair navigation.     Baseline  11/23/17: 2 seconds right, 1 second on the left    Time  3    Period  Weeks    Status  On-going      PT SHORT TERM GOAL #3   Title  Patient will demonstrate improvement of 1/2 grade on MMT strength testing in all deficient muscle groups except hip extension in which patient will be able to maintain full resistance in standing, in order to improve patient's ability to ambulate with improved mechanics.     Baseline  11/23/17: See MMT. Improved in some but not all.     Time  3    Period  Weeks    Status  On-going        PT Long Term Goals - 11/23/17 1701      PT LONG TERM GOAL #1   Title  Patient will demonstrate left hip AROM within functional limits to assist with ambulation mechanics and safety.     Time  6    Period  Weeks    Status  Deferred      PT LONG TERM GOAL #2   Title  Patient will demonstrate improvement of 1 grade on MMT strength testing in all deficient muscle groups except hip extension in which patient will be able to maintain full resistance in standing, in order to improve patient's ability to ambulate with improved mechanics.     Baseline  11/23/17: Patient demonstrated improvement some, but not by 1 MMT grade. See MMT.     Time  6    Period  Weeks    Status  On-going       PT LONG TERM GOAL #3   Title  Patient will demonstrate ability to ambulate at a gait velocity of 0.8 m/s with LRAD indicating improved safety with household ambulation.     Baseline  11/23/17: Patient has demonstrated improvement in gait velocity, but is not ambulating at 0.8 m/s.     Time  6    Period  Weeks    Status  On-going      PT LONG TERM GOAL #4   Title  Patient will demonstrate an improvement of 10% on FOTO indicating an improvement in percieved functional mobility.     Time  6    Period  Weeks    Status  Deferred      PT LONG TERM GOAL #5   Title  Patient will demonstrate improvement of 10 seconds on the 5xSTS test indicating improved balance and overall functional mobility.     Time  6    Period  Weeks    Status  Deferred            11/23/17 6812  Plan  Clinical Impression Statement This session performed a re-assessment of patient's progress towards goals. Patient is  on-going with all short term and long term goals. Patient has demonstrated some improvement in strength, but overall has not met goals. This session continued to focus on functional strengthening. Patient would benefit from continued skilled physical therapy in order to continue progressing towards functional goals.    Pt will benefit from skilled therapeutic intervention in order to improve on the following deficits Abnormal gait;Decreased balance;Decreased mobility;Difficulty walking;Decreased range of motion;Improper body mechanics;Decreased activity tolerance;Decreased knowledge of use of DME;Decreased strength;Pain  Rehab Potential Fair  Clinical Impairments Affecting Rehab Potential Positive: Motivated; Negative: comorbidities  PT Frequency 3x / week  PT Duration 6 weeks  PT Treatment/Interventions ADLs/Self Care Home Management;Aquatic Therapy;DME Instruction;Gait training;Stair training;Functional mobility training;Therapeutic activities;Therapeutic exercise;Balance training;Neuromuscular  re-education;Patient/family education;Orthotic Fit/Training;Manual techniques;Scar mobilization;Passive range of motion;Dry needling;Energy conservation;Taping  PT Next Visit Plan Continue with sit to stands from elevated mat for improved mechanics and lower mat to progress. Add sidestepping next session.   Functional lower extremity strengthening exercises and exercises and mobility exercises.   PT Home Exercise Plan Review exercises patient has been performing at home and upadate as needed at first treatment; 11/13/17 added functional squat, standing hip ext, standing and seated heel/toe raises.        Patient will benefit from skilled therapeutic intervention in order to improve the following deficits and impairments:     Visit Diagnosis: Muscle weakness (generalized)  Pain in left hip  Other symptoms and signs involving the musculoskeletal system  Other abnormalities of gait and mobility     Problem List Patient Active Problem List   Diagnosis Date Noted  . Primary osteoarthritis of left hip 10/17/2017  . Osteoarthritis of left hip 10/16/2017  . Preop cardiovascular exam 09/10/2017  . Orthopnea 09/10/2017  . Right carotid bruit 10/25/2013  . Diabetes mellitus type 2 in obese ([place])   . Hyperlipidemia with target LDL less than 70   . Orthostatic hypotension 08/28/2013  . Renal insufficiency 08/19/2013  . Obesity (BMI 30-39.9) 05/05/2013  . CAD S/P percutaneous coronary angioplasty -- LAD/OM1 DES 02/17/13 02/18/2013  . Pulmonary embolism August 2011 (syncope) 02/18/2013  . NSTEMI (non-ST elevated myocardial infarction) ([place]) 02/15/2013  . Essential hypertension 02/15/2013   Clarene Critchley PT, DPT 5:06 PM, 11/23/17 [place], [place], [address] Phone: [phone]   Fax:  [phone]  Name: RETAL TONKINSON MRN: [id number] Date of Birth: 1945/06/14

## 2017-11-27 ENCOUNTER — Encounter (HOSPITAL_COMMUNITY): Payer: Medicare Other

## 2017-11-27 DIAGNOSIS — Z96642 Presence of left artificial hip joint: Secondary | ICD-10-CM | POA: Diagnosis not present

## 2017-11-28 ENCOUNTER — Ambulatory Visit (HOSPITAL_COMMUNITY): Payer: Medicare Other

## 2017-11-28 ENCOUNTER — Encounter (HOSPITAL_COMMUNITY): Payer: Self-pay

## 2017-11-28 DIAGNOSIS — M25552 Pain in left hip: Secondary | ICD-10-CM

## 2017-11-28 DIAGNOSIS — R29898 Other symptoms and signs involving the musculoskeletal system: Secondary | ICD-10-CM | POA: Diagnosis not present

## 2017-11-28 DIAGNOSIS — M6281 Muscle weakness (generalized): Secondary | ICD-10-CM

## 2017-11-28 DIAGNOSIS — R2689 Other abnormalities of gait and mobility: Secondary | ICD-10-CM

## 2017-11-28 DIAGNOSIS — M1612 Unilateral primary osteoarthritis, left hip: Secondary | ICD-10-CM | POA: Diagnosis not present

## 2017-11-28 DIAGNOSIS — R269 Unspecified abnormalities of gait and mobility: Secondary | ICD-10-CM | POA: Diagnosis not present

## 2017-11-28 NOTE — Therapy (Signed)
[place], [place], [address] Phone: [phone]   Fax:  [phone]  Physical Therapy Treatment  Patient Details  Name: Phyllis Conner MRN: [id number] Date of Birth: 1946/04/09 Referring Provider: [name], MD   Encounter Date: 11/28/2017  PT End of Session - 11/28/17 1740    Visit Number  7    Number of Visits  19    Date for PT Re-Evaluation  12/21/17   MInireassess complete 11/23/17   Authorization Type  Primary: UHC Medicare; secondary: Medicaid    Authorization Time Period  11/08/17 - 12/21/17    Authorization - Visit Number  7    Authorization - Number of Visits  10    PT Start Time  4580    PT Stop Time  9983    PT Time Calculation (min)  38 min    Equipment Utilized During Treatment  Gait belt    Activity Tolerance  Patient tolerated treatment well;Patient limited by fatigue;No increased pain    Behavior During Therapy  WFL for tasks assessed/performed       Past Medical History:  Diagnosis Date  . Acid reflux   . Anemia    "when I was younger"  . Aortic stenosis    mild AS 09/14/17 echo  . Arthritis   . CAD S/P percutaneous coronary angioplasty November 2014   Mid LAD 2.5 mm x 12 mm Promus DES (2.70 mm); proximal OM1 - Promus DES 2.75 mm x 16 mm (2.85 mm)  . Diabetes mellitus type 2 in obese ([place])   . Essential hypertension   . Headache   . Hyperlipidemia with target LDL less than 70   . Non-Q wave ST elevation myocardial infarction (STEMI) involving left anterior descending (LAD) coronary artery November 2014   PCI to LAD and circumflex  . Obesity (BMI 30-39.9) 05/05/2013    Past Surgical History:  Procedure Laterality Date  . ABDOMINAL HYSTERECTOMY  1990s  . Arthroscopic knee surgery Bilateral 1984, 2002   Arthroscopic  . CARDIAC CATHETERIZATION  November 2014   Mid LAD 95%, proximal OM1 80-90% --> two-vessel PCI  . COLONOSCOPY N/A 12/05/2013   Procedure: COLONOSCOPY;  Surgeon: Beryle Beams, MD;  Location: [facility];  Service: Endoscopy;  Laterality: N/A;  . COLONOSCOPY WITH PROPOFOL N/A 12/11/2014   Procedure: COLONOSCOPY WITH PROPOFOL;  Surgeon: Carol Ada, MD;  Location: [facility];  Service: Endoscopy;  Laterality: N/A;  . ESOPHAGOGASTRODUODENOSCOPY N/A 12/05/2013   Procedure: ESOPHAGOGASTRODUODENOSCOPY (EGD);  Surgeon: Beryle Beams, MD;  Location: [facility];  Service: Endoscopy;  Laterality: N/A;  . LEFT HEART CATHETERIZATION WITH CORONARY ANGIOGRAM N/A 02/17/2013   Procedure: LEFT HEART CATHETERIZATION WITH CORONARY ANGIOGRAM;  Surgeon: Sanda Klein, MD;  Location: [facility];  Service: Cardiovascular;  Laterality: N/A;  . PERCUTANEOUS CORONARY STENT INTERVENTION (PCI-S)  November 2014   Mid LAD 2.5 mm x 12 mm (2.75 mm) Promus DES; OM1 2.75 mm x 60 mm (2.85 mm) Promus DES  . TOTAL HIP ARTHROPLASTY Left 10/17/2017  . TOTAL HIP ARTHROPLASTY Left 10/17/2017   Procedure: TOTAL HIP ARTHROPLASTY ANTERIOR APPROACH;  Surgeon: [name], MD;  Location: [facility];  Service: Orthopedics;  Laterality: Left;  . TRANSTHORACIC ECHOCARDIOGRAM  02/16/2013   EF 40-50%. Moderate concentric LVH. Moderate H. Shea of mid-distal septal and apical region. Grade 1 diastolic dysfunction. Moderate aortic sclerosis without stenosis.    There were no vitals filed for this visit.  Subjective Assessment - 11/28/17  1739    Subjective  Went to MD yesterday and happy with progress.  No reports of pain today.  Stated most difficulty putting on socks and shoes.      Pertinent History  History of arthroscopic knee surgery; Left total hip replacement 10/17/17    Patient Stated Goals  To walk normal     Currently in Pain?  No/denies                       [facility] Adult PT Treatment/Exercise - 11/28/17 0001      Exercises   Exercises  Knee/Hip      Knee/Hip Exercises: Standing   Heel Raises  20 reps    Heel Raises Limitations  toe raises on incline    Hip Flexion   Stengthening;Both;1 set;20 reps;Knee bent   alternating marching with 3" holds 2 then 1 HHA   Forward Lunges  Both;15 reps;Limitations    Forward Lunges Limitations  6in step    Side Lunges  Both;15 reps    Side Lunges Limitations  6in step    Functional Squat  1 set;10 reps;Limitations    Functional Squat Limitations  minisquat with BUE A. Max verbal cues and tactile cues for form    SLS  Rt 5", Lt 1-2" max of 5    Other Standing Knee Exercises  sidestep with RTB 2RT    Other Standing Knee Exercises  tandem stance 2x 30" on foam               PT Short Term Goals - 11/23/17 1700      PT SHORT TERM GOAL #1   Title  Patient will demonstrate understanding and report regular compliance of HEP in order to improve functional mobility.     Baseline  11/23/17: Patient reported trying to do exercises, but did not do them yesterday.     Time  3    Period  Weeks    Status  On-going      PT SHORT TERM GOAL #2   Title  Patient will demonstrate ability to perform single limb stance for 10 seconds on each lower extremity to improve safety and assist with ambulation and stair navigation.     Baseline  11/23/17: 2 seconds right, 1 second on the left    Time  3    Period  Weeks    Status  On-going      PT SHORT TERM GOAL #3   Title  Patient will demonstrate improvement of 1/2 grade on MMT strength testing in all deficient muscle groups except hip extension in which patient will be able to maintain full resistance in standing, in order to improve patient's ability to ambulate with improved mechanics.     Baseline  11/23/17: See MMT. Improved in some but not all.     Time  3    Period  Weeks    Status  On-going        PT Long Term Goals - 11/23/17 1701      PT LONG TERM GOAL #1   Title  Patient will demonstrate left hip AROM within functional limits to assist with ambulation mechanics and safety.     Time  6    Period  Weeks    Status  Deferred      PT LONG TERM GOAL #2   Title   Patient will demonstrate improvement of 1 grade on MMT strength testing in all deficient muscle groups except  hip extension in which patient will be able to maintain full resistance in standing, in order to improve patient's ability to ambulate with improved mechanics.     Baseline  11/23/17: Patient demonstrated improvement some, but not by 1 MMT grade. See MMT.     Time  6    Period  Weeks    Status  On-going      PT LONG TERM GOAL #3   Title  Patient will demonstrate ability to ambulate at a gait velocity of 0.8 m/s with LRAD indicating improved safety with household ambulation.     Baseline  11/23/17: Patient has demonstrated improvement in gait velocity, but is not ambulating at 0.8 m/s.     Time  6    Period  Weeks    Status  On-going      PT LONG TERM GOAL #4   Title  Patient will demonstrate an improvement of 10% on FOTO indicating an improvement in percieved functional mobility.     Time  6    Period  Weeks    Status  Deferred      PT LONG TERM GOAL #5   Title  Patient will demonstrate improvement of 10 seconds on the 5xSTS test indicating improved balance and overall functional mobility.     Time  6    Period  Weeks    Status  Deferred            Plan - 11/28/17 1814    Clinical Impression Statement  Added forward and lateral lunges for functional strengthening.  Pt able to complete all exercises with no reports of pain, did require cueing for proper form with new exercises.  Pt continues to have difficulty wiht balalnce and functional strengthening exercises due to weakness through is progressing well.  Encouraged to add SLS to HEP.      Rehab Potential  Fair    Clinical Impairments Affecting Rehab Potential  Positive: Motivated; Negative: comorbidities    PT Frequency  3x / week    PT Duration  6 weeks    PT Treatment/Interventions  ADLs/Self Care Home Management;Aquatic Therapy;DME Instruction;Gait training;Stair training;Functional mobility training;Therapeutic  activities;Therapeutic exercise;Balance training;Neuromuscular re-education;Patient/family education;Orthotic Fit/Training;Manual techniques;Scar mobilization;Passive range of motion;Dry needling;Energy conservation;Taping    PT Next Visit Plan  Continue progress functional strengthening and balalnce.  Add 3D hip excursion next session to address mobility to increase ease with donning socks/shoes.     PT Home Exercise Plan  Review exercises patient has been performing at home and upadate as needed at first treatment; 11/13/17 added functional squat, standing hip ext, standing and seated heel/toe raises.        Patient will benefit from skilled therapeutic intervention in order to improve the following deficits and impairments:  Abnormal gait, Decreased balance, Decreased mobility, Difficulty walking, Decreased range of motion, Improper body mechanics, Decreased activity tolerance, Decreased knowledge of use of DME, Decreased strength, Pain  Visit Diagnosis: Muscle weakness (generalized)  Other abnormalities of gait and mobility  Other symptoms and signs involving the musculoskeletal system  Pain in left hip     Problem List Patient Active Problem List   Diagnosis Date Noted  . Primary osteoarthritis of left hip 10/17/2017  . Osteoarthritis of left hip 10/16/2017  . Preop cardiovascular exam 09/10/2017  . Orthopnea 09/10/2017  . Right carotid bruit 10/25/2013  . Diabetes mellitus type 2 in obese ([place])   . Hyperlipidemia with target LDL less than 70   . Orthostatic hypotension 08/28/2013  .  Renal insufficiency 08/19/2013  . Obesity (BMI 30-39.9) 05/05/2013  . CAD S/P percutaneous coronary angioplasty -- LAD/OM1 DES 02/17/13 02/18/2013  . Pulmonary embolism August 2011 (syncope) 02/18/2013  . NSTEMI (non-ST elevated myocardial infarction) ([place]) 02/15/2013  . Essential hypertension 02/15/2013   Ihor Austin, [place]; [place]  Aldona Lento 11/28/2017, 6:22  PM  [facility] [address] [place], [place], [address] Phone: [phone]   Fax:  [phone]  Name: Phyllis Conner MRN: [id number] Date of Birth: Jul 13, 1945

## 2017-11-29 ENCOUNTER — Telehealth (HOSPITAL_COMMUNITY): Payer: Self-pay | Admitting: Family Medicine

## 2017-11-29 ENCOUNTER — Ambulatory Visit (HOSPITAL_COMMUNITY): Payer: Medicare Other | Admitting: Physical Therapy

## 2017-11-29 NOTE — Telephone Encounter (Signed)
11/29/17  pt left a message to cx said that her ride cancelled last minute and she did want someone to call her back

## 2017-11-30 ENCOUNTER — Encounter (HOSPITAL_COMMUNITY): Payer: Self-pay | Admitting: Physical Therapy

## 2017-11-30 ENCOUNTER — Ambulatory Visit (HOSPITAL_COMMUNITY): Payer: Medicare Other | Admitting: Physical Therapy

## 2017-11-30 DIAGNOSIS — M25552 Pain in left hip: Secondary | ICD-10-CM | POA: Diagnosis not present

## 2017-11-30 DIAGNOSIS — R2689 Other abnormalities of gait and mobility: Secondary | ICD-10-CM

## 2017-11-30 DIAGNOSIS — R269 Unspecified abnormalities of gait and mobility: Secondary | ICD-10-CM | POA: Diagnosis not present

## 2017-11-30 DIAGNOSIS — R29898 Other symptoms and signs involving the musculoskeletal system: Secondary | ICD-10-CM | POA: Diagnosis not present

## 2017-11-30 DIAGNOSIS — M1612 Unilateral primary osteoarthritis, left hip: Secondary | ICD-10-CM | POA: Diagnosis not present

## 2017-11-30 DIAGNOSIS — M6281 Muscle weakness (generalized): Secondary | ICD-10-CM | POA: Diagnosis not present

## 2017-11-30 NOTE — Therapy (Signed)
[place], [place], [address] Phone: [phone]   Fax:  [phone]  Physical Therapy Treatment  Patient Details  Name: Phyllis Conner MRN: [id number] Date of Birth: 1945-08-29 Referring Provider: [name], MD   Encounter Date: 11/30/2017  PT End of Session - 11/30/17 1627    Visit Number  8    Number of Visits  19    Date for PT Re-Evaluation  12/21/17   MInireassess complete 11/23/17   Authorization Type  Primary: UHC Medicare; secondary: Medicaid    Authorization Time Period  11/08/17 - 12/21/17    Authorization - Visit Number  8    Authorization - Number of Visits  10    PT Start Time  6962    PT Stop Time  1649    PT Time Calculation (min)  40 min    Equipment Utilized During Treatment  Gait belt    Activity Tolerance  Patient tolerated treatment well;Patient limited by fatigue;No increased pain    Behavior During Therapy  WFL for tasks assessed/performed       Past Medical History:  Diagnosis Date  . Acid reflux   . Anemia    "when I was younger"  . Aortic stenosis    mild AS 09/14/17 echo  . Arthritis   . CAD S/P percutaneous coronary angioplasty November 2014   Mid LAD 2.5 mm x 12 mm Promus DES (2.70 mm); proximal OM1 - Promus DES 2.75 mm x 16 mm (2.85 mm)  . Diabetes mellitus type 2 in obese ([place])   . Essential hypertension   . Headache   . Hyperlipidemia with target LDL less than 70   . Non-Q wave ST elevation myocardial infarction (STEMI) involving left anterior descending (LAD) coronary artery November 2014   PCI to LAD and circumflex  . Obesity (BMI 30-39.9) 05/05/2013    Past Surgical History:  Procedure Laterality Date  . ABDOMINAL HYSTERECTOMY  1990s  . Arthroscopic knee surgery Bilateral 1984, 2002   Arthroscopic  . CARDIAC CATHETERIZATION  November 2014   Mid LAD 95%, proximal OM1 80-90% --> two-vessel PCI  . COLONOSCOPY N/A 12/05/2013   Procedure: COLONOSCOPY;  Surgeon: Phyllis Beams, MD;  Location: [facility];  Service: Endoscopy;  Laterality: N/A;  . COLONOSCOPY WITH PROPOFOL N/A 12/11/2014   Procedure: COLONOSCOPY WITH PROPOFOL;  Surgeon: Phyllis Ada, MD;  Location: [facility];  Service: Endoscopy;  Laterality: N/A;  . ESOPHAGOGASTRODUODENOSCOPY N/A 12/05/2013   Procedure: ESOPHAGOGASTRODUODENOSCOPY (EGD);  Surgeon: Phyllis Beams, MD;  Location: [facility];  Service: Endoscopy;  Laterality: N/A;  . LEFT HEART CATHETERIZATION WITH CORONARY ANGIOGRAM N/A 02/17/2013   Procedure: LEFT HEART CATHETERIZATION WITH CORONARY ANGIOGRAM;  Surgeon: Phyllis Klein, MD;  Location: [facility];  Service: Cardiovascular;  Laterality: N/A;  . PERCUTANEOUS CORONARY STENT INTERVENTION (PCI-S)  November 2014   Mid LAD 2.5 mm x 12 mm (2.75 mm) Promus DES; OM1 2.75 mm x 60 mm (2.85 mm) Promus DES  . TOTAL HIP ARTHROPLASTY Left 10/17/2017  . TOTAL HIP ARTHROPLASTY Left 10/17/2017   Procedure: TOTAL HIP ARTHROPLASTY ANTERIOR APPROACH;  Surgeon: Phyllis Pear, MD;  Location: [facility];  Service: Orthopedics;  Laterality: Left;  . TRANSTHORACIC ECHOCARDIOGRAM  02/16/2013   EF 40-50%. Moderate concentric LVH. Moderate H. Shea of mid-distal septal and apical region. Grade 1 diastolic dysfunction. Moderate aortic sclerosis without stenosis.    There were no vitals filed for this visit.  Subjective Assessment - 11/30/17  1611    Subjective  Patient stated that her physician told her she is doing well and released her. Patient reported pain in bilateral knees.     Pertinent History  History of arthroscopic knee surgery; Left total hip replacement 10/17/17    Patient Stated Goals  To walk normal     Currently in Pain?  Yes    Pain Score  5     Pain Location  Knee    Pain Orientation  Right;Left    Pain Descriptors / Indicators  Aching    Pain Type  Chronic pain   History of this knee pain that comes and goes                      [facility] Adult PT Treatment/Exercise - 11/30/17  0001      Ambulation/Gait   Ambulation Distance (Feet)  226 Feet    Assistive device  Straight cane    Gait Pattern  Step-through pattern;Trendelenburg    Ambulation Surface  Level;Indoor    Gait Comments  Demonstration and verbal cues for sequencing with SPC. Discussed with patient how to adjust the cane.       Knee/Hip Exercises: Standing   Heel Raises  20 reps    Heel Raises Limitations  toe raises on incline    Hip Flexion  Stengthening;Both;1 set;20 reps;Knee bent   Alternating marching 2-3 seconds 2 HHA   Forward Lunges  Both;Limitations;20 reps    Forward Lunges Limitations  6in step. Holding 2HHA and 1HHA    Side Lunges  Both;15 reps    Side Lunges Limitations  6in step    Functional Squat  1 set;10 reps;Limitations    Functional Squat Limitations  minisquat with BUE A. Max verbal cues and tactile cues for form    Other Standing Knee Exercises  sidestep with RTB 2RT 14 feet    Other Standing Knee Exercises  tandem stance 2x 30" on foam               PT Short Term Goals - 11/23/17 1700      PT SHORT TERM GOAL #1   Title  Patient will demonstrate understanding and report regular compliance of HEP in order to improve functional mobility.     Baseline  11/23/17: Patient reported trying to do exercises, but did not do them yesterday.     Time  3    Period  Weeks    Status  On-going      PT SHORT TERM GOAL #2   Title  Patient will demonstrate ability to perform single limb stance for 10 seconds on each lower extremity to improve safety and assist with ambulation and stair navigation.     Baseline  11/23/17: 2 seconds right, 1 second on the left    Time  3    Period  Weeks    Status  On-going      PT SHORT TERM GOAL #3   Title  Patient will demonstrate improvement of 1/2 grade on MMT strength testing in all deficient muscle groups except hip extension in which patient will be able to maintain full resistance in standing, in order to improve patient's ability to  ambulate with improved mechanics.     Baseline  11/23/17: See MMT. Improved in some but not all.     Time  3    Period  Weeks    Status  On-going        PT Long  Term Goals - 11/23/17 1701      PT LONG TERM GOAL #1   Title  Patient will demonstrate left hip AROM within functional limits to assist with ambulation mechanics and safety.     Time  6    Period  Weeks    Status  Deferred      PT LONG TERM GOAL #2   Title  Patient will demonstrate improvement of 1 grade on MMT strength testing in all deficient muscle groups except hip extension in which patient will be able to maintain full resistance in standing, in order to improve patient's ability to ambulate with improved mechanics.     Baseline  11/23/17: Patient demonstrated improvement some, but not by 1 MMT grade. See MMT.     Time  6    Period  Weeks    Status  On-going      PT LONG TERM GOAL #3   Title  Patient will demonstrate ability to ambulate at a gait velocity of 0.8 m/s with LRAD indicating improved safety with household ambulation.     Baseline  11/23/17: Patient has demonstrated improvement in gait velocity, but is not ambulating at 0.8 m/s.     Time  6    Period  Weeks    Status  On-going      PT LONG TERM GOAL #4   Title  Patient will demonstrate an improvement of 10% on FOTO indicating an improvement in percieved functional mobility.     Time  6    Period  Weeks    Status  Deferred      PT LONG TERM GOAL #5   Title  Patient will demonstrate improvement of 10 seconds on the 5xSTS test indicating improved balance and overall functional mobility.     Time  6    Period  Weeks    Status  Deferred            Plan - 11/30/17 1658    Clinical Impression Statement  This session continued with established plan of care. This session incorporated gait training to transition patient from the walker to the single point cane. Therapist provided education on how to properly hold the cane and sequencing of the cane with  walking. Therapist also described appropriate adjustment of cane to patient and told patient to bring her cane to next session so that the therapist could adjust her cane appropriately. The remainder of session continued to focus on exercises to improve patient's lower extremity strength, stability, and balance.    Rehab Potential  Fair    Clinical Impairments Affecting Rehab Potential  Positive: Motivated; Negative: comorbidities    PT Frequency  3x / week    PT Duration  6 weeks    PT Treatment/Interventions  ADLs/Self Care Home Management;Aquatic Therapy;DME Instruction;Gait training;Stair training;Functional mobility training;Therapeutic activities;Therapeutic exercise;Balance training;Neuromuscular re-education;Patient/family education;Orthotic Fit/Training;Manual techniques;Scar mobilization;Passive range of motion;Dry needling;Energy conservation;Taping    PT Next Visit Plan  Adjust patient's cane for her to use at home. Continue progress functional strengthening and balalnce.  Add 3D hip excursion next session to address mobility to increase ease with donning socks/shoes.     PT Home Exercise Plan  Review exercises patient has been performing at home and upadate as needed at first treatment; 11/13/17 added functional squat, standing hip ext, standing and seated heel/toe raises.        Patient will benefit from skilled therapeutic intervention in order to improve the following deficits and impairments:  Abnormal gait,  Decreased balance, Decreased mobility, Difficulty walking, Decreased range of motion, Improper body mechanics, Decreased activity tolerance, Decreased knowledge of use of DME, Decreased strength, Pain  Visit Diagnosis: Muscle weakness (generalized)  Other abnormalities of gait and mobility  Other symptoms and signs involving the musculoskeletal system  Pain in left hip     Problem List Patient Active Problem List   Diagnosis Date Noted  . Primary osteoarthritis of left  hip 10/17/2017  . Osteoarthritis of left hip 10/16/2017  . Preop cardiovascular exam 09/10/2017  . Orthopnea 09/10/2017  . Right carotid bruit 10/25/2013  . Diabetes mellitus type 2 in obese ([place])   . Hyperlipidemia with target LDL less than 70   . Orthostatic hypotension 08/28/2013  . Renal insufficiency 08/19/2013  . Obesity (BMI 30-39.9) 05/05/2013  . CAD S/P percutaneous coronary angioplasty -- LAD/OM1 DES 02/17/13 02/18/2013  . Pulmonary embolism August 2011 (syncope) 02/18/2013  . NSTEMI (non-ST elevated myocardial infarction) ([place]) 02/15/2013  . Essential hypertension 02/15/2013   Clarene Critchley PT, DPT 5:01 PM, 11/30/17 [place], [place], [address] Phone: [phone]   Fax:  [phone]  Name: MILLISA GIARRUSSO MRN: [id number] Date of Birth: Dec 01, 1945

## 2017-12-03 ENCOUNTER — Other Ambulatory Visit: Payer: Self-pay | Admitting: Cardiology

## 2017-12-03 DIAGNOSIS — I2510 Atherosclerotic heart disease of native coronary artery without angina pectoris: Secondary | ICD-10-CM

## 2017-12-03 DIAGNOSIS — Z9861 Coronary angioplasty status: Principal | ICD-10-CM

## 2017-12-04 ENCOUNTER — Ambulatory Visit (HOSPITAL_COMMUNITY): Payer: Medicare Other | Admitting: Physical Therapy

## 2017-12-04 DIAGNOSIS — M6281 Muscle weakness (generalized): Secondary | ICD-10-CM

## 2017-12-04 DIAGNOSIS — R269 Unspecified abnormalities of gait and mobility: Secondary | ICD-10-CM

## 2017-12-04 DIAGNOSIS — M25552 Pain in left hip: Secondary | ICD-10-CM | POA: Diagnosis not present

## 2017-12-04 DIAGNOSIS — R29898 Other symptoms and signs involving the musculoskeletal system: Secondary | ICD-10-CM

## 2017-12-04 DIAGNOSIS — M1612 Unilateral primary osteoarthritis, left hip: Secondary | ICD-10-CM

## 2017-12-04 DIAGNOSIS — R2689 Other abnormalities of gait and mobility: Secondary | ICD-10-CM

## 2017-12-04 NOTE — Therapy (Signed)
[place], [place], [address] Phone: [phone]   Fax:  [phone]  Physical Therapy Treatment  Patient Details  Name: Phyllis Conner MRN: [id number] Date of Birth: [birth year] Referring Provider: [name], MD   Encounter Date: 12/04/2017  PT End of Session - 12/04/17 1559    Visit Number  9    Number of Visits  19    Date for PT Re-Evaluation  12/21/17   MInireassess complete 11/23/17   Authorization Type  Primary: UHC Medicare; secondary: Medicaid    Authorization Time Period  11/08/17 - 12/21/17    Authorization - Visit Number  9    Authorization - Number of Visits  10    PT Start Time  1522    PT Stop Time  1606    PT Time Calculation (min)  44 min    Equipment Utilized During Treatment  Gait belt    Activity Tolerance  Patient tolerated treatment well;Patient limited by fatigue;No increased pain    Behavior During Therapy  WFL for tasks assessed/performed       Past Medical History:  Diagnosis Date  . Acid reflux   . Anemia    "when I was younger"  . Aortic stenosis    mild AS 09/14/17 echo  . Arthritis   . CAD S/P percutaneous coronary angioplasty November 2014   Mid LAD 2.5 mm x 12 mm Promus DES (2.70 mm); proximal OM1 - Promus DES 2.75 mm x 16 mm (2.85 mm)  . Diabetes mellitus type 2 in obese ([place])   . Essential hypertension   . Headache   . Hyperlipidemia with target LDL less than 70   . Non-Q wave ST elevation myocardial infarction (STEMI) involving left anterior descending (LAD) coronary artery November 2014   PCI to LAD and circumflex  . Obesity (BMI 30-39.9) 05/05/2013    Past Surgical History:  Procedure Laterality Date  . ABDOMINAL HYSTERECTOMY  1990s  . Arthroscopic knee surgery Bilateral 1984, 2002   Arthroscopic  . CARDIAC CATHETERIZATION  November 2014   Mid LAD 95%, proximal OM1 80-90% --> two-vessel PCI  . COLONOSCOPY N/A 12/05/2013   Procedure: COLONOSCOPY;  Surgeon: Beryle Beams, MD;  Location: [facility];  Service: Endoscopy;  Laterality: N/A;  . COLONOSCOPY WITH PROPOFOL N/A 12/11/2014   Procedure: COLONOSCOPY WITH PROPOFOL;  Surgeon: Carol Ada, MD;  Location: [facility];  Service: Endoscopy;  Laterality: N/A;  . ESOPHAGOGASTRODUODENOSCOPY N/A 12/05/2013   Procedure: ESOPHAGOGASTRODUODENOSCOPY (EGD);  Surgeon: Beryle Beams, MD;  Location: [facility];  Service: Endoscopy;  Laterality: N/A;  . LEFT HEART CATHETERIZATION WITH CORONARY ANGIOGRAM N/A 02/17/2013   Procedure: LEFT HEART CATHETERIZATION WITH CORONARY ANGIOGRAM;  Surgeon: Sanda Klein, MD;  Location: [facility];  Service: Cardiovascular;  Laterality: N/A;  . PERCUTANEOUS CORONARY STENT INTERVENTION (PCI-S)  November 2014   Mid LAD 2.5 mm x 12 mm (2.75 mm) Promus DES; OM1 2.75 mm x 60 mm (2.85 mm) Promus DES  . TOTAL HIP ARTHROPLASTY Left 10/17/2017  . TOTAL HIP ARTHROPLASTY Left 10/17/2017   Procedure: TOTAL HIP ARTHROPLASTY ANTERIOR APPROACH;  Surgeon: [name], MD;  Location: [facility];  Service: Orthopedics;  Laterality: Left;  . TRANSTHORACIC ECHOCARDIOGRAM  02/16/2013   EF 40-50%. Moderate concentric LVH. Moderate H. Shea of mid-distal septal and apical region. Grade 1 diastolic dysfunction. Moderate aortic sclerosis without stenosis.    There were no vitals filed for this visit.  Subjective Assessment - 12/04/17  1532    Subjective  Pt states minimal pain 3/10.  states she is walking more at home.     Currently in Pain?  Yes    Pain Score  3     Pain Location  Knee    Pain Orientation  Right;Left    Pain Descriptors / Indicators  Aching    Pain Type  Chronic pain                       [facility] Adult PT Treatment/Exercise - 12/04/17 0001      Ambulation/Gait   Gait Comments  gt with SPC 226 feet      Knee/Hip Exercises: Standing   Heel Raises  20 reps    Heel Raises Limitations  toe raises on incline    Forward Lunges  Both;Limitations;20 reps    Forward Lunges  Limitations  4in step. Holding 2HHA and 1HHA    Side Lunges  Both;15 reps    Side Lunges Limitations  4in step    Functional Squat  1 set;Limitations;15 reps    Functional Squat Limitations  minisquat with BUE A. Max verbal cues and tactile cues for form    SLS  Rt 5", Lt 1-2" max of 5    SLS with Vectors  both 5X5" each with 1 UE assist    Other Standing Knee Exercises  3D hip excursions 10 reps      Knee/Hip Exercises: Seated   Sit to Sand  10 reps;without UE support      Knee/Hip Exercises: Supine   Bridges  Both;20 reps    Bridges Limitations  2-3 sec hold    Straight Leg Raises  Strengthening;1 set;10 reps;Both               PT Short Term Goals - 11/23/17 1700      PT SHORT TERM GOAL #1   Title  Patient will demonstrate understanding and report regular compliance of HEP in order to improve functional mobility.     Baseline  11/23/17: Patient reported trying to do exercises, but did not do them yesterday.     Time  3    Period  Weeks    Status  On-going      PT SHORT TERM GOAL #2   Title  Patient will demonstrate ability to perform single limb stance for 10 seconds on each lower extremity to improve safety and assist with ambulation and stair navigation.     Baseline  11/23/17: 2 seconds right, 1 second on the left    Time  3    Period  Weeks    Status  On-going      PT SHORT TERM GOAL #3   Title  Patient will demonstrate improvement of 1/2 grade on MMT strength testing in all deficient muscle groups except hip extension in which patient will be able to maintain full resistance in standing, in order to improve patient's ability to ambulate with improved mechanics.     Baseline  11/23/17: See MMT. Improved in some but not all.     Time  3    Period  Weeks    Status  On-going        PT Long Term Goals - 11/23/17 1701      PT LONG TERM GOAL #1   Title  Patient will demonstrate left hip AROM within functional limits to assist with ambulation mechanics and safety.      Time  6  Period  Weeks    Status  Deferred      PT LONG TERM GOAL #2   Title  Patient will demonstrate improvement of 1 grade on MMT strength testing in all deficient muscle groups except hip extension in which patient will be able to maintain full resistance in standing, in order to improve patient's ability to ambulate with improved mechanics.     Baseline  11/23/17: Patient demonstrated improvement some, but not by 1 MMT grade. See MMT.     Time  6    Period  Weeks    Status  On-going      PT LONG TERM GOAL #3   Title  Patient will demonstrate ability to ambulate at a gait velocity of 0.8 m/s with LRAD indicating improved safety with household ambulation.     Baseline  11/23/17: Patient has demonstrated improvement in gait velocity, but is not ambulating at 0.8 m/s.     Time  6    Period  Weeks    Status  On-going      PT LONG TERM GOAL #4   Title  Patient will demonstrate an improvement of 10% on FOTO indicating an improvement in percieved functional mobility.     Time  6    Period  Weeks    Status  Deferred      PT LONG TERM GOAL #5   Title  Patient will demonstrate improvement of 10 seconds on the 5xSTS test indicating improved balance and overall functional mobility.     Time  6    Period  Weeks    Status  Deferred            Plan - 12/04/17 1600    Clinical Impression Statement  continued with established therex, increasing reps where able.  Decreased to 4" step with lunges to increase challenge with cues for form and keeping body in neutral positioning.  continued without pain, however reported increased challenge with lower surface.  Pt with improved SLS time as compared to last session.  Added vector stance to further improve stability in bilateral LE's.  Added hip excursion to improve mobiltiy. Cane adjusted properly for patient and able to display correct sequencing and no antlagia using SPC.     Rehab Potential  Fair    Clinical Impairments Affecting Rehab  Potential  Positive: Motivated; Negative: comorbidities    PT Frequency  3x / week    PT Duration  6 weeks    PT Treatment/Interventions  ADLs/Self Care Home Management;Aquatic Therapy;DME Instruction;Gait training;Stair training;Functional mobility training;Therapeutic activities;Therapeutic exercise;Balance training;Neuromuscular re-education;Patient/family education;Orthotic Fit/Training;Manual techniques;Scar mobilization;Passive range of motion;Dry needling;Energy conservation;Taping    PT Next Visit Plan  Continue to progress functional strengthening and balalnce.  add UE flexion with tandem stance at bars next session to work on stability.  Complete 10th visit progress.     PT Home Exercise Plan  Review exercises patient has been performing at home and upadate as needed at first treatment; 11/13/17 added functional squat, standing hip ext, standing and seated heel/toe raises.        Patient will benefit from skilled therapeutic intervention in order to improve the following deficits and impairments:  Abnormal gait, Decreased balance, Decreased mobility, Difficulty walking, Decreased range of motion, Improper body mechanics, Decreased activity tolerance, Decreased knowledge of use of DME, Decreased strength, Pain  Visit Diagnosis: Muscle weakness (generalized)  Other abnormalities of gait and mobility  Other symptoms and signs involving the musculoskeletal system  Pain in left  hip  Arthritis of left hip  Abnormality of gait and mobility     Problem List Patient Active Problem List   Diagnosis Date Noted  . Primary osteoarthritis of left hip 10/17/2017  . Osteoarthritis of left hip 10/16/2017  . Preop cardiovascular exam 09/10/2017  . Orthopnea 09/10/2017  . Right carotid bruit 10/25/2013  . Diabetes mellitus type 2 in obese ([place])   . Hyperlipidemia with target LDL less than 70   . Orthostatic hypotension 08/28/2013  . Renal insufficiency 08/19/2013  . Obesity (BMI 30-39.9)  05/05/2013  . CAD S/P percutaneous coronary angioplasty -- LAD/OM1 DES 02/17/13 02/18/2013  . Pulmonary embolism August 2011 (syncope) 02/18/2013  . NSTEMI (non-ST elevated myocardial infarction) ([place]) 02/15/2013  . Essential hypertension 02/15/2013   Teena Irani, PTA/CLT [phone]  Teena Irani 12/04/2017, 4:09 PM  [place], [place], [address] Phone: [phone]   Fax:  [phone]  Name: Phyllis Conner MRN: [id number] Date of Birth: [birth year]

## 2017-12-06 ENCOUNTER — Ambulatory Visit (HOSPITAL_COMMUNITY): Payer: Medicare Other | Admitting: Physical Therapy

## 2017-12-06 ENCOUNTER — Telehealth (HOSPITAL_COMMUNITY): Payer: Self-pay | Admitting: Family Medicine

## 2017-12-06 DIAGNOSIS — R2689 Other abnormalities of gait and mobility: Secondary | ICD-10-CM | POA: Diagnosis not present

## 2017-12-06 DIAGNOSIS — R29898 Other symptoms and signs involving the musculoskeletal system: Secondary | ICD-10-CM | POA: Diagnosis not present

## 2017-12-06 DIAGNOSIS — M25552 Pain in left hip: Secondary | ICD-10-CM | POA: Diagnosis not present

## 2017-12-06 DIAGNOSIS — M6281 Muscle weakness (generalized): Secondary | ICD-10-CM | POA: Diagnosis not present

## 2017-12-06 DIAGNOSIS — M1612 Unilateral primary osteoarthritis, left hip: Secondary | ICD-10-CM | POA: Diagnosis not present

## 2017-12-06 DIAGNOSIS — R269 Unspecified abnormalities of gait and mobility: Secondary | ICD-10-CM | POA: Diagnosis not present

## 2017-12-06 NOTE — Telephone Encounter (Signed)
12/06/17  Pt called and wanted to change her appt to 4:00 today

## 2017-12-06 NOTE — Therapy (Signed)
[place], [place], [address] Phone: [phone]   Fax:  [phone]  Physical Therapy Treatment  Patient Details  Name: Phyllis Conner MRN: [id number] Date of Birth: 1946-02-05 Referring Provider: [name], MD   Encounter Date: 12/06/2017  PT End of Session - 12/06/17 1625    Visit Number  10   progress note completed visit #6 on 8/16   Number of Visits  19    Date for PT Re-Evaluation  12/21/17   MInireassess complete 11/23/17   Authorization Type  Primary: UHC Medicare; secondary: Medicaid    Authorization Time Period  11/08/17 - 12/21/17    Authorization - Visit Number  10    Authorization - Number of Visits  19    PT Start Time  5329    PT Stop Time  9242    PT Time Calculation (min)  33 min    Equipment Utilized During Treatment  Gait belt    Activity Tolerance  Patient tolerated treatment well;Patient limited by fatigue;No increased pain    Behavior During Therapy  WFL for tasks assessed/performed       Past Medical History:  Diagnosis Date  . Acid reflux   . Anemia    "when I was younger"  . Aortic stenosis    mild AS 09/14/17 echo  . Arthritis   . CAD S/P percutaneous coronary angioplasty November 2014   Mid LAD 2.5 mm x 12 mm Promus DES (2.70 mm); proximal OM1 - Promus DES 2.75 mm x 16 mm (2.85 mm)  . Diabetes mellitus type 2 in obese ([place])   . Essential hypertension   . Headache   . Hyperlipidemia with target LDL less than 70   . Non-Q wave ST elevation myocardial infarction (STEMI) involving left anterior descending (LAD) coronary artery November 2014   PCI to LAD and circumflex  . Obesity (BMI 30-39.9) 05/05/2013    Past Surgical History:  Procedure Laterality Date  . ABDOMINAL HYSTERECTOMY  1990s  . Arthroscopic knee surgery Bilateral 1984, 2002   Arthroscopic  . CARDIAC CATHETERIZATION  November 2014   Mid LAD 95%, proximal OM1 80-90% --> two-vessel PCI  . COLONOSCOPY N/A 12/05/2013    Procedure: COLONOSCOPY;  Surgeon: Beryle Beams, MD;  Location: [facility];  Service: Endoscopy;  Laterality: N/A;  . COLONOSCOPY WITH PROPOFOL N/A 12/11/2014   Procedure: COLONOSCOPY WITH PROPOFOL;  Surgeon: Carol Ada, MD;  Location: [facility];  Service: Endoscopy;  Laterality: N/A;  . ESOPHAGOGASTRODUODENOSCOPY N/A 12/05/2013   Procedure: ESOPHAGOGASTRODUODENOSCOPY (EGD);  Surgeon: Beryle Beams, MD;  Location: [facility];  Service: Endoscopy;  Laterality: N/A;  . LEFT HEART CATHETERIZATION WITH CORONARY ANGIOGRAM N/A 02/17/2013   Procedure: LEFT HEART CATHETERIZATION WITH CORONARY ANGIOGRAM;  Surgeon: Sanda Klein, MD;  Location: [facility];  Service: Cardiovascular;  Laterality: N/A;  . PERCUTANEOUS CORONARY STENT INTERVENTION (PCI-S)  November 2014   Mid LAD 2.5 mm x 12 mm (2.75 mm) Promus DES; OM1 2.75 mm x 60 mm (2.85 mm) Promus DES  . TOTAL HIP ARTHROPLASTY Left 10/17/2017  . TOTAL HIP ARTHROPLASTY Left 10/17/2017   Procedure: TOTAL HIP ARTHROPLASTY ANTERIOR APPROACH;  Surgeon: [name], MD;  Location: [facility];  Service: Orthopedics;  Laterality: Left;  . TRANSTHORACIC ECHOCARDIOGRAM  02/16/2013   EF 40-50%. Moderate concentric LVH. Moderate H. Shea of mid-distal septal and apical region. Grade 1 diastolic dysfunction. Moderate aortic sclerosis without stenosis.    There were no vitals filed  for this visit.  Subjective Assessment - 12/06/17 1617    Subjective  Pt late for appt.  Reports her pain as 6/10 currently.  comes today with RW.    Currently in Pain?  Yes    Pain Score  6     Pain Location  Knee    Pain Orientation  Right;Left    Pain Descriptors / Indicators  Aching                       [facility] Adult PT Treatment/Exercise - 12/06/17 0001      Ambulation/Gait   Gait Comments  gt with SPC 226 feet      Knee/Hip Exercises: Standing   Heel Raises  20 reps    Heel Raises Limitations  toe raises on incline    Forward Lunges  Both;20 reps     Forward Lunges Limitations  4in step. Holding 2HHA and 1HHA    Side Lunges  Both;15 reps    Side Lunges Limitations  4in step    Functional Squat  1 set;Limitations;15 reps    Functional Squat Limitations  minisquat with BUE A. Max verbal cues and tactile cues for form    SLS  Rt 10", Lt 5" max of 5    SLS with Vectors  both 5X5" each with 1 UE assist    Other Standing Knee Exercises  3D hip excursions 10 reps    Other Standing Knee Exercises  tandem stance 2x 30" on foam with UE flexion using 1# bar 10 reps X 2       Knee/Hip Exercises: Seated   Sit to Sand  10 reps;without UE support               PT Short Term Goals - 11/23/17 1700      PT SHORT TERM GOAL #1   Title  Patient will demonstrate understanding and report regular compliance of HEP in order to improve functional mobility.     Baseline  11/23/17: Patient reported trying to do exercises, but did not do them yesterday.     Time  3    Period  Weeks    Status  On-going      PT SHORT TERM GOAL #2   Title  Patient will demonstrate ability to perform single limb stance for 10 seconds on each lower extremity to improve safety and assist with ambulation and stair navigation.     Baseline  11/23/17: 2 seconds right, 1 second on the left    Time  3    Period  Weeks    Status  On-going      PT SHORT TERM GOAL #3   Title  Patient will demonstrate improvement of 1/2 grade on MMT strength testing in all deficient muscle groups except hip extension in which patient will be able to maintain full resistance in standing, in order to improve patient's ability to ambulate with improved mechanics.     Baseline  11/23/17: See MMT. Improved in some but not all.     Time  3    Period  Weeks    Status  On-going        PT Long Term Goals - 11/23/17 1701      PT LONG TERM GOAL #1   Title  Patient will demonstrate left hip AROM within functional limits to assist with ambulation mechanics and safety.     Time  6    Period  Weeks  Status  Deferred      PT LONG TERM GOAL #2   Title  Patient will demonstrate improvement of 1 grade on MMT strength testing in all deficient muscle groups except hip extension in which patient will be able to maintain full resistance in standing, in order to improve patient's ability to ambulate with improved mechanics.     Baseline  11/23/17: Patient demonstrated improvement some, but not by 1 MMT grade. See MMT.     Time  6    Period  Weeks    Status  On-going      PT LONG TERM GOAL #3   Title  Patient will demonstrate ability to ambulate at a gait velocity of 0.8 m/s with LRAD indicating improved safety with household ambulation.     Baseline  11/23/17: Patient has demonstrated improvement in gait velocity, but is not ambulating at 0.8 m/s.     Time  6    Period  Weeks    Status  On-going      PT LONG TERM GOAL #4   Title  Patient will demonstrate an improvement of 10% on FOTO indicating an improvement in percieved functional mobility.     Time  6    Period  Weeks    Status  Deferred      PT LONG TERM GOAL #5   Title  Patient will demonstrate improvement of 10 seconds on the 5xSTS test indicating improved balance and overall functional mobility.     Time  6    Period  Weeks    Status  Deferred            Plan - 12/06/17 1627    Clinical Impression Statement  Progressed SLS this session, going twice as long with noted improved stability in bilateral LE's.  Contnued to work on improving comfort with use of SPC outside of therapy.  Began tandem stance with UE flexion to increase stablity.  PT requires cues to decrease reliance on UE's with actvities.      Rehab Potential  Fair    Clinical Impairments Affecting Rehab Potential  Positive: Motivated; Negative: comorbidities    PT Frequency  3x / week    PT Duration  6 weeks    PT Treatment/Interventions  ADLs/Self Care Home Management;Aquatic Therapy;DME Instruction;Gait training;Stair training;Functional mobility  training;Therapeutic activities;Therapeutic exercise;Balance training;Neuromuscular re-education;Patient/family education;Orthotic Fit/Training;Manual techniques;Scar mobilization;Passive range of motion;Dry needling;Energy conservation;Taping    PT Next Visit Plan  Continue to progress functional strengthening and balance.  Begin side stepping.      PT Home Exercise Plan  Review exercises patient has been performing at home and upadate as needed at first treatment; 11/13/17 added functional squat, standing hip ext, standing and seated heel/toe raises.        Patient will benefit from skilled therapeutic intervention in order to improve the following deficits and impairments:  Abnormal gait, Decreased balance, Decreased mobility, Difficulty walking, Decreased range of motion, Improper body mechanics, Decreased activity tolerance, Decreased knowledge of use of DME, Decreased strength, Pain  Visit Diagnosis: Muscle weakness (generalized)  Other abnormalities of gait and mobility  Other symptoms and signs involving the musculoskeletal system     Problem List Patient Active Problem List   Diagnosis Date Noted  . Primary osteoarthritis of left hip 10/17/2017  . Osteoarthritis of left hip 10/16/2017  . Preop cardiovascular exam 09/10/2017  . Orthopnea 09/10/2017  . Right carotid bruit 10/25/2013  . Diabetes mellitus type 2 in obese ([place])   .  Hyperlipidemia with target LDL less than 70   . Orthostatic hypotension 08/28/2013  . Renal insufficiency 08/19/2013  . Obesity (BMI 30-39.9) 05/05/2013  . CAD S/P percutaneous coronary angioplasty -- LAD/OM1 DES 02/17/13 02/18/2013  . Pulmonary embolism August 2011 (syncope) 02/18/2013  . NSTEMI (non-ST elevated myocardial infarction) ([place]) 02/15/2013  . Essential hypertension 02/15/2013   Teena Irani, PTA/CLT [phone]  Teena Irani 12/06/2017, 4:42 PM  [place], [place], [address] Phone: [phone]   Fax:  [phone]  Name: KARISMA MEISER MRN: [id number] Date of Birth: 1945/06/12

## 2017-12-07 ENCOUNTER — Encounter (HOSPITAL_COMMUNITY): Payer: Self-pay

## 2017-12-07 ENCOUNTER — Ambulatory Visit (HOSPITAL_COMMUNITY): Payer: Medicare Other

## 2017-12-07 DIAGNOSIS — R29898 Other symptoms and signs involving the musculoskeletal system: Secondary | ICD-10-CM

## 2017-12-07 DIAGNOSIS — M6281 Muscle weakness (generalized): Secondary | ICD-10-CM | POA: Diagnosis not present

## 2017-12-07 DIAGNOSIS — R269 Unspecified abnormalities of gait and mobility: Secondary | ICD-10-CM | POA: Diagnosis not present

## 2017-12-07 DIAGNOSIS — M25552 Pain in left hip: Secondary | ICD-10-CM

## 2017-12-07 DIAGNOSIS — M1612 Unilateral primary osteoarthritis, left hip: Secondary | ICD-10-CM | POA: Diagnosis not present

## 2017-12-07 DIAGNOSIS — R2689 Other abnormalities of gait and mobility: Secondary | ICD-10-CM

## 2017-12-07 NOTE — Therapy (Signed)
[place], [place], [address] Phone: [phone]   Fax:  [phone]  Physical Therapy Treatment  Patient Details  Name: Phyllis Conner MRN: [id number] Date of Birth: 1946-03-07 Referring Provider: [name], MD   Encounter Date: 12/07/2017  PT End of Session - 12/07/17 1452    Visit Number  11   reassessment complete visit #6 on 11/23/17   Number of Visits  19    Date for PT Re-Evaluation  12/21/17   MInireassess complete 11/23/17   Authorization Type  Primary: UHC Medicare; secondary: Medicaid    Authorization Time Period  11/08/17 - 12/21/17    Authorization - Visit Number  11    Authorization - Number of Visits  19    PT Start Time  2426   pt late for dept   PT Stop Time  1514    PT Time Calculation (min)  31 min    Equipment Utilized During Treatment  Gait belt    Activity Tolerance  Patient tolerated treatment well;Patient limited by fatigue;No increased pain    Behavior During Therapy  WFL for tasks assessed/performed       Past Medical History:  Diagnosis Date  . Acid reflux   . Anemia    "when I was younger"  . Aortic stenosis    mild AS 09/14/17 echo  . Arthritis   . CAD S/P percutaneous coronary angioplasty November 2014   Mid LAD 2.5 mm x 12 mm Promus DES (2.70 mm); proximal OM1 - Promus DES 2.75 mm x 16 mm (2.85 mm)  . Diabetes mellitus type 2 in obese ([place])   . Essential hypertension   . Headache   . Hyperlipidemia with target LDL less than 70   . Non-Q wave ST elevation myocardial infarction (STEMI) involving left anterior descending (LAD) coronary artery November 2014   PCI to LAD and circumflex  . Obesity (BMI 30-39.9) 05/05/2013    Past Surgical History:  Procedure Laterality Date  . ABDOMINAL HYSTERECTOMY  1990s  . Arthroscopic knee surgery Bilateral 1984, 2002   Arthroscopic  . CARDIAC CATHETERIZATION  November 2014   Mid LAD 95%, proximal OM1 80-90% --> two-vessel PCI  .  COLONOSCOPY N/A 12/05/2013   Procedure: COLONOSCOPY;  Surgeon: Beryle Beams, MD;  Location: [facility];  Service: Endoscopy;  Laterality: N/A;  . COLONOSCOPY WITH PROPOFOL N/A 12/11/2014   Procedure: COLONOSCOPY WITH PROPOFOL;  Surgeon: Carol Ada, MD;  Location: [facility];  Service: Endoscopy;  Laterality: N/A;  . ESOPHAGOGASTRODUODENOSCOPY N/A 12/05/2013   Procedure: ESOPHAGOGASTRODUODENOSCOPY (EGD);  Surgeon: Beryle Beams, MD;  Location: [facility];  Service: Endoscopy;  Laterality: N/A;  . LEFT HEART CATHETERIZATION WITH CORONARY ANGIOGRAM N/A 02/17/2013   Procedure: LEFT HEART CATHETERIZATION WITH CORONARY ANGIOGRAM;  Surgeon: Sanda Klein, MD;  Location: [facility];  Service: Cardiovascular;  Laterality: N/A;  . PERCUTANEOUS CORONARY STENT INTERVENTION (PCI-S)  November 2014   Mid LAD 2.5 mm x 12 mm (2.75 mm) Promus DES; OM1 2.75 mm x 60 mm (2.85 mm) Promus DES  . TOTAL HIP ARTHROPLASTY Left 10/17/2017  . TOTAL HIP ARTHROPLASTY Left 10/17/2017   Procedure: TOTAL HIP ARTHROPLASTY ANTERIOR APPROACH;  Surgeon: [name], MD;  Location: [facility];  Service: Orthopedics;  Laterality: Left;  . TRANSTHORACIC ECHOCARDIOGRAM  02/16/2013   EF 40-50%. Moderate concentric LVH. Moderate H. Shea of mid-distal septal and apical region. Grade 1 diastolic dysfunction. Moderate aortic sclerosis without stenosis.    There  were no vitals filed for this visit.  Subjective Assessment - 12/07/17 1450    Subjective  Pt late for apt today.  Reports she has pain Rt knee and Lt hip today    Pertinent History  History of arthroscopic knee surgery; Left total hip replacement 10/17/17    Patient Stated Goals  To walk normal     Currently in Pain?  Yes    Pain Score  6     Pain Location  Knee   Rt knee 6/10, Lt hip 2/10   Pain Orientation  Right    Pain Descriptors / Indicators  Aching    Pain Type  Chronic pain                       [facility] Adult PT Treatment/Exercise - 12/07/17 0001       Knee/Hip Exercises: Standing   Heel Raises  20 reps    Heel Raises Limitations  toe raises on incline    Forward Lunges  Both;20 reps    Forward Lunges Limitations  4in step. Holding 2HHA and 1HHA    Side Lunges  Both;15 reps    Side Lunges Limitations  4in step    Functional Squat  1 set;Limitations;15 reps    Functional Squat Limitations  minisquat with BUE A. Max verbal cues and tactile cues for form    SLS  Time- resume next session    SLS with Vectors  Time- resume next session    Other Standing Knee Exercises  3D hip excursions 10 reps    Other Standing Knee Exercises  sidestep 2RT with RTB       Knee/Hip Exercises: Seated   Sit to Sand  10 reps;without UE support   eccentric control              PT Short Term Goals - 11/23/17 1700      PT SHORT TERM GOAL #1   Title  Patient will demonstrate understanding and report regular compliance of HEP in order to improve functional mobility.     Baseline  11/23/17: Patient reported trying to do exercises, but did not do them yesterday.     Time  3    Period  Weeks    Status  On-going      PT SHORT TERM GOAL #2   Title  Patient will demonstrate ability to perform single limb stance for 10 seconds on each lower extremity to improve safety and assist with ambulation and stair navigation.     Baseline  11/23/17: 2 seconds right, 1 second on the left    Time  3    Period  Weeks    Status  On-going      PT SHORT TERM GOAL #3   Title  Patient will demonstrate improvement of 1/2 grade on MMT strength testing in all deficient muscle groups except hip extension in which patient will be able to maintain full resistance in standing, in order to improve patient's ability to ambulate with improved mechanics.     Baseline  11/23/17: See MMT. Improved in some but not all.     Time  3    Period  Weeks    Status  On-going        PT Long Term Goals - 11/23/17 1701      PT LONG TERM GOAL #1   Title  Patient will demonstrate left  hip AROM within functional limits to assist with ambulation mechanics and  safety.     Time  6    Period  Weeks    Status  Deferred      PT LONG TERM GOAL #2   Title  Patient will demonstrate improvement of 1 grade on MMT strength testing in all deficient muscle groups except hip extension in which patient will be able to maintain full resistance in standing, in order to improve patient's ability to ambulate with improved mechanics.     Baseline  11/23/17: Patient demonstrated improvement some, but not by 1 MMT grade. See MMT.     Time  6    Period  Weeks    Status  On-going      PT LONG TERM GOAL #3   Title  Patient will demonstrate ability to ambulate at a gait velocity of 0.8 m/s with LRAD indicating improved safety with household ambulation.     Baseline  11/23/17: Patient has demonstrated improvement in gait velocity, but is not ambulating at 0.8 m/s.     Time  6    Period  Weeks    Status  On-going      PT LONG TERM GOAL #4   Title  Patient will demonstrate an improvement of 10% on FOTO indicating an improvement in percieved functional mobility.     Time  6    Period  Weeks    Status  Deferred      PT LONG TERM GOAL #5   Title  Patient will demonstrate improvement of 10 seconds on the 5xSTS test indicating improved balance and overall functional mobility.     Time  6    Period  Weeks    Status  Deferred            Plan - 12/07/17 1510    Clinical Impression Statement  Session focus iwht hip stability and functional strengthening.  Use of SPC for ambulation through session, encouraged pt to utilize more inside home for comfort.  Pt continues to require cueing for mechanics with squats due to gluteal weakness.  Reports of pain reduced at EOS.  Pt was limited by fatigue with activities.      Rehab Potential  Fair    Clinical Impairments Affecting Rehab Potential  Positive: Motivated; Negative: comorbidities    PT Frequency  3x / week    PT Duration  6 weeks    PT  Treatment/Interventions  ADLs/Self Care Home Management;Aquatic Therapy;DME Instruction;Gait training;Stair training;Functional mobility training;Therapeutic activities;Therapeutic exercise;Balance training;Neuromuscular re-education;Patient/family education;Orthotic Fit/Training;Manual techniques;Scar mobilization;Passive range of motion;Dry needling;Energy conservation;Taping    PT Next Visit Plan  Continue to progress functional strengthening and balance.  Add wall slides next session    PT Home Exercise Plan  Review exercises patient has been performing at home and upadate as needed at first treatment; 11/13/17 added functional squat, standing hip ext, standing and seated heel/toe raises.        Patient will benefit from skilled therapeutic intervention in order to improve the following deficits and impairments:  Abnormal gait, Decreased balance, Decreased mobility, Difficulty walking, Decreased range of motion, Improper body mechanics, Decreased activity tolerance, Decreased knowledge of use of DME, Decreased strength, Pain  Visit Diagnosis: Muscle weakness (generalized)  Other abnormalities of gait and mobility  Other symptoms and signs involving the musculoskeletal system  Pain in left hip     Problem List Patient Active Problem List   Diagnosis Date Noted  . Primary osteoarthritis of left hip 10/17/2017  . Osteoarthritis of left hip 10/16/2017  .  Preop cardiovascular exam 09/10/2017  . Orthopnea 09/10/2017  . Right carotid bruit 10/25/2013  . Diabetes mellitus type 2 in obese ([place])   . Hyperlipidemia with target LDL less than 70   . Orthostatic hypotension 08/28/2013  . Renal insufficiency 08/19/2013  . Obesity (BMI 30-39.9) 05/05/2013  . CAD S/P percutaneous coronary angioplasty -- LAD/OM1 DES 02/17/13 02/18/2013  . Pulmonary embolism August 2011 (syncope) 02/18/2013  . NSTEMI (non-ST elevated myocardial infarction) ([place]) 02/15/2013  . Essential hypertension 02/15/2013    Ihor Austin, [place]; [place]  Aldona Lento 12/07/2017, 3:39 PM  [facility] [address] [place], [place], [address] Phone: [phone]   Fax:  [phone]  Name: Phyllis Conner MRN: [id number] Date of Birth: 06-Nov-1945

## 2017-12-11 ENCOUNTER — Encounter (HOSPITAL_COMMUNITY): Payer: Self-pay | Admitting: Physical Therapy

## 2017-12-11 ENCOUNTER — Ambulatory Visit (HOSPITAL_COMMUNITY): Payer: Medicare Other | Admitting: Physical Therapy

## 2017-12-11 ENCOUNTER — Ambulatory Visit (HOSPITAL_COMMUNITY): Payer: Medicare Other | Attending: Orthopedic Surgery | Admitting: Physical Therapy

## 2017-12-11 DIAGNOSIS — M1612 Unilateral primary osteoarthritis, left hip: Secondary | ICD-10-CM | POA: Diagnosis not present

## 2017-12-11 DIAGNOSIS — R269 Unspecified abnormalities of gait and mobility: Secondary | ICD-10-CM | POA: Diagnosis not present

## 2017-12-11 DIAGNOSIS — R29898 Other symptoms and signs involving the musculoskeletal system: Secondary | ICD-10-CM | POA: Diagnosis not present

## 2017-12-11 DIAGNOSIS — M6281 Muscle weakness (generalized): Secondary | ICD-10-CM | POA: Diagnosis not present

## 2017-12-11 DIAGNOSIS — M25552 Pain in left hip: Secondary | ICD-10-CM

## 2017-12-11 DIAGNOSIS — R2689 Other abnormalities of gait and mobility: Secondary | ICD-10-CM | POA: Insufficient documentation

## 2017-12-11 NOTE — Therapy (Signed)
[place], [place], [address] Phone: [phone]   Fax:  [phone]  Physical Therapy Treatment  Patient Details  Name: Phyllis Conner MRN: [id number] Date of Birth: May 13, 1945 Referring Provider: [name], MD   Encounter Date: 12/11/2017  PT End of Session - 12/11/17 1400    Visit Number  12   reassessment complete visit #6 on 11/23/17   Number of Visits  19    Date for PT Re-Evaluation  12/21/17   MInireassess complete 11/23/17   Authorization Type  Primary: UHC Medicare; secondary: Medicaid    Authorization Time Period  11/08/17 - 12/21/17    Authorization - Visit Number  12    Authorization - Number of Visits  19    PT Start Time  0354   Patient arrived late   PT Stop Time  1430    PT Time Calculation (min)  38 min    Equipment Utilized During Treatment  Gait belt    Activity Tolerance  Patient tolerated treatment well;Patient limited by fatigue;No increased pain    Behavior During Therapy  WFL for tasks assessed/performed       Past Medical History:  Diagnosis Date  . Acid reflux   . Anemia    "when I was younger"  . Aortic stenosis    mild AS 09/14/17 echo  . Arthritis   . CAD S/P percutaneous coronary angioplasty November 2014   Mid LAD 2.5 mm x 12 mm Promus DES (2.70 mm); proximal OM1 - Promus DES 2.75 mm x 16 mm (2.85 mm)  . Diabetes mellitus type 2 in obese ([place])   . Essential hypertension   . Headache   . Hyperlipidemia with target LDL less than 70   . Non-Q wave ST elevation myocardial infarction (STEMI) involving left anterior descending (LAD) coronary artery November 2014   PCI to LAD and circumflex  . Obesity (BMI 30-39.9) 05/05/2013    Past Surgical History:  Procedure Laterality Date  . ABDOMINAL HYSTERECTOMY  1990s  . Arthroscopic knee surgery Bilateral 1984, 2002   Arthroscopic  . CARDIAC CATHETERIZATION  November 2014   Mid LAD 95%, proximal OM1 80-90% --> two-vessel PCI  .  COLONOSCOPY N/A 12/05/2013   Procedure: COLONOSCOPY;  Surgeon: Beryle Beams, MD;  Location: [facility];  Service: Endoscopy;  Laterality: N/A;  . COLONOSCOPY WITH PROPOFOL N/A 12/11/2014   Procedure: COLONOSCOPY WITH PROPOFOL;  Surgeon: Carol Ada, MD;  Location: [facility];  Service: Endoscopy;  Laterality: N/A;  . ESOPHAGOGASTRODUODENOSCOPY N/A 12/05/2013   Procedure: ESOPHAGOGASTRODUODENOSCOPY (EGD);  Surgeon: Beryle Beams, MD;  Location: [facility];  Service: Endoscopy;  Laterality: N/A;  . LEFT HEART CATHETERIZATION WITH CORONARY ANGIOGRAM N/A 02/17/2013   Procedure: LEFT HEART CATHETERIZATION WITH CORONARY ANGIOGRAM;  Surgeon: Sanda Klein, MD;  Location: [facility];  Service: Cardiovascular;  Laterality: N/A;  . PERCUTANEOUS CORONARY STENT INTERVENTION (PCI-S)  November 2014   Mid LAD 2.5 mm x 12 mm (2.75 mm) Promus DES; OM1 2.75 mm x 60 mm (2.85 mm) Promus DES  . TOTAL HIP ARTHROPLASTY Left 10/17/2017  . TOTAL HIP ARTHROPLASTY Left 10/17/2017   Procedure: TOTAL HIP ARTHROPLASTY ANTERIOR APPROACH;  Surgeon: [name], MD;  Location: [facility];  Service: Orthopedics;  Laterality: Left;  . TRANSTHORACIC ECHOCARDIOGRAM  02/16/2013   EF 40-50%. Moderate concentric LVH. Moderate H. Shea of mid-distal septal and apical region. Grade 1 diastolic dysfunction. Moderate aortic sclerosis without stenosis.    There were  no vitals filed for this visit.  Subjective Assessment - 12/11/17 1356    Subjective  Patient arrived late for appointment today. Patient stated that she is having right knee pain today.     Pertinent History  History of arthroscopic knee surgery; Left total hip replacement 10/17/17    Patient Stated Goals  To walk normal     Currently in Pain?  Yes    Pain Score  5     Pain Location  Knee    Pain Orientation  Right    Pain Descriptors / Indicators  Aching                       [facility] Adult PT Treatment/Exercise - 12/11/17 0001      Knee/Hip  Exercises: Standing   Heel Raises  20 reps    Heel Raises Limitations  toe raises on incline    Forward Lunges  Both;20 reps    Forward Lunges Limitations  4in step. Holding 2HHA and 1HHA    Side Lunges  Both;15 reps    Side Lunges Limitations  4in step    Functional Squat  1 set;Limitations;15 reps    Functional Squat Limitations  minisquat with BUE A. Max verbal cues and tactile cues for form    SLS  5x each lower extremity 6'' hold max on right max of 3 seconds on the right    SLS with Vectors  5x5'' holds each LE. Single HHA    Other Standing Knee Exercises  sidestep 14 feet x 2 roundtrips with RTB       Knee/Hip Exercises: Seated   Other Seated Knee/Hip Exercises  Seated marching over 6'' hurdle with left lower extremity x10 repetitions VCs for improved form    Sit to Sand  10 reps;without UE support               PT Short Term Goals - 11/23/17 1700      PT SHORT TERM GOAL #1   Title  Patient will demonstrate understanding and report regular compliance of HEP in order to improve functional mobility.     Baseline  11/23/17: Patient reported trying to do exercises, but did not do them yesterday.     Time  3    Period  Weeks    Status  On-going      PT SHORT TERM GOAL #2   Title  Patient will demonstrate ability to perform single limb stance for 10 seconds on each lower extremity to improve safety and assist with ambulation and stair navigation.     Baseline  11/23/17: 2 seconds right, 1 second on the left    Time  3    Period  Weeks    Status  On-going      PT SHORT TERM GOAL #3   Title  Patient will demonstrate improvement of 1/2 grade on MMT strength testing in all deficient muscle groups except hip extension in which patient will be able to maintain full resistance in standing, in order to improve patient's ability to ambulate with improved mechanics.     Baseline  11/23/17: See MMT. Improved in some but not all.     Time  3    Period  Weeks    Status  On-going         PT Long Term Goals - 11/23/17 1701      PT LONG TERM GOAL #1   Title  Patient will demonstrate left hip  AROM within functional limits to assist with ambulation mechanics and safety.     Time  6    Period  Weeks    Status  Deferred      PT LONG TERM GOAL #2   Title  Patient will demonstrate improvement of 1 grade on MMT strength testing in all deficient muscle groups except hip extension in which patient will be able to maintain full resistance in standing, in order to improve patient's ability to ambulate with improved mechanics.     Baseline  11/23/17: Patient demonstrated improvement some, but not by 1 MMT grade. See MMT.     Time  6    Period  Weeks    Status  On-going      PT LONG TERM GOAL #3   Title  Patient will demonstrate ability to ambulate at a gait velocity of 0.8 m/s with LRAD indicating improved safety with household ambulation.     Baseline  11/23/17: Patient has demonstrated improvement in gait velocity, but is not ambulating at 0.8 m/s.     Time  6    Period  Weeks    Status  On-going      PT LONG TERM GOAL #4   Title  Patient will demonstrate an improvement of 10% on FOTO indicating an improvement in percieved functional mobility.     Time  6    Period  Weeks    Status  Deferred      PT LONG TERM GOAL #5   Title  Patient will demonstrate improvement of 10 seconds on the 5xSTS test indicating improved balance and overall functional mobility.     Time  6    Period  Weeks    Status  Deferred            Plan - 12/11/17 1434    Clinical Impression Statement  This session continued with a focus on hip strengthening, mobility, and balance. This session added seated marching over 6'' hurdles to improve patient's hip mobility and strength. Patient demonstrated contact with the hurdle on several of the attempts. Plan to continue with progression of functional strengthening, balance, and hip mobility in upcoming sessions.     Rehab Potential  Fair    Clinical  Impairments Affecting Rehab Potential  Positive: Motivated; Negative: comorbidities    PT Frequency  3x / week    PT Duration  6 weeks    PT Treatment/Interventions  ADLs/Self Care Home Management;Aquatic Therapy;DME Instruction;Gait training;Stair training;Functional mobility training;Therapeutic activities;Therapeutic exercise;Balance training;Neuromuscular re-education;Patient/family education;Orthotic Fit/Training;Manual techniques;Scar mobilization;Passive range of motion;Dry needling;Energy conservation;Taping    PT Next Visit Plan  Continue to progress functional strengthening and balance.  Add wall slides next session    PT Home Exercise Plan  Review exercises patient has been performing at home and upadate as needed at first treatment; 11/13/17 added functional squat, standing hip ext, standing and seated heel/toe raises.        Patient will benefit from skilled therapeutic intervention in order to improve the following deficits and impairments:  Abnormal gait, Decreased balance, Decreased mobility, Difficulty walking, Decreased range of motion, Improper body mechanics, Decreased activity tolerance, Decreased knowledge of use of DME, Decreased strength, Pain  Visit Diagnosis: Muscle weakness (generalized)  Other abnormalities of gait and mobility  Other symptoms and signs involving the musculoskeletal system  Pain in left hip     Problem List Patient Active Problem List   Diagnosis Date Noted  . Primary osteoarthritis of left hip 10/17/2017  .  Osteoarthritis of left hip 10/16/2017  . Preop cardiovascular exam 09/10/2017  . Orthopnea 09/10/2017  . Right carotid bruit 10/25/2013  . Diabetes mellitus type 2 in obese ([place])   . Hyperlipidemia with target LDL less than 70   . Orthostatic hypotension 08/28/2013  . Renal insufficiency 08/19/2013  . Obesity (BMI 30-39.9) 05/05/2013  . CAD S/P percutaneous coronary angioplasty -- LAD/OM1 DES 02/17/13 02/18/2013  . Pulmonary embolism  August 2011 (syncope) 02/18/2013  . NSTEMI (non-ST elevated myocardial infarction) ([facility] [place]) 02/15/2013  . Essential hypertension 02/15/2013   Clarene Critchley PT, DPT 2:36 PM, 12/11/17 [facility] [address] [place], [place], [address] Phone: [phone]   Fax:  [phone]  Name: Phyllis Conner MRN: [id number] Date of Birth: 30-Sep-1945

## 2017-12-13 ENCOUNTER — Telehealth (HOSPITAL_COMMUNITY): Payer: Self-pay | Admitting: Family Medicine

## 2017-12-13 ENCOUNTER — Ambulatory Visit (HOSPITAL_COMMUNITY): Payer: Medicare Other | Admitting: Physical Therapy

## 2017-12-13 DIAGNOSIS — M25552 Pain in left hip: Secondary | ICD-10-CM

## 2017-12-13 DIAGNOSIS — R269 Unspecified abnormalities of gait and mobility: Secondary | ICD-10-CM | POA: Diagnosis not present

## 2017-12-13 DIAGNOSIS — R29898 Other symptoms and signs involving the musculoskeletal system: Secondary | ICD-10-CM | POA: Diagnosis not present

## 2017-12-13 DIAGNOSIS — M6281 Muscle weakness (generalized): Secondary | ICD-10-CM

## 2017-12-13 DIAGNOSIS — R2689 Other abnormalities of gait and mobility: Secondary | ICD-10-CM | POA: Diagnosis not present

## 2017-12-13 DIAGNOSIS — M1612 Unilateral primary osteoarthritis, left hip: Secondary | ICD-10-CM | POA: Diagnosis not present

## 2017-12-13 NOTE — Telephone Encounter (Signed)
12/13/17  Patient called and said that for her 1:45 appt her ride didn't come so she wanted to come in later and I moved her to 3:15

## 2017-12-13 NOTE — Therapy (Signed)
[facility] [place], [place], [address] Phone: [phone]   Fax:  [phone]  Physical Therapy Treatment  Patient Details  Name: Phyllis Conner MRN: [id number] Date of Birth: [birth year] Referring Provider: [name], MD   Encounter Date: 12/13/2017  PT End of Session - 12/13/17 1638    Visit Number  13   reassessment complete visit #6 on 11/23/17   Number of Visits  19    Date for PT Re-Evaluation  12/21/17   MInireassess complete 11/23/17   Authorization Type  Primary: UHC Medicare; secondary: Medicaid    Authorization Time Period  11/08/17 - 12/21/17    Authorization - Visit Number  13    Authorization - Number of Visits  19    PT Start Time  1526    PT Stop Time  1605    PT Time Calculation (min)  39 min    Equipment Utilized During Treatment  Gait belt    Activity Tolerance  Patient tolerated treatment well;Patient limited by fatigue;No increased pain    Behavior During Therapy  WFL for tasks assessed/performed       Past Medical History:  Diagnosis Date  . Acid reflux   . Anemia    "when I was younger"  . Aortic stenosis    mild AS 09/14/17 echo  . Arthritis   . CAD S/P percutaneous coronary angioplasty November 2014   Mid LAD 2.5 mm x 12 mm Promus DES (2.70 mm); proximal OM1 - Promus DES 2.75 mm x 16 mm (2.85 mm)  . Diabetes mellitus type 2 in obese ([place])   . Essential hypertension   . Headache   . Hyperlipidemia with target LDL less than 70   . Non-Q wave ST elevation myocardial infarction (STEMI) involving left anterior descending (LAD) coronary artery November 2014   PCI to LAD and circumflex  . Obesity (BMI 30-39.9) 05/05/2013    Past Surgical History:  Procedure Laterality Date  . ABDOMINAL HYSTERECTOMY  1990s  . Arthroscopic knee surgery Bilateral 1984, 2002   Arthroscopic  . CARDIAC CATHETERIZATION  November 2014   Mid LAD 95%, proximal OM1 80-90% --> two-vessel PCI  . COLONOSCOPY N/A 12/05/2013    Procedure: COLONOSCOPY;  Surgeon: Beryle Beams, MD;  Location: [facility];  Service: Endoscopy;  Laterality: N/A;  . COLONOSCOPY WITH PROPOFOL N/A 12/11/2014   Procedure: COLONOSCOPY WITH PROPOFOL;  Surgeon: Carol Ada, MD;  Location: [facility];  Service: Endoscopy;  Laterality: N/A;  . ESOPHAGOGASTRODUODENOSCOPY N/A 12/05/2013   Procedure: ESOPHAGOGASTRODUODENOSCOPY (EGD);  Surgeon: Beryle Beams, MD;  Location: [facility];  Service: Endoscopy;  Laterality: N/A;  . LEFT HEART CATHETERIZATION WITH CORONARY ANGIOGRAM N/A 02/17/2013   Procedure: LEFT HEART CATHETERIZATION WITH CORONARY ANGIOGRAM;  Surgeon: Sanda Klein, MD;  Location: [facility];  Service: Cardiovascular;  Laterality: N/A;  . PERCUTANEOUS CORONARY STENT INTERVENTION (PCI-S)  November 2014   Mid LAD 2.5 mm x 12 mm (2.75 mm) Promus DES; OM1 2.75 mm x 60 mm (2.85 mm) Promus DES  . TOTAL HIP ARTHROPLASTY Left 10/17/2017  . TOTAL HIP ARTHROPLASTY Left 10/17/2017   Procedure: TOTAL HIP ARTHROPLASTY ANTERIOR APPROACH;  Surgeon: [name], MD;  Location: [facility];  Service: Orthopedics;  Laterality: Left;  . TRANSTHORACIC ECHOCARDIOGRAM  02/16/2013   EF 40-50%. Moderate concentric LVH. Moderate H. Shea of mid-distal septal and apical region. Grade 1 diastolic dysfunction. Moderate aortic sclerosis without stenosis.    There were no vitals filed for  this visit.  Subjective Assessment - 12/13/17 1532    Subjective  Pt states she was having 7/10 pain in her incision area but has stopped.  currently having pain in her Rt knee.  Pt resecheduled her earlier scheduled appointment from today after scheduled time.     Currently in Pain?  Yes    Pain Score  5     Pain Location  Knee    Pain Orientation  Right    Pain Descriptors / Indicators  Aching;Burning    Pain Type  Chronic pain                       [facility] Adult PT Treatment/Exercise - 12/13/17 0001      Knee/Hip Exercises: Standing   Heel Raises  20  reps    Heel Raises Limitations  toe raises on incline    Forward Lunges  Both;20 reps    Forward Lunges Limitations  4in step. Holding 2HHA and 1HHA    Side Lunges  Both;15 reps    Side Lunges Limitations  4in step    Forward Step Up  Right;Left;Step Height: 6";Step Height: 4";10 reps;Limitations    Forward Step Up Limitations  6" Lt, 4" Rt    Functional Squat  1 set;Limitations;15 reps    Functional Squat Limitations  minisquat with BUE A. Max verbal cues and tactile cues for form    Wall Squat  10 reps    Wall Squat Limitations  3" holds    SLS with Vectors  10x5'' holds each LE. Single HHA    Other Standing Knee Exercises  --      Knee/Hip Exercises: Seated   Other Seated Knee/Hip Exercises  seated hip flexion over 6'' hurdle with left lower extremity x10 repetitions VCs for improved form    Sit to Sand  10 reps;without UE support      Knee/Hip Exercises: Supine   Quad Sets  Strengthening;Left;1 set;10 reps               PT Short Term Goals - 11/23/17 1700      PT SHORT TERM GOAL #1   Title  Patient will demonstrate understanding and report regular compliance of HEP in order to improve functional mobility.     Baseline  11/23/17: Patient reported trying to do exercises, but did not do them yesterday.     Time  3    Period  Weeks    Status  On-going      PT SHORT TERM GOAL #2   Title  Patient will demonstrate ability to perform single limb stance for 10 seconds on each lower extremity to improve safety and assist with ambulation and stair navigation.     Baseline  11/23/17: 2 seconds right, 1 second on the left    Time  3    Period  Weeks    Status  On-going      PT SHORT TERM GOAL #3   Title  Patient will demonstrate improvement of 1/2 grade on MMT strength testing in all deficient muscle groups except hip extension in which patient will be able to maintain full resistance in standing, in order to improve patient's ability to ambulate with improved mechanics.      Baseline  11/23/17: See MMT. Improved in some but not all.     Time  3    Period  Weeks    Status  On-going        PT  Long Term Goals - 11/23/17 1701      PT LONG TERM GOAL #1   Title  Patient will demonstrate left hip AROM within functional limits to assist with ambulation mechanics and safety.     Time  6    Period  Weeks    Status  Deferred      PT LONG TERM GOAL #2   Title  Patient will demonstrate improvement of 1 grade on MMT strength testing in all deficient muscle groups except hip extension in which patient will be able to maintain full resistance in standing, in order to improve patient's ability to ambulate with improved mechanics.     Baseline  11/23/17: Patient demonstrated improvement some, but not by 1 MMT grade. See MMT.     Time  6    Period  Weeks    Status  On-going      PT LONG TERM GOAL #3   Title  Patient will demonstrate ability to ambulate at a gait velocity of 0.8 m/s with LRAD indicating improved safety with household ambulation.     Baseline  11/23/17: Patient has demonstrated improvement in gait velocity, but is not ambulating at 0.8 m/s.     Time  6    Period  Weeks    Status  On-going      PT LONG TERM GOAL #4   Title  Patient will demonstrate an improvement of 10% on FOTO indicating an improvement in percieved functional mobility.     Time  6    Period  Weeks    Status  Deferred      PT LONG TERM GOAL #5   Title  Patient will demonstrate improvement of 10 seconds on the 5xSTS test indicating improved balance and overall functional mobility.     Time  6    Period  Weeks    Status  Deferred            Plan - 12/13/17 9675    Clinical Impression Statement  continued exercise progression working on step ups this session and adding wall slides to routine.  Pt with difficulty steppping up onto 6" step with Rt and unalble to bend his knee when lowering Lt LE back down.  Decreased height to 4" and continued to have diffiuclty without tactile cues.    Minimal depth achieved with wallslides, however form improved following instruction.  PT with noted fatigue at end of session.  No change in pain .    Rehab Potential  Fair    Clinical Impairments Affecting Rehab Potential  Positive: Motivated; Negative: comorbidities    PT Frequency  3x / week    PT Duration  6 weeks    PT Treatment/Interventions  ADLs/Self Care Home Management;Aquatic Therapy;DME Instruction;Gait training;Stair training;Functional mobility training;Therapeutic activities;Therapeutic exercise;Balance training;Neuromuscular re-education;Patient/family education;Orthotic Fit/Training;Manual techniques;Scar mobilization;Passive range of motion;Dry needling;Energy conservation;Taping    PT Next Visit Plan  Continue to progress functional strengthening and balance.      PT Home Exercise Plan  Review exercises patient has been performing at home and upadate as needed at first treatment; 11/13/17 added functional squat, standing hip ext, standing and seated heel/toe raises.        Patient will benefit from skilled therapeutic intervention in order to improve the following deficits and impairments:  Abnormal gait, Decreased balance, Decreased mobility, Difficulty walking, Decreased range of motion, Improper body mechanics, Decreased activity tolerance, Decreased knowledge of use of DME, Decreased strength, Pain  Visit Diagnosis: Muscle weakness (generalized)  Other abnormalities of gait and mobility  Other symptoms and signs involving the musculoskeletal system  Pain in left hip  Arthritis of left hip     Problem List Patient Active Problem List   Diagnosis Date Noted  . Primary osteoarthritis of left hip 10/17/2017  . Osteoarthritis of left hip 10/16/2017  . Preop cardiovascular exam 09/10/2017  . Orthopnea 09/10/2017  . Right carotid bruit 10/25/2013  . Diabetes mellitus type 2 in obese ([place])   . Hyperlipidemia with target LDL less than 70   . Orthostatic hypotension  08/28/2013  . Renal insufficiency 08/19/2013  . Obesity (BMI 30-39.9) 05/05/2013  . CAD S/P percutaneous coronary angioplasty -- LAD/OM1 DES 02/17/13 02/18/2013  . Pulmonary embolism August 2011 (syncope) 02/18/2013  . NSTEMI (non-ST elevated myocardial infarction) ([place]) 02/15/2013  . Essential hypertension 02/15/2013   Teena Irani, PTA/CLT [phone]  Teena Irani 12/13/2017, 4:39 PM  [facility] [address] [place], [place], [address] Phone: [phone]   Fax:  [phone]  Name: Phyllis Conner MRN: [id number] Date of Birth: 1945-06-08

## 2017-12-14 ENCOUNTER — Telehealth (HOSPITAL_COMMUNITY): Payer: Self-pay | Admitting: Family Medicine

## 2017-12-14 ENCOUNTER — Ambulatory Visit (HOSPITAL_COMMUNITY): Payer: Medicare Other | Admitting: Physical Therapy

## 2017-12-14 NOTE — Telephone Encounter (Signed)
12/14/17  pt called to see if we had anything late this afternoon because she couldn't come at 1:00 but at the time she called there was nothing available

## 2017-12-17 ENCOUNTER — Telehealth (HOSPITAL_COMMUNITY): Payer: Self-pay | Admitting: Family Medicine

## 2017-12-17 ENCOUNTER — Ambulatory Visit (HOSPITAL_COMMUNITY): Payer: Medicare Other

## 2017-12-17 NOTE — Telephone Encounter (Signed)
12/17/17  pt called to cx but no reason was given

## 2017-12-19 ENCOUNTER — Ambulatory Visit (HOSPITAL_COMMUNITY): Payer: Medicare Other | Admitting: Physical Therapy

## 2017-12-19 DIAGNOSIS — M1612 Unilateral primary osteoarthritis, left hip: Secondary | ICD-10-CM | POA: Diagnosis not present

## 2017-12-19 DIAGNOSIS — R2689 Other abnormalities of gait and mobility: Secondary | ICD-10-CM

## 2017-12-19 DIAGNOSIS — R29898 Other symptoms and signs involving the musculoskeletal system: Secondary | ICD-10-CM

## 2017-12-19 DIAGNOSIS — M6281 Muscle weakness (generalized): Secondary | ICD-10-CM

## 2017-12-19 DIAGNOSIS — M25552 Pain in left hip: Secondary | ICD-10-CM | POA: Diagnosis not present

## 2017-12-19 DIAGNOSIS — R269 Unspecified abnormalities of gait and mobility: Secondary | ICD-10-CM | POA: Diagnosis not present

## 2017-12-19 NOTE — Therapy (Signed)
[facility] [place], [place], [address] Phone: [phone]   Fax:  [phone]  Physical Therapy Treatment  Patient Details  Name: Phyllis Conner MRN: [id number] Date of Birth: Jan 07, 1946 Referring Provider: [name], MD   Encounter Date: 12/19/2017  PT End of Session - 12/19/17 1631    Visit Number  14   reassessment complete visit #6 on 11/23/17   Number of Visits  19    Date for PT Re-Evaluation  12/21/17   MInireassess complete 11/23/17   Authorization Type  Primary: UHC Medicare; secondary: Medicaid    Authorization Time Period  11/08/17 - 12/21/17    Authorization - Visit Number  14    Authorization - Number of Visits  19    PT Start Time  6962    PT Stop Time  1650    PT Time Calculation (min)  38 min    Equipment Utilized During Treatment  Gait belt    Activity Tolerance  Patient tolerated treatment well;Patient limited by fatigue;No increased pain    Behavior During Therapy  WFL for tasks assessed/performed       Past Medical History:  Diagnosis Date  . Acid reflux   . Anemia    "when I was younger"  . Aortic stenosis    mild AS 09/14/17 echo  . Arthritis   . CAD S/P percutaneous coronary angioplasty November 2014   Mid LAD 2.5 mm x 12 mm Promus DES (2.70 mm); proximal OM1 - Promus DES 2.75 mm x 16 mm (2.85 mm)  . Diabetes mellitus type 2 in obese ([place])   . Essential hypertension   . Headache   . Hyperlipidemia with target LDL less than 70   . Non-Q wave ST elevation myocardial infarction (STEMI) involving left anterior descending (LAD) coronary artery November 2014   PCI to LAD and circumflex  . Obesity (BMI 30-39.9) 05/05/2013    Past Surgical History:  Procedure Laterality Date  . ABDOMINAL HYSTERECTOMY  1990s  . Arthroscopic knee surgery Bilateral 1984, 2002   Arthroscopic  . CARDIAC CATHETERIZATION  November 2014   Mid LAD 95%, proximal OM1 80-90% --> two-vessel PCI  . COLONOSCOPY N/A 12/05/2013    Procedure: COLONOSCOPY;  Surgeon: Beryle Beams, MD;  Location: [facility];  Service: Endoscopy;  Laterality: N/A;  . COLONOSCOPY WITH PROPOFOL N/A 12/11/2014   Procedure: COLONOSCOPY WITH PROPOFOL;  Surgeon: Carol Ada, MD;  Location: [facility];  Service: Endoscopy;  Laterality: N/A;  . ESOPHAGOGASTRODUODENOSCOPY N/A 12/05/2013   Procedure: ESOPHAGOGASTRODUODENOSCOPY (EGD);  Surgeon: Beryle Beams, MD;  Location: [facility];  Service: Endoscopy;  Laterality: N/A;  . LEFT HEART CATHETERIZATION WITH CORONARY ANGIOGRAM N/A 02/17/2013   Procedure: LEFT HEART CATHETERIZATION WITH CORONARY ANGIOGRAM;  Surgeon: Sanda Klein, MD;  Location: [facility];  Service: Cardiovascular;  Laterality: N/A;  . PERCUTANEOUS CORONARY STENT INTERVENTION (PCI-S)  November 2014   Mid LAD 2.5 mm x 12 mm (2.75 mm) Promus DES; OM1 2.75 mm x 60 mm (2.85 mm) Promus DES  . TOTAL HIP ARTHROPLASTY Left 10/17/2017  . TOTAL HIP ARTHROPLASTY Left 10/17/2017   Procedure: TOTAL HIP ARTHROPLASTY ANTERIOR APPROACH;  Surgeon: [name], MD;  Location: [facility];  Service: Orthopedics;  Laterality: Left;  . TRANSTHORACIC ECHOCARDIOGRAM  02/16/2013   EF 40-50%. Moderate concentric LVH. Moderate H. Shea of mid-distal septal and apical region. Grade 1 diastolic dysfunction. Moderate aortic sclerosis without stenosis.    There were no vitals filed for  this visit.  Subjective Assessment - 12/19/17 1616    Subjective  PT states she's been having issues with her blood and it has made her not want to do anything.  States she really has not been doing her exericses.  Currently 2/10 pain.     Currently in Pain?  Yes    Pain Score  2     Pain Location  Knee    Pain Orientation  Right    Pain Descriptors / Indicators  Aching;Burning                       [facility] Adult PT Treatment/Exercise - 12/19/17 0001      Knee/Hip Exercises: Standing   Heel Raises  20 reps    Heel Raises Limitations  toe raises on incline     Forward Lunges  Both;15 reps    Forward Lunges Limitations  2" step, 1 HHA    Side Lunges  Both;15 reps    Side Lunges Limitations  2" step    Forward Step Up  Right;Left;Step Height: 6";Step Height: 4";Limitations;15 reps    Forward Step Up Limitations  6" Lt, 4" Rt    Functional Squat  1 set;Limitations;15 reps    Functional Squat Limitations  minisquat with BUE A. Max verbal cues and tactile cues for form    SLS with Vectors  10x5'' holds each LE. Single HHA    Other Standing Knee Exercises  sidestep 14 feet x 2 roundtrips with RTB                PT Short Term Goals - 11/23/17 1700      PT SHORT TERM GOAL #1   Title  Patient will demonstrate understanding and report regular compliance of HEP in order to improve functional mobility.     Baseline  11/23/17: Patient reported trying to do exercises, but did not do them yesterday.     Time  3    Period  Weeks    Status  On-going      PT SHORT TERM GOAL #2   Title  Patient will demonstrate ability to perform single limb stance for 10 seconds on each lower extremity to improve safety and assist with ambulation and stair navigation.     Baseline  11/23/17: 2 seconds right, 1 second on the left    Time  3    Period  Weeks    Status  On-going      PT SHORT TERM GOAL #3   Title  Patient will demonstrate improvement of 1/2 grade on MMT strength testing in all deficient muscle groups except hip extension in which patient will be able to maintain full resistance in standing, in order to improve patient's ability to ambulate with improved mechanics.     Baseline  11/23/17: See MMT. Improved in some but not all.     Time  3    Period  Weeks    Status  On-going        PT Long Term Goals - 11/23/17 1701      PT LONG TERM GOAL #1   Title  Patient will demonstrate left hip AROM within functional limits to assist with ambulation mechanics and safety.     Time  6    Period  Weeks    Status  Deferred      PT LONG TERM GOAL #2    Title  Patient will demonstrate improvement of 1 grade on MMT  strength testing in all deficient muscle groups except hip extension in which patient will be able to maintain full resistance in standing, in order to improve patient's ability to ambulate with improved mechanics.     Baseline  11/23/17: Patient demonstrated improvement some, but not by 1 MMT grade. See MMT.     Time  6    Period  Weeks    Status  On-going      PT LONG TERM GOAL #3   Title  Patient will demonstrate ability to ambulate at a gait velocity of 0.8 m/s with LRAD indicating improved safety with household ambulation.     Baseline  11/23/17: Patient has demonstrated improvement in gait velocity, but is not ambulating at 0.8 m/s.     Time  6    Period  Weeks    Status  On-going      PT LONG TERM GOAL #4   Title  Patient will demonstrate an improvement of 10% on FOTO indicating an improvement in percieved functional mobility.     Time  6    Period  Weeks    Status  Deferred      PT LONG TERM GOAL #5   Title  Patient will demonstrate improvement of 10 seconds on the 5xSTS test indicating improved balance and overall functional mobility.     Time  6    Period  Weeks    Status  Deferred            Plan - 12/19/17 1632    Clinical Impression Statement  contiued with established therex with cues for form.  Pt with diffuculty using Rt quad appropriately, substituting with hip musculature.  discussed importance of doing exercises regularly for progress to be established.  Pt verbalized understanding and also reports she is now able to get her socks and shoes on without problem (daughter used to put them on for her).  Noted fatigue during session.  No complaints of pain.     Rehab Potential  Fair    Clinical Impairments Affecting Rehab Potential  Positive: Motivated; Negative: comorbidities    PT Frequency  3x / week    PT Duration  6 weeks    PT Treatment/Interventions  ADLs/Self Care Home Management;Aquatic  Therapy;DME Instruction;Gait training;Stair training;Functional mobility training;Therapeutic activities;Therapeutic exercise;Balance training;Neuromuscular re-education;Patient/family education;Orthotic Fit/Training;Manual techniques;Scar mobilization;Passive range of motion;Dry needling;Energy conservation;Taping    PT Next Visit Plan  Re-evaluate next session.    PT Home Exercise Plan  Review exercises patient has been performing at home and upadate as needed at first treatment; 11/13/17 added functional squat, standing hip ext, standing and seated heel/toe raises.        Patient will benefit from skilled therapeutic intervention in order to improve the following deficits and impairments:  Abnormal gait, Decreased balance, Decreased mobility, Difficulty walking, Decreased range of motion, Improper body mechanics, Decreased activity tolerance, Decreased knowledge of use of DME, Decreased strength, Pain  Visit Diagnosis: No diagnosis found.     Problem List Patient Active Problem List   Diagnosis Date Noted  . Primary osteoarthritis of left hip 10/17/2017  . Osteoarthritis of left hip 10/16/2017  . Preop cardiovascular exam 09/10/2017  . Orthopnea 09/10/2017  . Right carotid bruit 10/25/2013  . Diabetes mellitus type 2 in obese ([place])   . Hyperlipidemia with target LDL less than 70   . Orthostatic hypotension 08/28/2013  . Renal insufficiency 08/19/2013  . Obesity (BMI 30-39.9) 05/05/2013  . CAD S/P percutaneous coronary angioplasty -- LAD/OM1  DES 02/17/13 02/18/2013  . Pulmonary embolism August 2011 (syncope) 02/18/2013  . NSTEMI (non-ST elevated myocardial infarction) ([place]) 02/15/2013  . Essential hypertension 02/15/2013   Teena Irani, PTA/CLT [phone]  Teena Irani 12/19/2017, 4:50 PM  [facility] [address] [place], [place], [address] Phone: [phone]   Fax:  [phone]  Name: DOMINICA KENT MRN: [id number] Date of  Birth: May 21, 1945

## 2017-12-21 ENCOUNTER — Ambulatory Visit (HOSPITAL_COMMUNITY): Payer: Medicare Other

## 2017-12-21 ENCOUNTER — Encounter

## 2017-12-21 ENCOUNTER — Encounter (HOSPITAL_COMMUNITY): Payer: Self-pay

## 2017-12-21 DIAGNOSIS — R269 Unspecified abnormalities of gait and mobility: Secondary | ICD-10-CM

## 2017-12-21 DIAGNOSIS — R2689 Other abnormalities of gait and mobility: Secondary | ICD-10-CM

## 2017-12-21 DIAGNOSIS — R29898 Other symptoms and signs involving the musculoskeletal system: Secondary | ICD-10-CM | POA: Diagnosis not present

## 2017-12-21 DIAGNOSIS — M25552 Pain in left hip: Secondary | ICD-10-CM

## 2017-12-21 DIAGNOSIS — M6281 Muscle weakness (generalized): Secondary | ICD-10-CM | POA: Diagnosis not present

## 2017-12-21 DIAGNOSIS — M1612 Unilateral primary osteoarthritis, left hip: Secondary | ICD-10-CM | POA: Diagnosis not present

## 2017-12-21 NOTE — Patient Instructions (Signed)
Side-Stepping    Walk to left side with eyes open. Take even steps, leading with same foot. Make sure each foot lifts off the floor. Repeat in opposite direction. Repeat for ____ minutes per session. Do ____ sessions per day. Repeat on compliant surface: ________.  Copyright  VHI. All rights reserved.  HIP: Abduction - Standing (Band)    Place band around legs. Squeeze glutes. Raise leg out and slightly back. Hold ___ seconds. Use ________ band. ___ reps per set, ___ sets per day, ___ days per week Hold onto a support.  Copyright  VHI. All rights reserved.  Hip Extension: Standing (Single Leg)    In shoulder width stance, anchor tubing under one foot. Twist and put around other ankle. Pull same leg back, keeping knee nearly straight. Repeat __ times per set. Repeat with other leg. Do __ sets per session. Do __ sessions per week.  http://tub.exer.us/194   Copyright  VHI. All rights reserved.

## 2017-12-21 NOTE — Therapy (Signed)
[place], [place], [address] Phone: [phone]   Fax:  [phone]  Physical Therapy Treatment/Re-Evaluation/Discharge Summary  Patient Details  Name: Phyllis Conner MRN: [id number] Date of Birth: 01/05/1946 Referring Provider: [name], MD   Encounter Date: 12/21/2017   PHYSICAL THERAPY DISCHARGE SUMMARY  Visits from Start of Care: 15  Current functional level related to goals / functional outcomes:    Session focused on re-assessment. Patient has had good increases in balance, strength, functional ability, pain, perceived abilities, and decreased risk for falls. Patient feels she is independent with HEP and vows she wil continue her strengthening program at home. PT advanced her HEP to include standing hip abduction, hip extension and side stepping all with red therband. Patient is in agreement to DC from [facility] at this time. Patient is welcome to contact PT if she has any questions of concerns. Thank you for the referral.    Ambulates with SPC. Plan: Patient agrees to discharge.  Patient goals were partially met. Patient is being discharged due to being pleased with the current functional level.  ?????       PT End of Session - 12/21/17 1448    Visit Number  15   reassessment complete visit #6 on 11/23/17   Number of Visits  19    Date for PT Re-Evaluation  12/21/17   MInireassess complete 11/23/17   Authorization Type  Primary: [employer] Medicare; secondary: Medicaid    Authorization Time Period  11/08/17 - 12/21/17    Authorization - Visit Number  15    Authorization - Number of Visits  19    PT Start Time  9169    PT Stop Time  1345    PT Time Calculation (min)  38 min    Equipment Utilized During Treatment  Gait belt    Activity Tolerance  Patient tolerated treatment well;Patient limited by fatigue;No increased pain    Behavior During Therapy  WFL for tasks assessed/performed       Past Medical History:   Diagnosis Date  . Acid reflux   . Anemia    "when I was younger"  . Aortic stenosis    mild AS 09/14/17 echo  . Arthritis   . CAD S/P percutaneous coronary angioplasty November 2014   Mid LAD 2.5 mm x 12 mm Promus DES (2.70 mm); proximal OM1 - Promus DES 2.75 mm x 16 mm (2.85 mm)  . Diabetes mellitus type 2 in obese ([place])   . Essential hypertension   . Headache   . Hyperlipidemia with target LDL less than 70   . Non-Q wave ST elevation myocardial infarction (STEMI) involving left anterior descending (LAD) coronary artery November 2014   PCI to LAD and circumflex  . Obesity (BMI 30-39.9) 05/05/2013    Past Surgical History:  Procedure Laterality Date  . ABDOMINAL HYSTERECTOMY  1990s  . Arthroscopic knee surgery Bilateral 1984, 2002   Arthroscopic  . CARDIAC CATHETERIZATION  November 2014   Mid LAD 95%, proximal OM1 80-90% --> two-vessel PCI  . COLONOSCOPY N/A 12/05/2013   Procedure: COLONOSCOPY;  Surgeon: Beryle Beams, MD;  Location: [facility];  Service: Endoscopy;  Laterality: N/A;  . COLONOSCOPY WITH PROPOFOL N/A 12/11/2014   Procedure: COLONOSCOPY WITH PROPOFOL;  Surgeon: Carol Ada, MD;  Location: [facility];  Service: Endoscopy;  Laterality: N/A;  . ESOPHAGOGASTRODUODENOSCOPY N/A 12/05/2013   Procedure: ESOPHAGOGASTRODUODENOSCOPY (EGD);  Surgeon: Beryle Beams, MD;  Location: [facility];  Service: Endoscopy;  Laterality: N/A;  . LEFT HEART CATHETERIZATION WITH CORONARY ANGIOGRAM N/A 02/17/2013   Procedure: LEFT HEART CATHETERIZATION WITH CORONARY ANGIOGRAM;  Surgeon: Sanda Klein, MD;  Location: [facility];  Service: Cardiovascular;  Laterality: N/A;  . PERCUTANEOUS CORONARY STENT INTERVENTION (PCI-S)  November 2014   Mid LAD 2.5 mm x 12 mm (2.75 mm) Promus DES; OM1 2.75 mm x 60 mm (2.85 mm) Promus DES  . TOTAL HIP ARTHROPLASTY Left 10/17/2017  . TOTAL HIP ARTHROPLASTY Left 10/17/2017   Procedure: TOTAL HIP ARTHROPLASTY ANTERIOR APPROACH;  Surgeon: [name], MD;   Location: [facility];  Service: Orthopedics;  Laterality: Left;  . TRANSTHORACIC ECHOCARDIOGRAM  02/16/2013   EF 40-50%. Moderate concentric LVH. Moderate H. Shea of mid-distal septal and apical region. Grade 1 diastolic dysfunction. Moderate aortic sclerosis without stenosis.    There were no vitals filed for this visit.  Subjective Assessment - 12/21/17 1316    Subjective  No pain in the left hip today. 6/10 pain in the right knee though. Feels she has made good progress and can put on her own shoes and socks now. Still feels like she limps when she is walking. Performs her HEP.    How long can you sit comfortably?  1- 1 1/2 hrs    How long can you stand comfortably?  45 minutes    How long can you walk comfortably?  30-40 minutes    Patient Stated Goals  To walk normal     Currently in Pain?  No/denies    Pain Score  6     Pain Location  Knee    Pain Orientation  Right;Left    Pain Descriptors / Indicators  Aching         [facility] PT Assessment - 12/21/17 0001      Assessment   Medical Diagnosis  S/P Left Total hip Arthroplasty    Referring Provider  [name], MD    Onset Date/Surgical Date  10/17/17      Cognition   Overall Cognitive Status  Within Functional Limits for tasks assessed      Observation/Other Assessments   Focus on Therapeutic Outcomes (FOTO)   51% limited      AROM   Right Hip Flexion  --    Left Hip Flexion  94    Left Hip External Rotation   26    Left Hip Internal Rotation   25      Strength   Right Hip Flexion  4+/5    Right Hip Extension  4+/5    Right Hip ABduction  4+/5    Left Hip Flexion  4+/5    Left Hip Extension  4/5    Left Hip ABduction  4/5    Right Knee Flexion  4/5    Right Knee Extension  5/5    Left Knee Flexion  4/5    Left Knee Extension  5/5    Right Ankle Dorsiflexion  5/5    Left Ankle Dorsiflexion  5/5      Ambulation/Gait   Ambulation Distance (Feet)  226 Feet    Assistive device  Straight cane    Gait Pattern   Step-through pattern;Trendelenburg    Ambulation Surface  Level      Static Standing Balance   Static Standing - Balance Support  No upper extremity supported    Static Standing Balance -  Activities   Single Leg Stance - Right Leg;Single Leg Stance - Left Leg  Static Standing - Comment/# of Minutes  Rt - 7 sec; Lt - 8 sec      Standardized Balance Assessment   Five times sit to stand comments   22.83 sec                   [facility] Adult PT Treatment/Exercise - 12/21/17 0001      Knee/Hip Exercises: Standing   Hip Extension  Stengthening;Both;1 set;10 reps   and hip abd both with RTB   Other Standing Knee Exercises  sidestep 14 feet with RTB              PT Education - 12/21/17 1447    Education Details  Educated patient on re-assessment finding and advanced HEP    Person(s) Educated  Patient    Methods  Explanation;Demonstration;Handout    Comprehension  Verbalized understanding;Returned demonstration       PT Short Term Goals - 12/21/17 1453      PT SHORT TERM GOAL #1   Title  Patient will demonstrate understanding and report regular compliance of HEP in order to improve functional mobility.     Baseline  11/23/17: Patient reported trying to do exercises, but did not do them yesterday.     Time  3    Period  Weeks    Status  Achieved      PT SHORT TERM GOAL #2   Title  Patient will demonstrate ability to perform single limb stance for 10 seconds on each lower extremity to improve safety and assist with ambulation and stair navigation.     Baseline  11/23/17: 2 seconds right, 1 second on the left; rt 7 sec; lt 8 sec    Time  3    Period  Weeks    Status  Partially Met      PT SHORT TERM GOAL #3   Title  Patient will demonstrate improvement of 1/2 grade on MMT strength testing in all deficient muscle groups except hip extension in which patient will be able to maintain full resistance in standing, in order to improve patient's ability to ambulate with  improved mechanics.     Baseline  11/23/17: See MMT. Improved in some but not all.     Time  3    Period  Weeks    Status  Achieved        PT Long Term Goals - 12/21/17 1453      PT LONG TERM GOAL #1   Title  Patient will demonstrate left hip AROM within functional limits to assist with ambulation mechanics and safety.     Time  6    Period  Weeks    Status  Achieved      PT LONG TERM GOAL #2   Title  Patient will demonstrate improvement of 1 grade on MMT strength testing in all deficient muscle groups except hip extension in which patient will be able to maintain full resistance in standing, in order to improve patient's ability to ambulate with improved mechanics.     Baseline  11/23/17: Patient demonstrated improvement some, but not by 1 MMT grade. See MMT.     Time  6    Period  Weeks    Status  Partially Met      PT LONG TERM GOAL #3   Title  Patient will demonstrate ability to ambulate at a gait velocity of 0.8 m/s with LRAD indicating improved safety with household ambulation.     Baseline  11/23/17: Patient has demonstrated improvement in gait velocity, but is not ambulating at 0.8 m/s.     Time  6    Period  Weeks    Status  Partially Met      PT LONG TERM GOAL #4   Title  Patient will demonstrate an improvement of 10% on FOTO indicating an improvement in percieved functional mobility.     Baseline  initial 66% limited; 12/21/2017 - 51% limited    Time  6    Period  Weeks    Status  Achieved      PT LONG TERM GOAL #5   Title  Patient will demonstrate improvement of 10 seconds on the 5xSTS test indicating improved balance and overall functional mobility.     Baseline  initial - 30.16 sec; 12/21/2017 - 22.83 sec    Time  6    Period  Weeks    Status  Partially Met            Plan - 12/21/17 1336    Clinical Impression Statement  Session focused on re-assessment. Patient has had good increases in balance, strength, functional ability, pain, perceived abilities,  and decreased risk for falls. Patient feels she is independent with HEP and vows she wil continue her strengthening program at home. PT advanced her HEP to include standing hip abduction, hip extension and side stepping all with red therband. Patient is in agreement to DC from [facility] at this time. Patient is welcome to contact PT if she has any questions of concerns. Thank you for the referral.    History and Personal Factors relevant to plan of care:  CHF, MI, HTN, T2DM, vertigo, Lt THA 10/17/17, history of knee scope    Clinical Presentation  Stable    Clinical Presentation due to:  FOTO, MMT, ROM, 5xSTS, SLS, 2MWT, clinical judgement    Rehab Potential  Fair    Clinical Impairments Affecting Rehab Potential  Positive: Motivated; Negative: comorbidities    PT Frequency  2x / week    PT Duration  6 weeks    PT Treatment/Interventions  ADLs/Self Care Home Management;Aquatic Therapy;DME Instruction;Gait training;Stair training;Functional mobility training;Therapeutic activities;Therapeutic exercise;Balance training;Neuromuscular re-education;Patient/family education;Orthotic Fit/Training;Manual techniques;Scar mobilization;Passive range of motion;Dry needling;Energy conservation;Taping    PT Next Visit Plan  DC from [place]  Review exercises patient has been performing at home and upadate as needed at first treatment; 11/13/17 added functional squat, standing hip ext, standing and seated heel/toe raises. 12/21/2017 - standing hip abd/ext and side stepping with RTB.    Consulted and Agree with Plan of Care  Patient       Patient will benefit from skilled therapeutic intervention in order to improve the following deficits and impairments:  Abnormal gait, Decreased balance, Decreased mobility, Difficulty walking, Decreased range of motion, Improper body mechanics, Decreased activity tolerance, Decreased knowledge of use of DME, Decreased strength, Pain  Visit Diagnosis: Muscle  weakness (generalized)  Other abnormalities of gait and mobility  Other symptoms and signs involving the musculoskeletal system  Pain in left hip  Arthritis of left hip  Abnormality of gait and mobility     Problem List Patient Active Problem List   Diagnosis Date Noted  . Primary osteoarthritis of left hip 10/17/2017  . Osteoarthritis of left hip 10/16/2017  . Preop cardiovascular exam 09/10/2017  . Orthopnea 09/10/2017  . Right carotid bruit 10/25/2013  . Diabetes mellitus type 2 in obese ([place])   . Hyperlipidemia  with target LDL less than 70   . Orthostatic hypotension 08/28/2013  . Renal insufficiency 08/19/2013  . Obesity (BMI 30-39.9) 05/05/2013  . CAD S/P percutaneous coronary angioplasty -- LAD/OM1 DES 02/17/13 02/18/2013  . Pulmonary embolism August 2011 (syncope) 02/18/2013  . NSTEMI (non-ST elevated myocardial infarction) ([place]) 02/15/2013  . Essential hypertension 02/15/2013    Floria Raveling. Hartnett-Rands, MS, PT Per [facility] #[id number] 12/21/2017, 2:58 PM  [facility] [address] [place], [place], [address] Phone: [phone]   Fax:  [phone]  Name: Phyllis Conner MRN: [id number] Date of Birth: [birth year]

## 2018-01-14 ENCOUNTER — Other Ambulatory Visit: Payer: Self-pay | Admitting: Cardiology

## 2018-01-21 DIAGNOSIS — D509 Iron deficiency anemia, unspecified: Secondary | ICD-10-CM | POA: Diagnosis not present

## 2018-01-21 DIAGNOSIS — I10 Essential (primary) hypertension: Secondary | ICD-10-CM | POA: Diagnosis not present

## 2018-01-21 DIAGNOSIS — E1100 Type 2 diabetes mellitus with hyperosmolarity without nonketotic hyperglycemic-hyperosmolar coma (NKHHC): Secondary | ICD-10-CM | POA: Diagnosis not present

## 2018-01-28 ENCOUNTER — Other Ambulatory Visit: Payer: Self-pay | Admitting: Cardiology

## 2018-01-29 NOTE — Telephone Encounter (Signed)
Rx request sent to pharmacy.  

## 2018-02-21 DIAGNOSIS — E118 Type 2 diabetes mellitus with unspecified complications: Secondary | ICD-10-CM | POA: Diagnosis not present

## 2018-02-21 DIAGNOSIS — I119 Hypertensive heart disease without heart failure: Secondary | ICD-10-CM | POA: Diagnosis not present

## 2018-02-21 DIAGNOSIS — E1169 Type 2 diabetes mellitus with other specified complication: Secondary | ICD-10-CM | POA: Diagnosis not present

## 2018-02-21 DIAGNOSIS — I252 Old myocardial infarction: Secondary | ICD-10-CM | POA: Diagnosis not present

## 2018-03-13 DIAGNOSIS — Z96642 Presence of left artificial hip joint: Secondary | ICD-10-CM | POA: Diagnosis not present

## 2018-03-13 DIAGNOSIS — M545 Low back pain: Secondary | ICD-10-CM | POA: Diagnosis not present

## 2018-03-13 DIAGNOSIS — M25552 Pain in left hip: Secondary | ICD-10-CM | POA: Diagnosis not present

## 2018-03-19 DIAGNOSIS — H04123 Dry eye syndrome of bilateral lacrimal glands: Secondary | ICD-10-CM | POA: Diagnosis not present

## 2018-03-19 DIAGNOSIS — H52223 Regular astigmatism, bilateral: Secondary | ICD-10-CM | POA: Diagnosis not present

## 2018-03-19 DIAGNOSIS — E119 Type 2 diabetes mellitus without complications: Secondary | ICD-10-CM | POA: Diagnosis not present

## 2018-03-19 DIAGNOSIS — H5202 Hypermetropia, left eye: Secondary | ICD-10-CM | POA: Diagnosis not present

## 2018-05-24 DIAGNOSIS — I10 Essential (primary) hypertension: Secondary | ICD-10-CM | POA: Diagnosis not present

## 2018-05-24 DIAGNOSIS — E118 Type 2 diabetes mellitus with unspecified complications: Secondary | ICD-10-CM | POA: Diagnosis not present

## 2018-05-24 DIAGNOSIS — E785 Hyperlipidemia, unspecified: Secondary | ICD-10-CM | POA: Diagnosis not present

## 2018-05-24 DIAGNOSIS — E7800 Pure hypercholesterolemia, unspecified: Secondary | ICD-10-CM | POA: Diagnosis not present

## 2018-06-03 ENCOUNTER — Other Ambulatory Visit: Payer: Self-pay | Admitting: Cardiology

## 2018-06-03 DIAGNOSIS — Z9861 Coronary angioplasty status: Principal | ICD-10-CM

## 2018-06-03 DIAGNOSIS — I2510 Atherosclerotic heart disease of native coronary artery without angina pectoris: Secondary | ICD-10-CM

## 2018-06-12 DIAGNOSIS — Z0000 Encounter for general adult medical examination without abnormal findings: Secondary | ICD-10-CM | POA: Diagnosis not present

## 2018-06-12 DIAGNOSIS — E1169 Type 2 diabetes mellitus with other specified complication: Secondary | ICD-10-CM | POA: Diagnosis not present

## 2018-07-08 ENCOUNTER — Other Ambulatory Visit: Payer: Self-pay | Admitting: Cardiology

## 2018-08-20 DIAGNOSIS — M25552 Pain in left hip: Secondary | ICD-10-CM | POA: Diagnosis not present

## 2018-08-22 DIAGNOSIS — G479 Sleep disorder, unspecified: Secondary | ICD-10-CM | POA: Diagnosis not present

## 2018-08-22 DIAGNOSIS — E785 Hyperlipidemia, unspecified: Secondary | ICD-10-CM | POA: Diagnosis not present

## 2018-08-22 DIAGNOSIS — I10 Essential (primary) hypertension: Secondary | ICD-10-CM | POA: Diagnosis not present

## 2018-08-22 DIAGNOSIS — E1169 Type 2 diabetes mellitus with other specified complication: Secondary | ICD-10-CM | POA: Diagnosis not present

## 2018-08-22 DIAGNOSIS — I119 Hypertensive heart disease without heart failure: Secondary | ICD-10-CM | POA: Diagnosis not present

## 2018-08-23 ENCOUNTER — Other Ambulatory Visit (HOSPITAL_COMMUNITY): Payer: Self-pay | Admitting: Family Medicine

## 2018-08-23 DIAGNOSIS — Z1231 Encounter for screening mammogram for malignant neoplasm of breast: Secondary | ICD-10-CM

## 2018-08-30 ENCOUNTER — Other Ambulatory Visit: Payer: Self-pay

## 2018-08-30 ENCOUNTER — Ambulatory Visit (HOSPITAL_COMMUNITY)
Admission: RE | Admit: 2018-08-30 | Discharge: 2018-08-30 | Disposition: A | Discharge status: Home / Self Care | Payer: Medicare Other | Source: Ambulatory Visit | Attending: Family Medicine | Admitting: Family Medicine

## 2018-08-30 DIAGNOSIS — Z1231 Encounter for screening mammogram for malignant neoplasm of breast: Secondary | ICD-10-CM | POA: Diagnosis not present

## 2018-09-06 ENCOUNTER — Other Ambulatory Visit: Payer: Self-pay | Admitting: Cardiology

## 2018-09-06 DIAGNOSIS — I2510 Atherosclerotic heart disease of native coronary artery without angina pectoris: Secondary | ICD-10-CM

## 2018-09-06 DIAGNOSIS — Z9861 Coronary angioplasty status: Secondary | ICD-10-CM

## 2018-09-12 ENCOUNTER — Other Ambulatory Visit: Payer: Self-pay | Admitting: Cardiology

## 2018-11-16 ENCOUNTER — Other Ambulatory Visit: Payer: Self-pay | Admitting: Cardiology

## 2018-11-19 ENCOUNTER — Telehealth: Payer: Self-pay | Admitting: Cardiology

## 2018-11-19 NOTE — Telephone Encounter (Signed)
Pt overdue for 12 month f/u. Please contact pt for future appointment. Pt needing refills. 

## 2018-11-19 NOTE — Telephone Encounter (Signed)
LVM for patient to call and schedule 1 year followup with Dr. Ellyn Hack.

## 2018-11-20 ENCOUNTER — Other Ambulatory Visit: Payer: Self-pay | Admitting: Cardiology

## 2018-11-20 NOTE — Telephone Encounter (Signed)
Rx(s) sent to pharmacy electronically.  

## 2018-11-29 DIAGNOSIS — I10 Essential (primary) hypertension: Secondary | ICD-10-CM | POA: Diagnosis not present

## 2018-11-29 DIAGNOSIS — E1169 Type 2 diabetes mellitus with other specified complication: Secondary | ICD-10-CM | POA: Diagnosis not present

## 2018-11-29 DIAGNOSIS — I259 Chronic ischemic heart disease, unspecified: Secondary | ICD-10-CM | POA: Diagnosis not present

## 2018-12-25 ENCOUNTER — Other Ambulatory Visit: Payer: Self-pay | Admitting: Cardiology

## 2018-12-26 ENCOUNTER — Other Ambulatory Visit: Payer: Self-pay | Admitting: Cardiology

## 2018-12-27 ENCOUNTER — Other Ambulatory Visit: Payer: Self-pay | Admitting: Cardiology

## 2019-01-01 ENCOUNTER — Other Ambulatory Visit: Payer: Self-pay | Admitting: Cardiology

## 2019-01-08 ENCOUNTER — Other Ambulatory Visit: Payer: Self-pay

## 2019-01-08 ENCOUNTER — Encounter: Payer: Self-pay | Admitting: Cardiology

## 2019-01-08 ENCOUNTER — Ambulatory Visit (INDEPENDENT_AMBULATORY_CARE_PROVIDER_SITE_OTHER): Payer: Medicare Other | Admitting: Cardiology

## 2019-01-08 VITALS — BP 175/91 | HR 57 | Temp 96.8°F | Ht 64.0 in | Wt 229.4 lb

## 2019-01-08 DIAGNOSIS — Z9861 Coronary angioplasty status: Secondary | ICD-10-CM

## 2019-01-08 DIAGNOSIS — I214 Non-ST elevation (NSTEMI) myocardial infarction: Secondary | ICD-10-CM | POA: Diagnosis not present

## 2019-01-08 DIAGNOSIS — I2510 Atherosclerotic heart disease of native coronary artery without angina pectoris: Secondary | ICD-10-CM | POA: Diagnosis not present

## 2019-01-08 DIAGNOSIS — E669 Obesity, unspecified: Secondary | ICD-10-CM

## 2019-01-08 DIAGNOSIS — I10 Essential (primary) hypertension: Secondary | ICD-10-CM | POA: Diagnosis not present

## 2019-01-08 DIAGNOSIS — E785 Hyperlipidemia, unspecified: Secondary | ICD-10-CM | POA: Diagnosis not present

## 2019-01-08 DIAGNOSIS — E1169 Type 2 diabetes mellitus with other specified complication: Secondary | ICD-10-CM

## 2019-01-08 MED ORDER — AMLODIPINE BESYLATE 10 MG PO TABS: 10.0000 mg | ORAL_TABLET | Freq: Every day | ORAL | 3 refills | Status: DC

## 2019-01-08 MED ORDER — ATORVASTATIN CALCIUM 40 MG PO TABS: 40.0000 mg | ORAL_TABLET | Freq: Every day | ORAL | 3 refills | Status: DC

## 2019-01-08 MED ORDER — HYDROCHLOROTHIAZIDE 25 MG PO TABS: 25.0000 mg | ORAL_TABLET | Freq: Every day | ORAL | 3 refills | Status: DC

## 2019-01-08 NOTE — Patient Instructions (Addendum)
Medication Instructions:  -stop  Rosuvastatin- crestor   - increase  Atorvastatin 40 mg  Daily   - increase AMLODIPINE 10 mg daily   Start Hydrochlorothiazide ( hctz)  25 mg daily     If you need a refill on your cardiac medications before your next appointment, please call your pharmacy.   Lab work:  Labs in one week -- BMP  Testing/Procedures: Not needed  Follow-Up: At [facility], you and your health needs are our priority.  As part of our continuing mission to provide you with exceptional heart care, we have created designated Provider Care Teams.  These Care Teams include your primary Cardiologist (physician) and Advanced Practice Providers (APPs -  Physician Assistants and Nurse Practitioners) who all work together to provide you with the care you need, when you need it. . You will need a follow up appointment in 12  months.  Please call our office 2 months in advance to schedule this appointment.  You may see Glenetta Hew, MD or one of the following Advanced Practice Providers on your designated Care Team:   . Rosaria Ferries, PA-C . Jory Sims, DNP, ANP  Any Other Special Instructions Will Be Listed Below (If Applicable).   have blood pressure check by  Primary

## 2019-01-08 NOTE — Progress Notes (Signed)
PCP: Lucianne Lei, MD  Clinic Note: Chief Complaint  Patient presents with  . Follow-up    Delayed annual  . Coronary Artery Disease  . Hypertension    HPI: Phyllis Conner is a 73 y.o. female with a PMH below who presents today for ~ annual f/u for CAD-PCI in setting of NSTEMI 02/2013 -- being seen early for Pre-op L Hip Sgx evaluation  Phyllis Conner was last seen in June 2019-mostly for preop evaluation for hip surgery.  She had hip arthroplasty in July after this visit.  We will check an echocardiogram in June to ensure stable EF.  Recent Hospitalizations:   None (ER visit with Hip pain)  Studies Personally Reviewed - (if available, images/films reviewed: From Epic Chart or Care Everywhere)  Echo 09/14/17 - Normal EF 55-60%. No RWMA.  Gr 1-2DD. Mild AS. (mean gradient 21 mmHg)   Interval History: Phyllis Conner now presents for delayed annual follow-up.  She says that she is counter back to doing her walking now that her hip surgery has been done.  She actually has more back pain bothering her since her surgery.  C tells me that today she started having little of cold and congestion type symptoms that she thinks is related to an allergy.  No fevers or chills or real cough.  Chest congestion.  She says that she is always had some intermittent off-and-on chest discomfort with or without walking that is relatively short-lived.  May not happen for a week or month and then would come back for couple days. She had been doing a lot of exercise after her hip surgery and had done a really good job losing weight.  But with the COVID 19 restrictions, she got out of going to the gym and has gained back a lot of weight.  18 the pounds that she had lost according to current scale.  She tells me she got down as low 211 pounds.  She denies any PND or orthopnea, does have some and today edema that usually goes down pretty well with elevating her feet.  Usually is gone in the morning. She tells me  that she has occasional episodes where she feels that her heart rates may go up for a few seconds and then go back down again to regular but she denies any rapid prolonged heart rates and regular heart rates.  No syncope/near syncope or TIA/amaurosis fugax.  She has not been taking her blood pressure today, but has noted that her blood pressures been going up a little bit of late.  No claudication  ROS: A comprehensive was performed. Review of Systems  Constitutional: Negative for chills, fever and weight loss (Actually gained weight because of lack of exercise.).  HENT: Positive for congestion. Negative for nosebleeds.   Respiratory: Positive for shortness of breath (At baseline, but no change). Negative for cough and wheezing.   Cardiovascular: Negative for claudication (Bilateral leg pain off and on difficult to distinguish between claudication and joint pain.  ).  Gastrointestinal: Negative for blood in stool and melena.  Genitourinary: Negative for hematuria.  Musculoskeletal: Positive for back pain (She now has knee pain but mostly back pain) and joint pain.  Neurological: Positive for dizziness (Sometimes positional, but not routine).  Endo/Heme/Allergies: Does not bruise/bleed easily.  Psychiatric/Behavioral: Negative.   All other systems reviewed and are negative.  The patient does not have symptoms concerning for COVID-19 infection (fever, chills, cough, or new shortness of breath).  She  digestion, but none none of the other symptoms. The patient is practicing social distancing.   COVID-19 Education: The signs and symptoms of COVID-19 were discussed with the patient and how to seek care for testing (follow up with PCP or arrange E-visit).   The importance of social distancing was discussed today.   I have reviewed and (if needed) personally updated the patient's problem list, medications, allergies, past medical and surgical history, social and family history.   Past Medical  History:  Diagnosis Date  . Acid reflux   . Anemia    "when I was younger"  . Aortic stenosis    mild AS 09/14/17 echo  . Arthritis   . CAD S/P percutaneous coronary angioplasty November 2014   Mid LAD 2.5 mm x 12 mm Promus DES (2.70 mm); proximal OM1 - Promus DES 2.75 mm x 16 mm (2.85 mm)  . Diabetes mellitus type 2 in obese ([place])   . Essential hypertension   . Headache   . Hyperlipidemia with target LDL less than 70   . Non-Q wave ST elevation myocardial infarction (STEMI) involving left anterior descending (LAD) coronary artery November 2014   PCI to LAD and circumflex  . Obesity (BMI 30-39.9) 05/05/2013    Past Surgical History:  Procedure Laterality Date  . ABDOMINAL HYSTERECTOMY  1990s  . Arthroscopic knee surgery Bilateral 1984, 2002   Arthroscopic  . CARDIAC CATHETERIZATION  November 2014   Mid LAD 95%, proximal OM1 80-90% --> two-vessel PCI  . COLONOSCOPY N/A 12/05/2013   Procedure: COLONOSCOPY;  Surgeon: Beryle Beams, MD;  Location: [facility];  Service: Endoscopy;  Laterality: N/A;  . COLONOSCOPY WITH PROPOFOL N/A 12/11/2014   Procedure: COLONOSCOPY WITH PROPOFOL;  Surgeon: Carol Ada, MD;  Location: [facility];  Service: Endoscopy;  Laterality: N/A;  . ESOPHAGOGASTRODUODENOSCOPY N/A 12/05/2013   Procedure: ESOPHAGOGASTRODUODENOSCOPY (EGD);  Surgeon: Beryle Beams, MD;  Location: [facility];  Service: Endoscopy;  Laterality: N/A;  . LEFT HEART CATHETERIZATION WITH CORONARY ANGIOGRAM N/A 02/17/2013   Procedure: LEFT HEART CATHETERIZATION WITH CORONARY ANGIOGRAM;  Surgeon: Sanda Klein, MD;  Location: [facility];  Service: Cardiovascular;  Laterality: N/A;  . PERCUTANEOUS CORONARY STENT INTERVENTION (PCI-S)  November 2014   Mid LAD 2.5 mm x 12 mm (2.75 mm) Promus DES; OM1 2.75 mm x 60 mm (2.85 mm) Promus DES  . TOTAL HIP ARTHROPLASTY Left 10/17/2017  . TOTAL HIP ARTHROPLASTY Left 10/17/2017   Procedure: TOTAL HIP ARTHROPLASTY ANTERIOR APPROACH;  Surgeon: [name], MD;  Location: [facility];  Service: Orthopedics;  Laterality: Left;  . TRANSTHORACIC ECHOCARDIOGRAM  09/2017    Normal EF 55-60%. No RWMA.  Gr 1-2DD.  Mild AS. (mean gradient 21 mmHg)     Current Meds  Medication Sig  . ACCU-CHEK AVIVA PLUS test strip 1 each by Other route 2 (two) times daily.   Marland Kitchen BRILINTA 60 MG TABS tablet TAKE 1 TABLET BY MOUTH TWICE DAILY  . HUMALOG 100 UNIT/ML injection Inject 20 Units into the skin continuous.   . Insulin Disposable Pump (V-GO 20) KIT Humalog  . isosorbide mononitrate (IMDUR) 30 MG 24 hr tablet TAKE 1 TABLET(30 MG) BY MOUTH DAILY  . Menthol, Topical Analgesic, (ICY HOT MEDICATED SPRAY EX) Apply 1 spray topically daily as needed (pain).  . metFORMIN (GLUCOPHAGE) 500 MG tablet Take 1 tablet (500 mg total) by mouth daily. (Patient taking differently: Take 500 mg by mouth every evening. )  . metoprolol tartrate (LOPRESSOR) 25 MG tablet TAKE 1  TABLET BY MOUTH TWICE DAILY  . nitroGLYCERIN (NITROSTAT) 0.4 MG SL tablet Place 1 tablet (0.4 mg total) under the tongue every 5 (five) minutes x 3 doses as needed for chest pain.  . potassium chloride SA (K-DUR,KLOR-CON) 20 MEQ tablet Take 20 mEq by mouth daily.   . [DISCONTINUED] amLODipine (NORVASC) 5 MG tablet TAKE 1 TABLET BY MOUTH DAILY  . [DISCONTINUED] aspirin EC 81 MG tablet Take 1 tablet (81 mg total) by mouth 2 (two) times daily.  . [DISCONTINUED] atorvastatin (LIPITOR) 80 MG tablet TAKE 1 TABLET BY MOUTH DAILY AT 6 PM  . [DISCONTINUED] oxyCODONE-acetaminophen (PERCOCET/ROXICET) 5-325 MG tablet Take 1 tablet by mouth every 4 (four) hours as needed for severe pain.  . [DISCONTINUED] tiZANidine (ZANAFLEX) 2 MG tablet Take 1 tablet (2 mg total) by mouth every 6 (six) hours as needed.    Allergies  Allergen Reactions  . Iohexol Hives and Other (See Comments)    Excessive sweating    Social History   Tobacco Use  . Smoking status: Never Smoker  . Smokeless tobacco: Never Used  Substance Use Topics   . Alcohol use: No  . Drug use: No   Social History   Social History Narrative   She is married with 4 children.   She does not smoke and does not drink alcohol.    family history includes Asthma in her mother; Cancer in her father; Heart attack in her mother; Heart disease in her brother, mother, and sister.  Wt Readings from Last 3 Encounters:  01/08/19 229 lb 6.4 oz (104.1 kg)  10/17/17 233 lb (105.7 kg)  10/08/17 233 lb 6.4 oz (105.9 kg)  -- lost down to 211 - now up     PHYSICAL EXAM BP (!) 175/91   Pulse (!) 57   Temp (!) 96.8 F (36 C)   Ht 5' 4"  (1.626 m)   Wt 229 lb 6.4 oz (104.1 kg)   SpO2 99%   BMI 39.38 kg/m  Physical Exam  Constitutional: She is oriented to person, place, and time.  Morbidly obese but otherwise healthy appearing and well-groomed.  No acute distress.  HENT:  Head: Normocephalic and atraumatic.  Neck: Normal range of motion. Neck supple. No hepatojugular reflux (Cannot really assess) and no JVD present. Carotid bruit is present (Soft right-sided bruit versus radiated aortic murmur).  Cardiovascular: Regular rhythm, S1 normal, S2 normal and intact distal pulses.  No extrasystoles are present. Bradycardia present. PMI is not displaced (Unable to palpate). Exam reveals distant heart sounds. Exam reveals no gallop and no friction rub.  Murmur heard. High-pitched harsh crescendo-decrescendo midsystolic murmur is present with a grade of 2/6 at the upper right sternal border radiating to the neck. Pulmonary/Chest: Effort normal and breath sounds normal. No respiratory distress. She has no wheezes. She has no rales.  Abdominal: Soft. Bowel sounds are normal. She exhibits no distension. There is no abdominal tenderness. There is no rebound.  Morbidly obese-truncal.  Unable to palpate HSM  Musculoskeletal: Normal range of motion.        General: Edema (Trivial) present.  Neurological: She is alert and oriented to person, place, and time.  Psychiatric: She  has a normal mood and affect. Her behavior is normal. Judgment and thought content normal.  Vitals reviewed.    Adult ECG Report  Rate: 54;  Rhythm: sinus bradycardia and Normal axis, intervals and durations;   Narrative Interpretation: Stable/normal EKG   Other studies Reviewed: Additional studies/ records that were reviewed  today include:  Recent Labs: Labs from November 29, 2018  -TC 147, TG 90, HDL 53, LDL 77.  A1c 8.5. BUN/Cr 18/1.23, K+ 4.5.     ASSESSMENT / PLAN: Problem List Items Addressed This Visit    CAD S/P percutaneous coronary angioplasty -- LAD/OM1 DES 02/17/13 - Primary (Chronic)    Doing fairly well.  She does not really have classic anginal symptoms.  What she is feeling is probably more musculoskeletal in nature.  She is now just about 6 years out from her MI.  I think we can probably have her switch over to aspirin after completion of current bottle of Brilinta.  She is on high-dose high intensity statin along with beta-blocker, Imdur and amlodipine.        Relevant Medications   hydrochlorothiazide (HYDRODIURIL) 25 MG tablet   amLODipine (NORVASC) 10 MG tablet   atorvastatin (LIPITOR) 40 MG tablet   Other Relevant Orders   EKG 12-Lead (Completed)   Basic metabolic panel   Obesity (BMI 30-39.9) (Chronic)    Now that her hip has been repaired, she needs to get back into exercising.  With things opening back up after COVID-19 lockdown, hopefully she will get back on the bandwagon of weight loss and shoot to get back down to the 200 -210 pound range that she had just about gotten to.      NSTEMI (non-ST elevated myocardial infarction) (HCC) (Chronic)    Distant history of MI, 6 years ago.  No recurrent angina or heart failure symptoms.  EF finally looks like is back to normal based on last year's echo.      Relevant Medications   hydrochlorothiazide (HYDRODIURIL) 25 MG tablet   amLODipine (NORVASC) 10 MG tablet   atorvastatin (LIPITOR) 40 MG tablet    Other Relevant Orders   EKG 12-Lead (Completed)   Basic metabolic panel   Essential hypertension (Chronic)    Blood pressure does not look well controlled.  We will add HCTZ and increase amlodipine to 10 mg daily. HCTZ should help to manage the edema and diastolic dysfunction.  Would probably consider switching to carvedilol for metoprolol if additional blood pressure control is needed.  She also had been taking 100 mg of losartan --> need to confirm dosing.      Relevant Medications   hydrochlorothiazide (HYDRODIURIL) 25 MG tablet   amLODipine (NORVASC) 10 MG tablet   atorvastatin (LIPITOR) 40 MG tablet   Other Relevant Orders   EKG 12-Lead (Completed)   Basic metabolic panel   Hyperlipidemia with target LDL less than 70 (Chronic)    She was supposed to see our lipid clinic pharmacists to discuss changes.  A little bit confused as to what medications he is taking.  I have 2 statins listed.  We have settled on atorvastatin 40 mg, depending on what lipids look like and follow-up, would either go back to 80 mg atorvastatin or switch to rosuvastatin 40 mg.      Relevant Medications   hydrochlorothiazide (HYDRODIURIL) 25 MG tablet   amLODipine (NORVASC) 10 MG tablet   atorvastatin (LIPITOR) 40 MG tablet   Diabetes mellitus type 2 in obese ([place])    Followed by PCP.  Would consider the benefit of SGLT 2 inhibitor such as [place] or [place].      Relevant Medications   atorvastatin (LIPITOR) 40 MG tablet        Current medicines are reviewed at length with the patient today.  (+/- concerns) n/a The following changes have  been made:  Consider increasing metoprolol up to 50 twice daily  Patient Instructions  Medication Instructions:  -stop  Rosuvastatin- crestor   - increase  Atorvastatin 40 mg  Daily   - increase AMLODIPINE 10 mg daily   Start Hydrochlorothiazide ( hctz)  25 mg daily     If you need a refill on your cardiac medications before your next appointment,  please call your pharmacy.   Lab work:  Labs in one week -- BMP  Testing/Procedures: Not needed  Follow-Up: At [facility], you and your health needs are our priority.  As part of our continuing mission to provide you with exceptional heart care, we have created designated Provider Care Teams.  These Care Teams include your primary Cardiologist (physician) and Advanced Practice Providers (APPs -  Physician Assistants and Nurse Practitioners) who all work together to provide you with the care you need, when you need it. . You will need a follow up appointment in 12  months.  Please call our office 2 months in advance to schedule this appointment.  You may see Glenetta Hew, MD or one of the following Advanced Practice Providers on your designated Care Team:   . Rosaria Ferries, PA-C . Jory Sims, DNP, ANP  Any Other Special Instructions Will Be Listed Below (If Applicable).   have blood pressure check by  Primary     Studies Ordered:   Orders Placed This Encounter  Procedures  . Basic metabolic panel  . EKG 12-Lead      Glenetta Hew, M.D., M.S. Interventional Cardiologist   Pager # [phone] Phone # [phone] [address] [place], [place] [address]   Thank you for choosing Heartcare at [facility]!!

## 2019-01-12 ENCOUNTER — Encounter: Payer: Self-pay | Admitting: Cardiology

## 2019-01-12 NOTE — Assessment & Plan Note (Signed)
Followed by PCP.  Would consider the benefit of SGLT 2 inhibitor such as [place] or [place].

## 2019-01-12 NOTE — Assessment & Plan Note (Signed)
Blood pressure does not look well controlled.  We will add HCTZ and increase amlodipine to 10 mg daily. HCTZ should help to manage the edema and diastolic dysfunction.  Would probably consider switching to carvedilol for metoprolol if additional blood pressure control is needed.  She also had been taking 100 mg of losartan --> need to confirm dosing.

## 2019-01-12 NOTE — Assessment & Plan Note (Addendum)
Distant history of MI, 6 years ago.  No recurrent angina or heart failure symptoms.  EF finally looks like is back to normal based on last year's echo.

## 2019-01-12 NOTE — Assessment & Plan Note (Signed)
Now that her hip has been repaired, she needs to get back into exercising.  With things opening back up after COVID-19 lockdown, hopefully she will get back on the bandwagon of weight loss and shoot to get back down to the 200 -210 pound range that she had just about gotten to.

## 2019-01-12 NOTE — Assessment & Plan Note (Signed)
Doing fairly well.  She does not really have classic anginal symptoms.  What she is feeling is probably more musculoskeletal in nature.  She is now just about 6 years out from her MI.  I think we can probably have her switch over to aspirin after completion of current bottle of Brilinta.  She is on high-dose high intensity statin along with beta-blocker, Imdur and amlodipine.

## 2019-01-12 NOTE — Assessment & Plan Note (Signed)
She was supposed to see our lipid clinic pharmacists to discuss changes.  A little bit confused as to what medications he is taking.  I have 2 statins listed.  We have settled on atorvastatin 40 mg, depending on what lipids look like and follow-up, would either go back to 80 mg atorvastatin or switch to rosuvastatin 40 mg.

## 2019-01-16 DIAGNOSIS — I214 Non-ST elevation (NSTEMI) myocardial infarction: Secondary | ICD-10-CM | POA: Diagnosis not present

## 2019-01-16 DIAGNOSIS — Z9861 Coronary angioplasty status: Secondary | ICD-10-CM | POA: Diagnosis not present

## 2019-01-16 DIAGNOSIS — I10 Essential (primary) hypertension: Secondary | ICD-10-CM | POA: Diagnosis not present

## 2019-01-16 DIAGNOSIS — I2510 Atherosclerotic heart disease of native coronary artery without angina pectoris: Secondary | ICD-10-CM | POA: Diagnosis not present

## 2019-01-17 LAB — BASIC METABOLIC PANEL
BUN/Creatinine Ratio: 18 (ref 12–28)
BUN: 24 mg/dL (ref 8–27)
CO2: 25 mmol/L (ref 20–29)
Calcium: 9.5 mg/dL (ref 8.7–10.3)
Chloride: 99 mmol/L (ref 96–106)
Creatinine, Ser: 1.36 mg/dL — ABNORMAL HIGH (ref 0.57–1.00)
GFR calc Af Amer: 45 mL/min/{1.73_m2} — ABNORMAL LOW (ref >59)
GFR calc non Af Amer: 39 mL/min/{1.73_m2} — ABNORMAL LOW (ref >59)
Glucose: 174 mg/dL — ABNORMAL HIGH (ref 65–99)
Potassium: 3.6 mmol/L (ref 3.5–5.2)
Sodium: 138 mmol/L (ref 134–144)

## 2019-01-27 ENCOUNTER — Telehealth: Payer: Self-pay | Admitting: *Deleted

## 2019-01-27 DIAGNOSIS — R944 Abnormal results of kidney function studies: Secondary | ICD-10-CM

## 2019-01-27 NOTE — Telephone Encounter (Signed)
-----   Message from Leonie Man, MD sent at 01/20/2019  2:19 AM EDT ----- Chemistry panel shows mild bump in renal function and a little bit drop in potassium.  Recommend that she maybe eats more bananas and tomatoes for more potassium.  Must recheck a BMP in about a month.  Glenetta Hew, MD

## 2019-01-27 NOTE — Telephone Encounter (Signed)
LEFT MESSAGE TO CALL BACK - DISCUSS LAB RESULTS LABSLIP NEEDS TO BE MAILED

## 2019-01-28 ENCOUNTER — Encounter: Payer: Self-pay | Admitting: *Deleted

## 2019-01-28 NOTE — Progress Notes (Signed)
LETTER SENT  WITH LABSLIP FOR ONE MONTH BMP

## 2019-01-28 NOTE — Telephone Encounter (Signed)
PATIENT RETURN THE CALL  EARLIER.  RN RETURNING CALL -  HAD TO LEAVE DETAILED MESSAGE OF RESULTS AND INSTRUCTIONS FOR LAB WORK. ANY QUESTION MAY CALL BACK

## 2019-01-29 NOTE — Telephone Encounter (Signed)
Spoke to patient . She received information by  Voicemail . RN ALSO RESTATED INFORMATION - LAB. PATIENT IS AWARE TO HAVE LABS REDRAWN IN MONTH -  MAILED SLIPS

## 2019-01-30 DIAGNOSIS — Z23 Encounter for immunization: Secondary | ICD-10-CM | POA: Diagnosis not present

## 2019-01-30 DIAGNOSIS — I10 Essential (primary) hypertension: Secondary | ICD-10-CM | POA: Diagnosis not present

## 2019-01-30 DIAGNOSIS — E1169 Type 2 diabetes mellitus with other specified complication: Secondary | ICD-10-CM | POA: Diagnosis not present

## 2019-03-05 ENCOUNTER — Other Ambulatory Visit: Payer: Self-pay | Admitting: Cardiology

## 2019-04-05 ENCOUNTER — Other Ambulatory Visit: Payer: Self-pay | Admitting: Cardiology

## 2019-04-08 DIAGNOSIS — E1169 Type 2 diabetes mellitus with other specified complication: Secondary | ICD-10-CM | POA: Diagnosis not present

## 2019-04-08 DIAGNOSIS — G479 Sleep disorder, unspecified: Secondary | ICD-10-CM | POA: Diagnosis not present

## 2019-04-08 DIAGNOSIS — I259 Chronic ischemic heart disease, unspecified: Secondary | ICD-10-CM | POA: Diagnosis not present

## 2019-04-08 DIAGNOSIS — I10 Essential (primary) hypertension: Secondary | ICD-10-CM | POA: Diagnosis not present

## 2019-04-24 DIAGNOSIS — E1169 Type 2 diabetes mellitus with other specified complication: Secondary | ICD-10-CM | POA: Diagnosis not present

## 2019-04-24 DIAGNOSIS — I10 Essential (primary) hypertension: Secondary | ICD-10-CM | POA: Diagnosis not present

## 2019-04-24 DIAGNOSIS — Z0000 Encounter for general adult medical examination without abnormal findings: Secondary | ICD-10-CM | POA: Diagnosis not present

## 2019-06-13 ENCOUNTER — Ambulatory Visit: Payer: Medicare Other

## 2019-06-14 ENCOUNTER — Ambulatory Visit: Payer: Medicare Other | Attending: Internal Medicine

## 2019-06-14 DIAGNOSIS — Z23 Encounter for immunization: Secondary | ICD-10-CM

## 2019-06-14 NOTE — Progress Notes (Signed)
   Covid-19 Vaccination Clinic  Name:  Phyllis Conner    MRN: [id number] DOB: 1946/01/13  06/14/2019  [name] was observed post Covid-19 immunization for 15 minutes without incident. She was provided with Vaccine Information Sheet and instruction to access the V-Safe system.   [name] was instructed to call 911 with any severe reactions post vaccine: Marland Kitchen Difficulty breathing  . Swelling of face and throat  . A fast heartbeat  . A bad rash all over body  . Dizziness and weakness   Immunizations Administered    Name Date Dose VIS Date Route   Moderna COVID-19 Vaccine 06/14/2019  8:28 AM 0.5 mL 03/11/2019 Intramuscular   Manufacturer: Moderna   Lot: [id number]   [id number]

## 2019-06-26 DIAGNOSIS — I119 Hypertensive heart disease without heart failure: Secondary | ICD-10-CM | POA: Diagnosis not present

## 2019-06-26 DIAGNOSIS — E1169 Type 2 diabetes mellitus with other specified complication: Secondary | ICD-10-CM | POA: Diagnosis not present

## 2019-06-26 DIAGNOSIS — M150 Primary generalized (osteo)arthritis: Secondary | ICD-10-CM | POA: Diagnosis not present

## 2019-06-26 DIAGNOSIS — E785 Hyperlipidemia, unspecified: Secondary | ICD-10-CM | POA: Diagnosis not present

## 2019-07-16 ENCOUNTER — Ambulatory Visit: Payer: Medicare Other | Attending: Internal Medicine

## 2019-07-16 DIAGNOSIS — Z23 Encounter for immunization: Secondary | ICD-10-CM

## 2019-07-16 NOTE — Progress Notes (Signed)
   Covid-19 Vaccination Clinic  Name:  KEYANAH OVER    MRN: [id number] DOB: [birth year]  07/16/2019  Ms. Rockman was observed post Covid-19 immunization for 15 minutes without incident. She was provided with Vaccine Information Sheet and instruction to access the V-Safe system.   Ms. Striplin was instructed to call 911 with any severe reactions post vaccine: Marland Kitchen Difficulty breathing  . Swelling of face and throat  . A fast heartbeat  . A bad rash all over body  . Dizziness and weakness   Immunizations Administered    Name Date Dose VIS Date Route   Moderna COVID-19 Vaccine 07/16/2019  8:43 AM 0.5 mL 03/11/2019 Intramuscular   Manufacturer: Levan Hurst   LotUD:[id number]   [place]:[id number]

## 2019-08-14 DIAGNOSIS — Z471 Aftercare following joint replacement surgery: Secondary | ICD-10-CM | POA: Diagnosis not present

## 2019-08-14 DIAGNOSIS — Z96642 Presence of left artificial hip joint: Secondary | ICD-10-CM | POA: Diagnosis not present

## 2019-09-15 ENCOUNTER — Other Ambulatory Visit: Payer: Self-pay | Admitting: Cardiology

## 2019-09-23 ENCOUNTER — Other Ambulatory Visit (HOSPITAL_COMMUNITY): Payer: Self-pay | Admitting: Family Medicine

## 2019-09-23 DIAGNOSIS — Z1231 Encounter for screening mammogram for malignant neoplasm of breast: Secondary | ICD-10-CM

## 2019-09-26 DIAGNOSIS — I10 Essential (primary) hypertension: Secondary | ICD-10-CM | POA: Diagnosis not present

## 2019-09-26 DIAGNOSIS — E1169 Type 2 diabetes mellitus with other specified complication: Secondary | ICD-10-CM | POA: Diagnosis not present

## 2019-09-26 DIAGNOSIS — E785 Hyperlipidemia, unspecified: Secondary | ICD-10-CM | POA: Diagnosis not present

## 2019-10-20 ENCOUNTER — Ambulatory Visit (HOSPITAL_COMMUNITY)
Admission: RE | Admit: 2019-10-20 | Discharge: 2019-10-20 | Disposition: A | Discharge status: Home / Self Care | Payer: Medicare Other | Source: Ambulatory Visit | Attending: Family Medicine | Admitting: Family Medicine

## 2019-10-20 ENCOUNTER — Other Ambulatory Visit: Payer: Self-pay

## 2019-10-20 DIAGNOSIS — Z1231 Encounter for screening mammogram for malignant neoplasm of breast: Secondary | ICD-10-CM | POA: Insufficient documentation

## 2019-11-28 DIAGNOSIS — E1169 Type 2 diabetes mellitus with other specified complication: Secondary | ICD-10-CM | POA: Diagnosis not present

## 2019-11-28 DIAGNOSIS — I10 Essential (primary) hypertension: Secondary | ICD-10-CM | POA: Diagnosis not present

## 2019-11-28 DIAGNOSIS — E785 Hyperlipidemia, unspecified: Secondary | ICD-10-CM | POA: Diagnosis not present

## 2019-12-10 ENCOUNTER — Other Ambulatory Visit: Payer: Self-pay | Admitting: Cardiology

## 2020-01-07 ENCOUNTER — Ambulatory Visit: Payer: Medicare Other | Admitting: Cardiology

## 2020-02-06 ENCOUNTER — Other Ambulatory Visit: Payer: Self-pay | Admitting: Cardiology

## 2020-02-06 DIAGNOSIS — Z23 Encounter for immunization: Secondary | ICD-10-CM | POA: Diagnosis not present

## 2020-02-06 DIAGNOSIS — E1169 Type 2 diabetes mellitus with other specified complication: Secondary | ICD-10-CM | POA: Diagnosis not present

## 2020-02-06 DIAGNOSIS — I10 Essential (primary) hypertension: Secondary | ICD-10-CM | POA: Diagnosis not present

## 2020-02-20 ENCOUNTER — Other Ambulatory Visit: Payer: Self-pay

## 2020-02-20 ENCOUNTER — Ambulatory Visit (INDEPENDENT_AMBULATORY_CARE_PROVIDER_SITE_OTHER): Payer: Medicare Other | Admitting: Cardiology

## 2020-02-20 VITALS — BP 138/78 | HR 62 | Ht 65.0 in | Wt 227.4 lb

## 2020-02-20 DIAGNOSIS — I2510 Atherosclerotic heart disease of native coronary artery without angina pectoris: Secondary | ICD-10-CM | POA: Diagnosis not present

## 2020-02-20 DIAGNOSIS — Z9861 Coronary angioplasty status: Secondary | ICD-10-CM

## 2020-02-20 DIAGNOSIS — E785 Hyperlipidemia, unspecified: Secondary | ICD-10-CM

## 2020-02-20 DIAGNOSIS — I10 Essential (primary) hypertension: Secondary | ICD-10-CM

## 2020-02-20 DIAGNOSIS — I214 Non-ST elevation (NSTEMI) myocardial infarction: Secondary | ICD-10-CM

## 2020-02-20 DIAGNOSIS — I350 Nonrheumatic aortic (valve) stenosis: Secondary | ICD-10-CM | POA: Diagnosis not present

## 2020-02-20 DIAGNOSIS — E669 Obesity, unspecified: Secondary | ICD-10-CM

## 2020-02-20 DIAGNOSIS — E1169 Type 2 diabetes mellitus with other specified complication: Secondary | ICD-10-CM | POA: Diagnosis not present

## 2020-02-20 MED ORDER — ROSUVASTATIN CALCIUM 40 MG PO TABS: 40.0000 mg | ORAL_TABLET | Freq: Every day | ORAL | 3 refills | Status: DC

## 2020-02-20 MED ORDER — LOSARTAN POTASSIUM 25 MG PO TABS: 25.0000 mg | ORAL_TABLET | Freq: Every day | ORAL | 3 refills | Status: DC

## 2020-02-20 NOTE — Patient Instructions (Addendum)
Medication Instructions:   STOP TAKING ATORVASTATIN    START TAKING ROSUVASTATIN 40 MG ONE TABLET AT BEDTIME DAILY- FOR CHOLESTEROL   START TAKING LOSARTAN 25 MG ONE TABLET DAILY   *If you need a refill on your cardiac medications before your next appointment, please call your pharmacy*   Lab Work: CMP IN ONE WEEK AFTER STARTING LOSARTAN   YOU CAN GO T THE [facility] OR COME TO THE OFFICE - INSTRUCTION ARE ON THE ORANGE PAPER STAPLED TO LAB SLIP    JAN 2022 PLEASE HAVE LABS - FASTING LIPID CMP BEFORE YOU SEE [facility] - PHARMACIST HERE AT THE OFFICE TO CHECK CHOLESTEROL    If you have labs (blood work) drawn today and your tests are completely normal, you will receive your results only by: Marland Kitchen MyChart Message (if you have MyChart) OR . A paper copy in the mail If you have any lab test that is abnormal or we need to change your treatment, we will call you to review the results.   Testing/Procedures:  NOT NEEDED  Follow-Up: At [facility], you and your health needs are our priority.  As part of our continuing mission to provide you with exceptional heart care, we have created designated Provider Care Teams.  These Care Teams include your primary Cardiologist (physician) and Advanced Practice Providers (APPs -  Physician Assistants and Nurse Practitioners) who all work together to provide you with the care you need, when you need it.  We recommend signing up for the patient portal called "MyChart".  Sign up information is provided on this After Visit Summary.  MyChart is used to connect with patients for Virtual Visits (Telemedicine).  Patients are able to view lab/test results, encounter notes, upcoming appointments, etc.  Non-urgent messages can be sent to your provider as well.   To learn more about what you can do with MyChart, go to [email or web address].    Your next appointment:   6 month(s)  The format for your next appointment:   In Person  Provider:   Glenetta Hew, MD   Other Instructions You have been referred to  [facility]22

## 2020-02-20 NOTE — Progress Notes (Signed)
Primary Care Provider: Lucianne Lei, MD Cardiologist: Glenetta Hew, MD Electrophysiologist: None  Clinic Note: Chief Complaint  Patient presents with  . Follow-up    Annual  . Coronary Artery Disease    No angina   HPI:    Phyllis Conner is a 74 y.o. female with a PMH notable for CAD having PCI (non-STEMI November 2014) with DM-2, HTN and HLD, and OBESITY (METABOLIC SYNDROME) who presents today for annual follow-up.  Problem List Items Addressed This Visit    CAD S/P percutaneous coronary angioplasty -- LAD/OM1 DES 02/17/13 - Primary (Chronic)    History of two-vessel PCI he does have classic anginal symptoms.  She has some musculoskeletal type chest pain but nothing like her MI.  Now 7 years post.  Plan: Continue amlodipine, metoprolol, Imdur along with statin and Brilinta.  Convert from atorvastatin to rosuvastatin  She is now on maintenance dose Brilinta monotherapy.  We talked about potentially switching her over to aspirin, but she prefers to stay on [place] can be held 5 days preop for surgeries or procedures.      Relevant Medications   rosuvastatin (CRESTOR) 40 MG tablet   losartan (COZAAR) 25 MG tablet   Other Relevant Orders   EKG 12-Lead (Completed)   Comprehensive metabolic panel   Lipid panel   Comprehensive metabolic panel   Obesity (BMI 30-39.9) (Chronic)    Multiple risk factors reviewed metabolic syndrome criteria, she would be considered morbidly obese with a BMI greater than 35.Needs to adjust her diet and increase exercise.  The hope was that following her hip surgery she would become more mobile.      Hyperlipidemia associated with type 2 diabetes mellitus ([place]) (Chronic)    She never did follow-up with our lipid clinic.  Target LDL is less than 70, closer 50.  Not at goal.  Plan: We will convert from atorvastatin to rosuvastatin 40 mg and recheck labs in January.  She will then follow-up with our clinical pharmacist in the  [facility] ([facility] Lipid [facility]  She is on insulin for diabetes, would consider the possibility of GLP-1 agonist such as Ozempic which would also help with weight loss and cardiovascular event..      Relevant Medications   rosuvastatin (CRESTOR) 40 MG tablet   losartan (COZAAR) 25 MG tablet   Other Relevant Orders   Comprehensive metabolic panel   Lipid panel   Comprehensive metabolic panel   AMB Referral to [facility] Pharm-D   Mild aortic stenosis by prior echocardiogram (Chronic)    Please stable exam finding.  Mild as of June 2019.  Would be due to follow-up next year.  We can order a 87-monthfollow-up.      Relevant Medications   rosuvastatin (CRESTOR) 40 MG tablet   losartan (COZAAR) 25 MG tablet   NSTEMI (non-ST elevated myocardial infarction) (HCC) (Chronic)    She is now 7 years out from her MI.  No recurrent angina or heart failure symptoms.  Preserved EF on echo.  No obvious wall motion normality.      Relevant Medications   rosuvastatin (CRESTOR) 40 MG tablet   losartan (COZAAR) 25 MG tablet   Other Relevant Orders   EKG 12-Lead (Completed)   Comprehensive metabolic panel   Lipid panel   Comprehensive metabolic panel   Essential hypertension (Chronic)    Borderline blood pressure today.  She tells me that this is usually the average for her.  As such, I would  like her to be a little more controlled.  Plan: Continue HCTZ and low-dose metoprolol along with high-dose amlodipine, but will add losartan 25 mg daily. We will need to check chemistry panel roughly 1 week after starting. We will then recheck CMP with lipids in January      Relevant Medications   rosuvastatin (CRESTOR) 40 MG tablet   losartan (COZAAR) 25 MG tablet   Other Relevant Orders   Comprehensive metabolic panel   Lipid panel   Comprehensive metabolic panel   Diabetes mellitus type 2 in obese (HCC)   Relevant Medications   rosuvastatin (CRESTOR) 40 MG tablet    losartan (COZAAR) 25 MG tablet     [name] was last seen on January 08, 2019 for delayed annual follow-up in delayed for hip surgery.  Very intermittent off-and-on chest discomfort with walking that was short-lived.  She is have been going to gym and exercising partly because of her surgery and COVID-19 restrictions and she gained a significant bit of weight.  Was trying to lose it back.  Mild edema.  Occasional palpitations/rapid heart rates.  Recent Hospitalizations: n/a   Reviewed  CV studies:    The following studies were reviewed today: (if available, images/films reviewed: From Epic Chart or Care Everywhere) . n/a: None Interval History:   Blenda S Finder returns today for annual follow-up doing pretty well overall from cardiac standpoint.  She has her stable off and on musculoskeletal type sharp pinching chest discomfort with certain movements.  Not with exertion.  She also notes off-and-on mild lower extremity/pedal edema.  She is a little immobile now in the [place] she gets from her surgery.  Still not as active as she would like to be though.  CV Review of Symptoms (Summary): positive for - chest pain, edema, palpitations and Exertional dyspnea improving the more exercise she does. negative for - irregular heartbeat, orthopnea, paroxysmal nocturnal dyspnea, rapid heart rate, shortness of breath or Syncope/near syncope or TIA/amaurosis fugax, claudication  The patient does not have symptoms concerning for COVID-19 infection (fever, chills, cough, or new shortness of breath).   REVIEWED OF SYSTEMS   Pertinent symptoms noted above, other Positive Review of Systems as follows:  Baseline dyspnea  Back and knee pain.  Mobility improved some since her hip surgery, but not completely.  Some dizziness.  Otherwise review of symptoms negative if not noted above.  I have reviewed and (if needed) personally updated the patient's problem list, medications, allergies, past  medical and surgical history, social and family history.   PAST MEDICAL HISTORY   Past Medical History:  Diagnosis Date  . Acid reflux   . Anemia    "when I was younger"  . Aortic stenosis    mild AS 09/14/17 echo  . Arthritis   . CAD S/P percutaneous coronary angioplasty November 2014   Mid LAD 2.5 mm x 12 mm Promus DES (2.70 mm); proximal OM1 - Promus DES 2.75 mm x 16 mm (2.85 mm)  . Diabetes mellitus type 2 in obese ([place])   . Essential hypertension   . Headache   . Hyperlipidemia with target LDL less than 70   . Non-Q wave ST elevation myocardial infarction (STEMI) involving left anterior descending (LAD) coronary artery November 2014   PCI to LAD and circumflex  . Obesity (BMI 30-39.9) 05/05/2013    PAST SURGICAL HISTORY   Past Surgical History:  Procedure Laterality Date  . ABDOMINAL HYSTERECTOMY  1990s  . Arthroscopic knee surgery Bilateral  1984, 2002   Arthroscopic  . CARDIAC CATHETERIZATION  November 2014   Mid LAD 95%, proximal OM1 80-90% --> two-vessel PCI  . COLONOSCOPY N/A 12/05/2013   Procedure: COLONOSCOPY;  Surgeon: Beryle Beams, MD;  Location: [facility];  Service: Endoscopy;  Laterality: N/A;  . COLONOSCOPY WITH PROPOFOL N/A 12/11/2014   Procedure: COLONOSCOPY WITH PROPOFOL;  Surgeon: Carol Ada, MD;  Location: [facility];  Service: Endoscopy;  Laterality: N/A;  . ESOPHAGOGASTRODUODENOSCOPY N/A 12/05/2013   Procedure: ESOPHAGOGASTRODUODENOSCOPY (EGD);  Surgeon: Beryle Beams, MD;  Location: [facility];  Service: Endoscopy;  Laterality: N/A;  . LEFT HEART CATHETERIZATION WITH CORONARY ANGIOGRAM N/A 02/17/2013   Procedure: LEFT HEART CATHETERIZATION WITH CORONARY ANGIOGRAM;  Surgeon: Sanda Klein, MD;  Location: [facility];  Service: Cardiovascular;  Laterality: N/A;  . PERCUTANEOUS CORONARY STENT INTERVENTION (PCI-S)  November 2014   Mid LAD 2.5 mm x 12 mm (2.75 mm) Promus DES; OM1 2.75 mm x 60 mm (2.85 mm) Promus DES  . TOTAL HIP ARTHROPLASTY Left  10/17/2017  . TOTAL HIP ARTHROPLASTY Left 10/17/2017   Procedure: TOTAL HIP ARTHROPLASTY ANTERIOR APPROACH;  Surgeon: [name], MD;  Location: [facility];  Service: Orthopedics;  Laterality: Left;  . TRANSTHORACIC ECHOCARDIOGRAM  09/2017    Normal EF 55-60%. No RWMA.  Gr 1-2DD.  Mild AS. (mean gradient 21 mmHg)     Immunization History  Administered Date(s) Administered  . Influenza,inj,Quad PF,6+ Mos 02/18/2013  . Influenza-Unspecified 02/08/2018  . Moderna SARS-COVID-2 Vaccination 06/14/2019, 07/16/2019    MEDICATIONS/ALLERGIES   Current Meds  Medication Sig  . ACCU-CHEK AVIVA PLUS test strip 1 each by Other route 2 (two) times daily.   Marland Kitchen amLODipine (NORVASC) 10 MG tablet TAKE 1 TABLET BY MOUTH EVERY DAY  . HUMALOG 100 UNIT/ML injection Inject 20 Units into the skin continuous.   . hydrochlorothiazide (HYDRODIURIL) 25 MG tablet TAKE 1 TABLET(25 MG) BY MOUTH DAILY  . Insulin Disposable Pump (V-GO 20) KIT Humalog  . isosorbide mononitrate (IMDUR) 30 MG 24 hr tablet TAKE 1 TABLET(30 MG) BY MOUTH DAILY  . Menthol, Topical Analgesic, (ICY HOT MEDICATED SPRAY EX) Apply 1 spray topically daily as needed (pain).  . metFORMIN (GLUCOPHAGE) 500 MG tablet Take 1 tablet (500 mg total) by mouth daily.  . metoprolol tartrate (LOPRESSOR) 25 MG tablet TAKE 1 TABLET BY MOUTH TWICE DAILY  . nitroGLYCERIN (NITROSTAT) 0.4 MG SL tablet Place 1 tablet (0.4 mg total) under the tongue every 5 (five) minutes x 3 doses as needed for chest pain.  . potassium chloride SA (K-DUR,KLOR-CON) 20 MEQ tablet Take 20 mEq by mouth daily.   . traMADol (ULTRAM) 50 MG tablet TK 1 T PO BID  . [DISCONTINUED] atorvastatin (LIPITOR) 40 MG tablet Take 1 tablet (40 mg total) by mouth daily.    Allergies  Allergen Reactions  . Iohexol Hives and Other (See Comments)    Excessive sweating    SOCIAL HISTORY/FAMILY HISTORY   Reviewed in Epic:  Pertinent findings: No new changes  OBJCTIVE -PE, EKG, labs   Wt Readings from  Last 3 Encounters:  02/20/20 227 lb 6.4 oz (103.1 kg)  01/08/19 229 lb 6.4 oz (104.1 kg)  10/17/17 233 lb (105.7 kg)    Physical Exam: BP 138/78   Pulse 62   Ht 5' 5"  (1.651 m)   Wt 227 lb 6.4 oz (103.1 kg)   BMI 37.84 kg/m  Physical Exam Vitals reviewed.  Constitutional:      General: She is not in  acute distress.    Appearance: Normal appearance. She is obese. She is not ill-appearing or toxic-appearing.     Comments: Borderline morbidly obese.  Well-groomed.  HENT:     Head: Normocephalic and atraumatic.  Neck:     Vascular: Carotid bruit (Soft right-sided bruit versus radiated aortic murmur.) present. No hepatojugular reflux or JVD.  Cardiovascular:     Rate and Rhythm: Normal rate and regular rhythm.  No extrasystoles are present.    Chest Wall: PMI is not displaced (Unable to palpate).     Pulses: Normal pulses.     Heart sounds: S1 normal and S2 normal. Heart sounds are distant. Murmur heard. High-pitched harsh crescendo-decrescendo early systolic murmur is present with a grade of 2/6 at the upper right sternal border radiating to the neck.  No friction rub. No gallop.   Pulmonary:     Effort: Pulmonary effort is normal. No respiratory distress.     Breath sounds: Normal breath sounds.  Chest:     Chest wall: No tenderness.  Musculoskeletal:        General: Swelling (Trivial bilateral) present. Normal range of motion.     Cervical back: Normal range of motion and neck supple.  Neurological:     General: No focal deficit present.     Mental Status: She is alert and oriented to person, place, and time. Mental status is at baseline.     Gait: Gait abnormal (Just slow and mildly antalgic).  Psychiatric:        Mood and Affect: Mood normal.        Behavior: Behavior normal.        Thought Content: Thought content normal.        Judgment: Judgment normal.   Agents  Adult ECG Report  Rate: 62 ;  Rhythm: normal sinus rhythm and Borderline low voltage.  Otherwise  normal axis, intervals and durations.;   Narrative Interpretation: Normal EKG.  Recent Labs-February 06, 2019:  TC 190, TG 177, HDL 58, LDL 103.  A1c 9.3, Hgb 11.4, Cr 1.36, K+ 3.7 Lab Results  Component Value Date   CHOL 156 08/18/2013   HDL 50 08/18/2013   LDLCALC 88 08/18/2013   TRIG 88 08/18/2013   CHOLHDL 3.1 08/18/2013   Lab Results  Component Value Date   CREATININE 1.36 (H) 01/16/2019   BUN 24 01/16/2019   NA 138 01/16/2019   K 3.6 01/16/2019   CL 99 01/16/2019   CO2 25 01/16/2019   Lab Results  Component Value Date   TSH 4.393 02/15/2013    ASSESSMENT/PLAN    Problem List Items Addressed This Visit    CAD S/P percutaneous coronary angioplasty -- LAD/OM1 DES 02/17/13 - Primary (Chronic)    History of two-vessel PCI he does have classic anginal symptoms.  She has some musculoskeletal type chest pain but nothing like her MI.  Now 7 years post.  Plan: Continue amlodipine, metoprolol, Imdur along with statin and Brilinta.  Convert from atorvastatin to rosuvastatin  She is now on maintenance dose Brilinta monotherapy.  We talked about potentially switching her over to aspirin, but she prefers to stay on [place] can be held 5 days preop for surgeries or procedures.      Relevant Medications   rosuvastatin (CRESTOR) 40 MG tablet   losartan (COZAAR) 25 MG tablet   Other Relevant Orders   EKG 12-Lead (Completed)   Comprehensive metabolic panel   Lipid panel   Comprehensive metabolic panel   Obesity (  BMI 30-39.9) (Chronic)    Multiple risk factors reviewed metabolic syndrome criteria, she would be considered morbidly obese with a BMI greater than 35.Needs to adjust her diet and increase exercise.  The hope was that following her hip surgery she would become more mobile.      Hyperlipidemia associated with type 2 diabetes mellitus ([place]) (Chronic)    She never did follow-up with our lipid clinic.  Target LDL is less than 70, closer 50.  Not at  goal.  Plan: We will convert from atorvastatin to rosuvastatin 40 mg and recheck labs in January.  She will then follow-up with our clinical pharmacist in the [facility] (Card[facility]) Lipid [facility]  She is on insulin for diabetes, would consider the possibility of GLP-1 agonist such as Ozempic which would also help with weight loss and cardiovascular event..      Relevant Medications   rosuvastatin (CRESTOR) 40 MG tablet   losartan (COZAAR) 25 MG tablet   Other Relevant Orders   Comprehensive metabolic panel   Lipid panel   Comprehensive metabolic panel   AMB Referral to [facility] Pharm-[facility]   Mild aortic stenosis by prior echocardiogram (Chronic)    Please stable exam finding.  Mild as of June 2019.  Would be due to follow-up next year.  We can order a 15-monthfollow-up.      Relevant Medications   rosuvastatin (CRESTOR) 40 MG tablet   losartan (COZAAR) 25 MG tablet   NSTEMI (non-ST elevated myocardial infarction) (HCC) (Chronic)    She is now 7 years out from her MI.  No recurrent angina or heart failure symptoms.  Preserved EF on echo.  No obvious wall motion normality.      Relevant Medications   rosuvastatin (CRESTOR) 40 MG tablet   losartan (COZAAR) 25 MG tablet   Other Relevant Orders   EKG 12-Lead (Completed)   Comprehensive metabolic panel   Lipid panel   Comprehensive metabolic panel   Essential hypertension (Chronic)    Borderline blood pressure today.  She tells me that this is usually the average for her.  As such, I would like her to be a little more controlled.  Plan: Continue HCTZ and low-dose metoprolol along with high-dose amlodipine, but will add losartan 25 mg daily. We will need to check chemistry panel roughly 1 week after starting. We will then recheck CMP with lipids in January      Relevant Medications   rosuvastatin (CRESTOR) 40 MG tablet   losartan (COZAAR) 25 MG tablet   Other Relevant Orders   Comprehensive metabolic  panel   Lipid panel   Comprehensive metabolic panel   Diabetes mellitus type 2 in obese (HCC)   Relevant Medications   rosuvastatin (CRESTOR) 40 MG tablet   losartan (COZAAR) 25 MG tablet    35% Blood count today COVID-19 Education: The signs and symptoms of COVID-19 were discussed with the patient and how to seek care for testing (follow up with PCP or arrange E-visit).   The importance of social distancing and COVID-19 vaccination was discussed today.  11 min The patient is practicing social distancing & Masking.   I spent a total of 268mutes with the patient spent in direct patient consultation.  Additional time spent with chart review  / charting (studies, outside notes, etc): 12 Total Time: 32 min   Current medicines are reviewed at length with the patient today.  (+/- concerns) n/a options and get labs  This visit occurred during the SARS-CoV-2  public health emergency.  Safety protocols were in place, including screening questions prior to the visit, additional usage of staff PPE, and extensive cleaning of exam room while observing appropriate contact time as indicated for disinfecting solutions.  Notice: This dictation was prepared with Dragon dictation along with smaller phrase technology. Any transcriptional errors that result from this process are unintentional and may not be corrected upon review.  Patient Instructions / Medication Changes & Studies & Tests Ordered   Patient Instructions  Medication Instructions:   STOP TAKING ATORVASTATIN    START TAKING ROSUVASTATIN 40 MG ONE TABLET AT BEDTIME DAILY- FOR CHOLESTEROL   START TAKING LOSARTAN 25 MG ONE TABLET DAILY   *If you need a refill on your cardiac medications before your next appointment, please call your pharmacy*   Lab Work: CMP IN ONE WEEK AFTER STARTING LOSARTAN   YOU CAN GO T THE [facility] OR COME TO THE OFFICE - INSTRUCTION ARE ON THE ORANGE PAPER STAPLED TO LAB SLIP    JAN 2022 PLEASE HAVE  LABS - FASTING LIPID CMP BEFORE YOU SEE CVRR - PHARMACIST HERE AT THE OFFICE TO CHECK CHOLESTEROL    If you have labs (blood work) drawn today and your tests are completely normal, you will receive your results only by: Marland Kitchen MyChart Message (if you have MyChart) OR . A paper copy in the mail If you have any lab test that is abnormal or we need to change your treatment, we will call you to review the results.   Testing/Procedures:  NOT NEEDED  Follow-Up: At [facility], you and your health needs are our priority.  As part of our continuing mission to provide you with exceptional heart care, we have created designated Provider Care Teams.  These Care Teams include your primary Cardiologist (physician) and Advanced Practice Providers (APPs -  Physician Assistants and Nurse Practitioners) who all work together to provide you with the care you need, when you need it.  We recommend signing up for the patient portal called "MyChart".  Sign up information is provided on this After Visit Summary.  MyChart is used to connect with patients for Virtual Visits (Telemedicine).  Patients are able to view lab/test results, encounter notes, upcoming appointments, etc.  Non-urgent messages can be sent to your provider as well.   To learn more about what you can do with MyChart, go to [email or web address].    Your next appointment:   6 month(s)  The format for your next appointment:   In Person  Provider:   Glenetta Hew, MD   Other Instructions You have been referred to  [facility]  FOR CHOLESTEROL IN JAN 2022     Studies Ordered:   Orders Placed This Encounter  Procedures  . Comprehensive metabolic panel  . Lipid panel  . Comprehensive metabolic panel  . AMB Referral to [facility] Pharm-D  . EKG 12-Lead     Glenetta Hew, M.D., M.S. Interventional Cardiologist   Pager # [phone] Phone # [phone] [address] [place], [place] [address]   Thank you  for choosing [facility] at [facility]!!

## 2020-02-24 ENCOUNTER — Telehealth: Payer: Self-pay | Admitting: Cardiology

## 2020-02-24 NOTE — Telephone Encounter (Signed)
Attempted to call patient. Was told she would call us back.

## 2020-02-24 NOTE — Telephone Encounter (Signed)
Patient called to speak with doctor or nurse about her chlorlestorol. Please call back

## 2020-02-25 NOTE — Telephone Encounter (Signed)
[place] is returning Levada Dy and Christine's call. Please advise.

## 2020-02-25 NOTE — Telephone Encounter (Signed)
Lm to call back ./cy 

## 2020-02-25 NOTE — Telephone Encounter (Signed)
Returned pt's call. It was unclear in the last OV note regarding the reason for the initial call. I reviewed medications and follow up plans with pt. I will forward to Doreene Adas, PA to review further with pt.

## 2020-03-01 NOTE — Telephone Encounter (Signed)
Unable to reach pt or leave a message mailbox is not set up. 

## 2020-03-01 NOTE — Telephone Encounter (Signed)
Patient called to state that she is still waiting on further information in regards to her medications/cholesterol. Please advise   Thank you !

## 2020-03-03 NOTE — Telephone Encounter (Signed)
Left message to call back  

## 2020-03-07 ENCOUNTER — Encounter: Payer: Self-pay | Admitting: Cardiology

## 2020-03-07 DIAGNOSIS — I350 Nonrheumatic aortic (valve) stenosis: Secondary | ICD-10-CM | POA: Insufficient documentation

## 2020-03-07 NOTE — Assessment & Plan Note (Signed)
Please stable exam finding.  Mild as of June 2019.  Would be due to follow-up next year.  We can order a 38-month follow-up.

## 2020-03-07 NOTE — Assessment & Plan Note (Signed)
Multiple risk factors reviewed metabolic syndrome criteria, she would be considered morbidly obese with a BMI greater than 35.Needs to adjust her diet and increase exercise.  The hope was that following her hip surgery she would become more mobile.

## 2020-03-07 NOTE — Assessment & Plan Note (Signed)
She is now 7 years out from her MI.  No recurrent angina or heart failure symptoms.  Preserved EF on echo.  No obvious wall motion normality.

## 2020-03-07 NOTE — Assessment & Plan Note (Signed)
History of two-vessel PCI he does have classic anginal symptoms.  She has some musculoskeletal type chest pain but nothing like her MI.  Now 7 years post.  Plan: Continue amlodipine, metoprolol, Imdur along with statin and Brilinta.  Convert from atorvastatin to rosuvastatin  She is now on maintenance dose Brilinta monotherapy.  We talked about potentially switching her over to aspirin, but she prefers to stay on [place] can be held 5 days preop for surgeries or procedures.

## 2020-03-07 NOTE — Assessment & Plan Note (Addendum)
Borderline blood pressure today.  She tells me that this is usually the average for her.  As such, I would like her to be a little more controlled.  Plan: Continue HCTZ and low-dose metoprolol along with high-dose amlodipine, but will add losartan 25 mg daily. We will need to check chemistry panel roughly 1 week after starting. We will then recheck CMP with lipids in January

## 2020-03-07 NOTE — Assessment & Plan Note (Addendum)
She never did follow-up with our lipid clinic.  Target LDL is less than 70, closer 50.  Not at goal.  Plan: We will convert from atorvastatin to rosuvastatin 40 mg and recheck labs in January.  She will then follow-up with our clinical pharmacist in the [facility] (Cardiovascular Risk Reduction) Lipid Clinic  She is on insulin for diabetes, would consider the possibility of GLP-1 agonist such as Ozempic which would also help with weight loss and cardiovascular event.Marland Kitchen

## 2020-03-08 ENCOUNTER — Other Ambulatory Visit: Payer: Self-pay | Admitting: Cardiology

## 2020-03-17 IMAGING — MG DIGITAL SCREENING BILATERAL MAMMOGRAM WITH TOMO AND CAD
6 of 12 series · 6 of 36 positions shown · non-contrast
Comparison: Previous exam(s).

CLINICAL DATA: Screening.

EXAM:
DIGITAL SCREENING BILATERAL MAMMOGRAM WITH TOMO AND CAD

[L CC synth-2D]
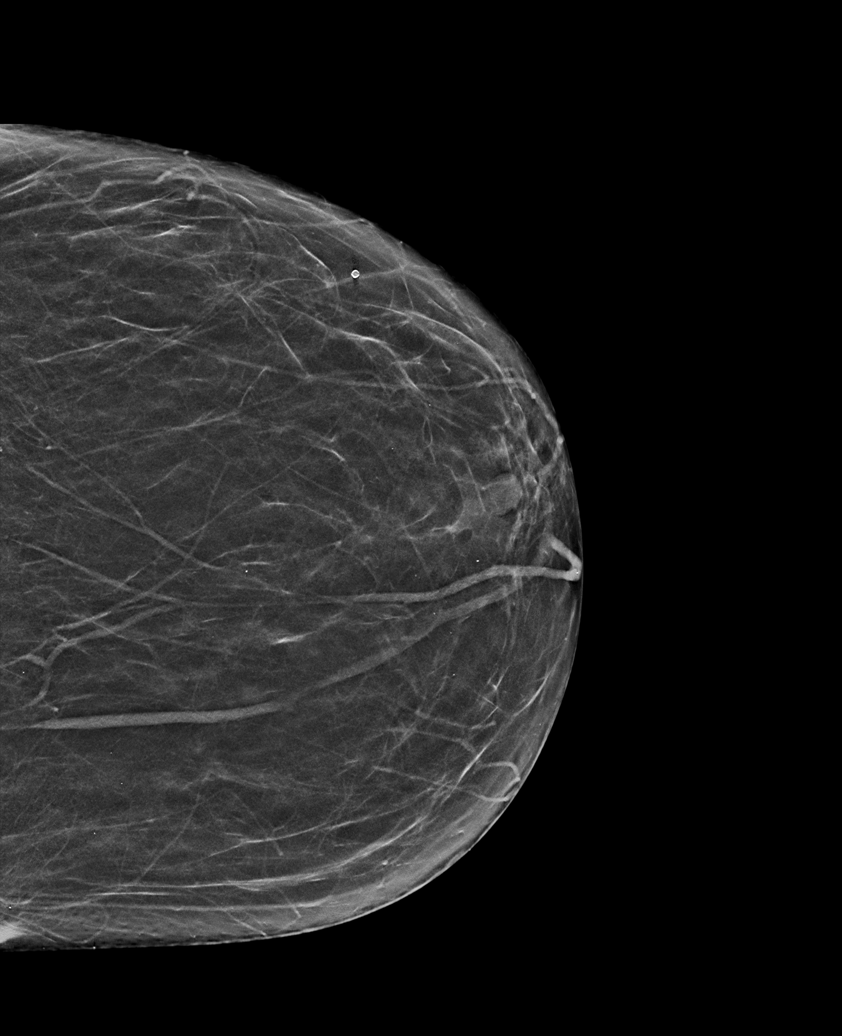

[R MLO synth-2D]
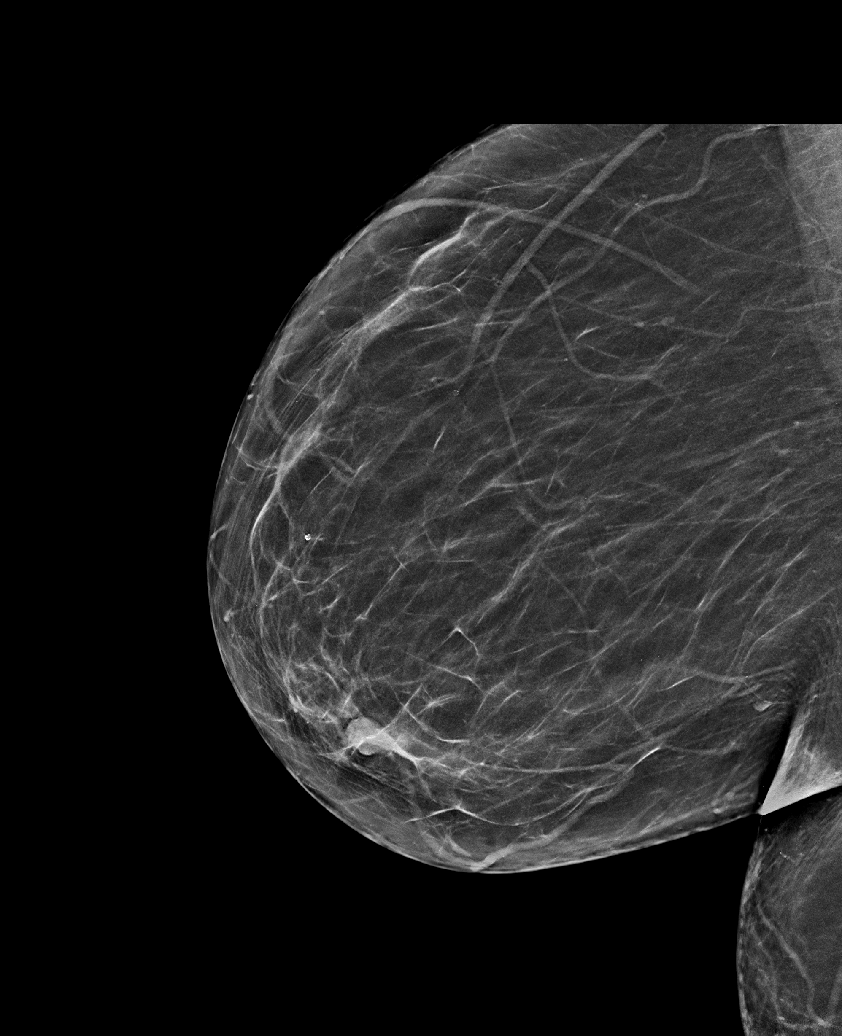

[R CC synth-2D (1 of 2)]
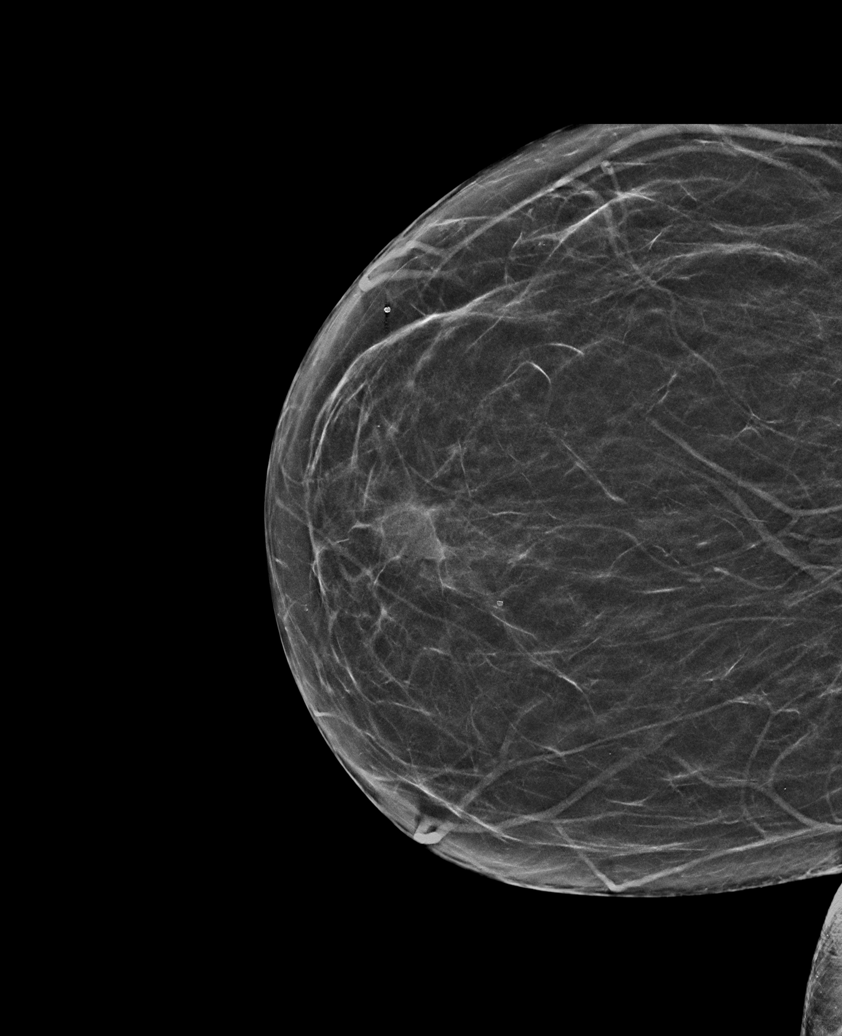

[L MLO synth-2D (1 of 2)]
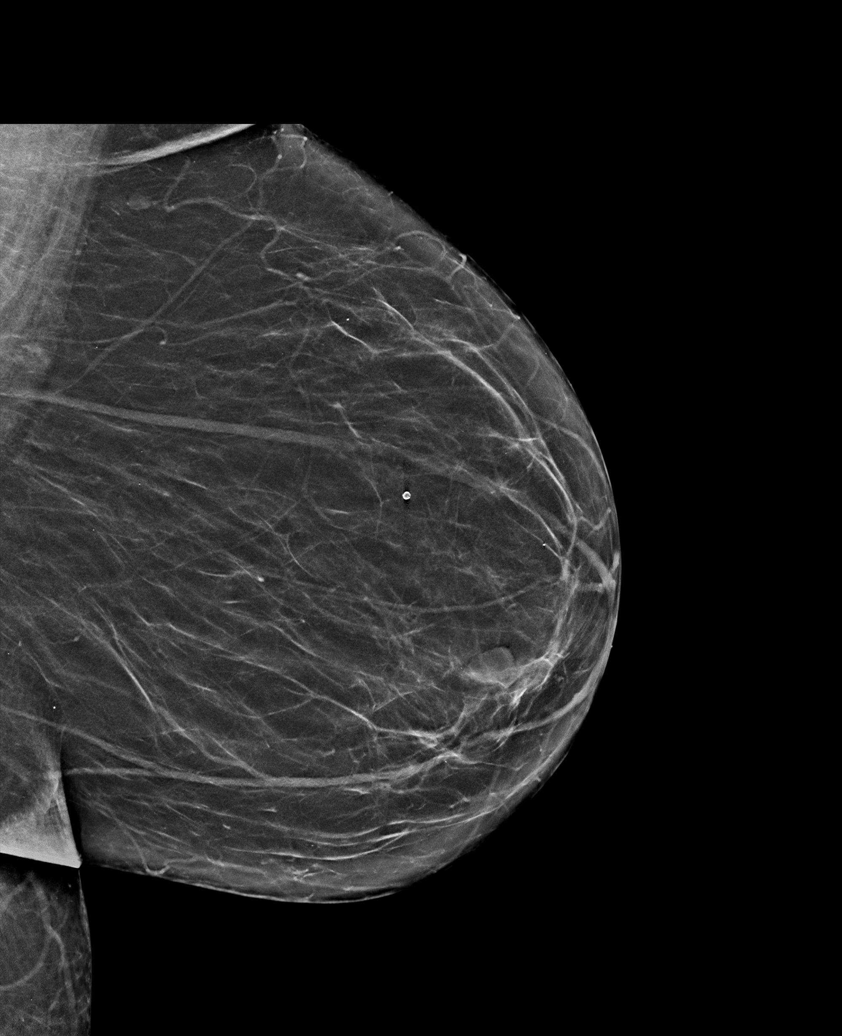

[R CC synth-2D (2 of 2)]
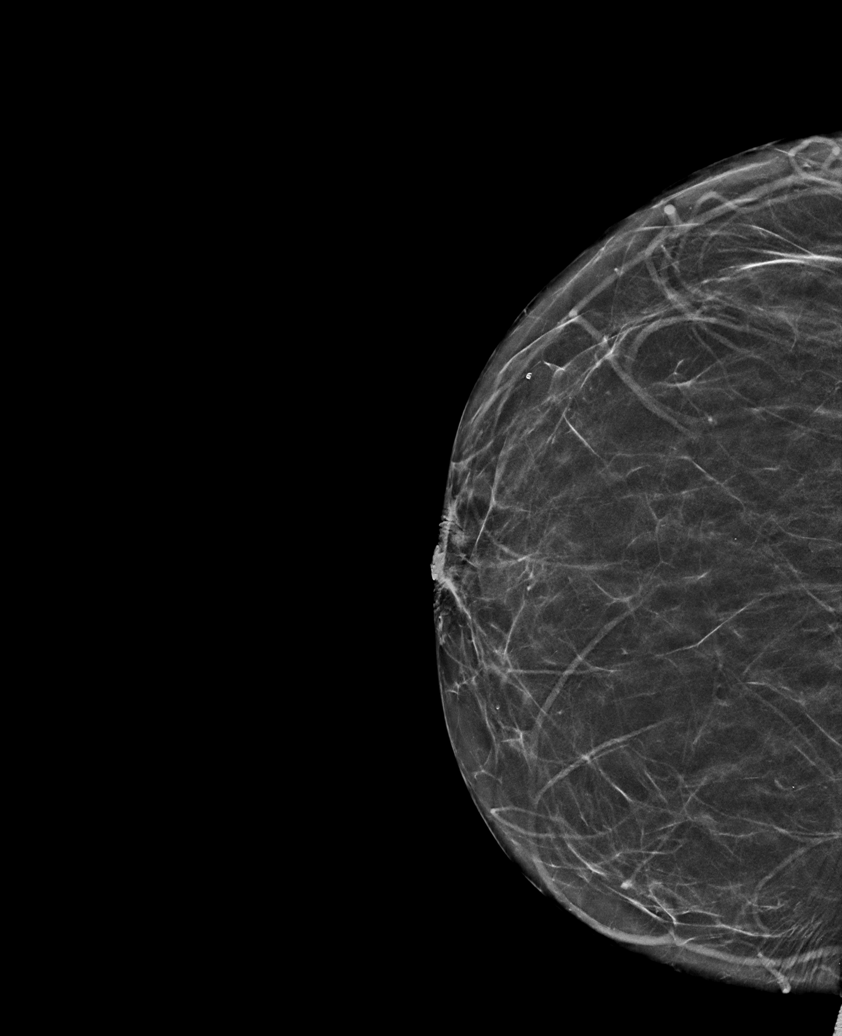

[L MLO synth-2D (2 of 2)]
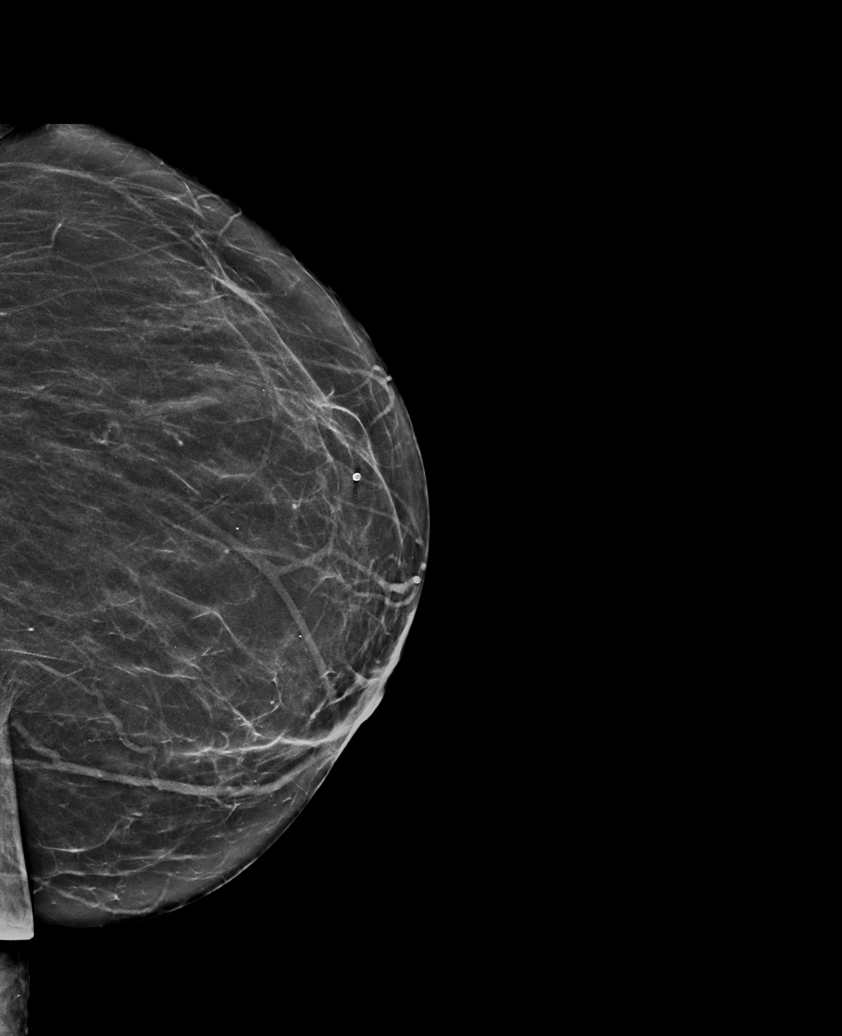

[6 of 36 positions shown; findings below may reference images not displayed]

ACR Breast Density Category b: There are scattered areas of
fibroglandular density.
FINDINGS: There are no findings suspicious for malignancy. Images were
processed with CAD.
IMPRESSION: No mammographic evidence of malignancy. A result letter of this
screening mammogram will be mailed directly to the patient.

RECOMMENDATION:
Screening mammogram in one year. (Code:[id number])

BI-RADS CATEGORY  1: Negative.

## 2020-03-23 ENCOUNTER — Telehealth: Payer: Self-pay | Admitting: Cardiology

## 2020-03-23 NOTE — Telephone Encounter (Signed)
Attempt to return call-no answer and unable to leave VM

## 2020-03-23 NOTE — Telephone Encounter (Signed)
New message:     Patient stating that she was to start a new medication and it has been around 2 weeks. Losartan. please call patient  Concering some medications.

## 2020-03-24 DIAGNOSIS — E119 Type 2 diabetes mellitus without complications: Secondary | ICD-10-CM | POA: Diagnosis not present

## 2020-03-25 NOTE — Telephone Encounter (Signed)
Patient calling back to f/u from call 03/23/20.

## 2020-03-30 NOTE — Telephone Encounter (Signed)
Called to speak with the patient, who states she never received any notification from the pharmacy that her prescriptions for losartan and rosuvastatin were ready. Upon review of the patient's chart, these medications were prescribed at an office visit with Dr. Ellyn Hack on February 20, 2020. Receipt of the new prescriptions was confirmed by [facility] in [place] per the chart. The pt states she has never called the pharmacy directly to follow up on these prescriptions, she was waiting for them to contact her first. RN advised patient that the prescriptions were sent and to please reach out to the pharmacy directly. Advised to call us back if she encounters any difficulty obtaining the medications from this point. The patient verbalizes understanding and agreement with plan.

## 2020-03-30 NOTE — Telephone Encounter (Signed)
Attempted to reach patient. No answer, VM not set up. Closing encounter.

## 2020-04-07 ENCOUNTER — Other Ambulatory Visit: Payer: Self-pay | Admitting: Cardiology

## 2020-04-08 ENCOUNTER — Ambulatory Visit: Payer: Medicare Other

## 2020-04-08 NOTE — Progress Notes (Deleted)
Patient ID: Phyllis Conner                 DOB: Sep 27, 1945                    MRN: [id number]     HPI: Phyllis Conner is a 74 y.o. female patient referred to lipid clinic by Dr Ellyn Hack.Marland Kitchen PMH is significant for CAD, NSTEMI, HLD, HTN, obesity and T2DM  Current Medications: rosuvastatin 68m,  Intolerances: n/a Risk Factors: DM, CAD, HTN, hx of NSTEMI, metabolic syndrome LDL goal: < 55  Diet:   Exercise:   Family History:   Social History:   Labs:LDL 103, VLDL 17, HDL 58, Trigs 177 (02/06/20 - on atorvastatin 80, now on Crestor 40)  Past Medical History:  Diagnosis Date  . Acid reflux   . Anemia    "when I was younger"  . Aortic stenosis    mild AS 09/14/17 echo  . Arthritis   . CAD S/P percutaneous coronary angioplasty November 2014   Mid LAD 2.5 mm x 12 mm Promus DES (2.70 mm); proximal OM1 - Promus DES 2.75 mm x 16 mm (2.85 mm)  . Diabetes mellitus type 2 in obese ([facility]   . Essential hypertension   . Headache   . Hyperlipidemia with target LDL less than 70   . Non-Q wave ST elevation myocardial infarction (STEMI) involving left anterior descending (LAD) coronary artery November 2014   PCI to LAD and circumflex  . Obesity (BMI 30-39.9) 05/05/2013    Current Outpatient Medications on File Prior to Visit  Medication Sig Dispense Refill  . ACCU-CHEK AVIVA PLUS test strip 1 each by Other route 2 (two) times daily.   2  . amLODipine (NORVASC) 10 MG tablet TAKE 1 TABLET BY MOUTH EVERY DAY 90 tablet 3  . BRILINTA 60 MG TABS tablet TAKE 1 TABLET BY MOUTH TWICE DAILY 180 tablet 1  . citalopram (CELEXA) 10 MG tablet Take by mouth.    .Marland KitchenHUMALOG 100 UNIT/ML injection Inject 20 Units into the skin continuous.   0  . hydrochlorothiazide (HYDRODIURIL) 25 MG tablet TAKE 1 TABLET(25 MG) BY MOUTH DAILY 90 tablet 3  . Insulin Disposable Pump (V-GO 20) KIT Humalog  3  . isosorbide mononitrate (IMDUR) 30 MG 24 hr tablet TAKE 1 TABLET(30 MG) BY MOUTH DAILY 60 tablet 1  . losartan (COZAAR) 25  MG tablet Take 1 tablet (25 mg total) by mouth daily. 90 tablet 3  . Menthol, Topical Analgesic, (ICY HOT MEDICATED SPRAY EX) Apply 1 spray topically daily as needed (pain).    . metFORMIN (GLUCOPHAGE) 500 MG tablet Take 1 tablet (500 mg total) by mouth daily.    . metoprolol tartrate (LOPRESSOR) 25 MG tablet TAKE 1 TABLET BY MOUTH TWICE DAILY 180 tablet 3  . nitroGLYCERIN (NITROSTAT) 0.4 MG SL tablet Place 1 tablet (0.4 mg total) under the tongue every 5 (five) minutes x 3 doses as needed for chest pain. 25 tablet 2  . potassium chloride SA (K-DUR,KLOR-CON) 20 MEQ tablet Take 20 mEq by mouth daily.   2  . rosuvastatin (CRESTOR) 40 MG tablet Take 1 tablet (40 mg total) by mouth daily. 90 tablet 3  . traMADol (ULTRAM) 50 MG tablet TK 1 T PO BID     No current facility-administered medications on file prior to visit.    Allergies  Allergen Reactions  . Iohexol Hives and Other (See Comments)    Excessive sweating    Assessment/Plan:  1. Hyperlipidemia -

## 2020-04-11 ENCOUNTER — Other Ambulatory Visit: Payer: Self-pay | Admitting: Cardiology

## 2020-04-26 ENCOUNTER — Telehealth: Payer: Self-pay

## 2020-04-26 NOTE — Telephone Encounter (Signed)
LMOM TO R/S 

## 2020-04-27 ENCOUNTER — Ambulatory Visit: Payer: Medicare Other

## 2020-05-07 DIAGNOSIS — E1169 Type 2 diabetes mellitus with other specified complication: Secondary | ICD-10-CM | POA: Diagnosis not present

## 2020-05-07 DIAGNOSIS — E785 Hyperlipidemia, unspecified: Secondary | ICD-10-CM | POA: Diagnosis not present

## 2020-05-07 DIAGNOSIS — I10 Essential (primary) hypertension: Secondary | ICD-10-CM | POA: Diagnosis not present

## 2020-05-07 DIAGNOSIS — G479 Sleep disorder, unspecified: Secondary | ICD-10-CM | POA: Diagnosis not present

## 2020-05-07 DIAGNOSIS — I252 Old myocardial infarction: Secondary | ICD-10-CM | POA: Diagnosis not present

## 2020-05-10 ENCOUNTER — Ambulatory Visit: Payer: Medicare Other

## 2020-05-10 NOTE — Progress Notes (Deleted)
Patient ID: WAVERLY TARQUINIO                 DOB: 1945/06/21                    MRN: [id number]     HPI: Phyllis Conner is a 75 y.o. female patient referred to lipid clinic by Dr Ellyn Hack. PMH is significant for hypertension, hyperlipidemia, obesity, diabetes, and CAD s/p PCI (NSTEMI - Nov/2014).  Current Medications: Rosuvastatin 53m daily??   Previous therapy:  Atorvastatin 444mdaily Atorvastatin 2036maily  LDL goal: < 70 mg/dL (< 31m68m ideal)  Diet:   Exercise:   Family History:   Social History: denies alcohol and tobacco use  Labs: 05/07/2020: CHO 175, TG 118, HDL 59, LDL-c 95 (rosuvastatin)  Past Medical History:  Diagnosis Date  . Acid reflux   . Anemia    "when I was younger"  . Aortic stenosis    mild AS 09/14/17 echo  . Arthritis   . CAD S/P percutaneous coronary angioplasty November 2014   Mid LAD 2.5 mm x 12 mm Promus DES (2.70 mm); proximal OM1 - Promus DES 2.75 mm x 16 mm (2.85 mm)  . Diabetes mellitus type 2 in obese (HCC)[place]. Essential hypertension   . Headache   . Hyperlipidemia with target LDL less than 70   . Non-Q wave ST elevation myocardial infarction (STEMI) involving left anterior descending (LAD) coronary artery November 2014   PCI to LAD and circumflex  . Obesity (BMI 30-39.9) 05/05/2013    Current Outpatient Medications on File Prior to Visit  Medication Sig Dispense Refill  . ACCU-CHEK AVIVA PLUS test strip 1 each by Other route 2 (two) times daily.   2  . amLODipine (NORVASC) 10 MG tablet TAKE 1 TABLET BY MOUTH EVERY DAY 90 tablet 3  . BRILINTA 60 MG TABS tablet TAKE 1 TABLET BY MOUTH TWICE DAILY 180 tablet 1  . citalopram (CELEXA) 10 MG tablet Take by mouth.    . HUMarland KitchenALOG 100 UNIT/ML injection Inject 20 Units into the skin continuous.   0  . hydrochlorothiazide (HYDRODIURIL) 25 MG tablet TAKE 1 TABLET(25 MG) BY MOUTH DAILY 90 tablet 3  . Insulin Disposable Pump (V-GO 20) KIT Humalog  3  . isosorbide mononitrate (IMDUR) 30 MG 24 hr  tablet TAKE 1 TABLET(30 MG) BY MOUTH DAILY 60 tablet 1  . losartan (COZAAR) 25 MG tablet Take 1 tablet (25 mg total) by mouth daily. 90 tablet 3  . Menthol, Topical Analgesic, (ICY HOT MEDICATED SPRAY EX) Apply 1 spray topically daily as needed (pain).    . metFORMIN (GLUCOPHAGE) 500 MG tablet Take 1 tablet (500 mg total) by mouth daily.    . metoprolol tartrate (LOPRESSOR) 25 MG tablet TAKE 1 TABLET BY MOUTH TWICE DAILY 180 tablet 3  . nitroGLYCERIN (NITROSTAT) 0.4 MG SL tablet Place 1 tablet (0.4 mg total) under the tongue every 5 (five) minutes x 3 doses as needed for chest pain. 25 tablet 2  . potassium chloride SA (K-DUR,KLOR-CON) 20 MEQ tablet Take 20 mEq by mouth daily.   2  . rosuvastatin (CRESTOR) 40 MG tablet Take 1 tablet (40 mg total) by mouth daily. 90 tablet 3  . traMADol (ULTRAM) 50 MG tablet TK 1 T PO BID     No current facility-administered medications on file prior to visit.    Allergies  Allergen Reactions  . Iohexol Hives and Other (See Comments)  Excessive sweating    No problem-specific Assessment & Plan notes found for this encounter.      Dmiyah Liscano Rodriguez-Guzman PharmD, BCPS, [place] [address] 05/10/2020 7:46 AM

## 2020-05-20 ENCOUNTER — Ambulatory Visit (INDEPENDENT_AMBULATORY_CARE_PROVIDER_SITE_OTHER): Payer: Medicare Other | Admitting: Pharmacist Clinician (PhC)/ Clinical Pharmacy Specialist

## 2020-05-20 ENCOUNTER — Other Ambulatory Visit: Payer: Self-pay

## 2020-05-20 DIAGNOSIS — E1169 Type 2 diabetes mellitus with other specified complication: Secondary | ICD-10-CM

## 2020-05-20 DIAGNOSIS — E785 Hyperlipidemia, unspecified: Secondary | ICD-10-CM

## 2020-05-20 NOTE — Progress Notes (Signed)
05/21/2020 Phyllis Conner 08-28-1945 [id number]   HPI:  Phyllis Conner is a 75 y.o. female patient of Dr Ellyn Hack, who presents today for a lipid clinic evaluation.  See pertinent past medical history below.   She was seen by Dr. Ellyn Hack in November, at which time she was 7 years post NSTEMI.  She has done well for the most part from a cardiovascular point of view, but does endorse occasional chest pain, possibly musculoskeletal.    Today she is in the office to discuss further treatment of her cholesterol, as she is not to LDL goal despite compliance with high intensity statin.    Past Medical History: ASCVD NSTEMI (7 yrs ago) DES to LAD, OM1; mild aortic stenosis  PE 2011  hypertension Losartan 25 mg recently added to hctz, metoprolol, amlodipine  DM2 1/22 A1c 10.2 - on Humalog (V-Go) and metformin    Current Medications: rosuvastatin 40 mg qd  Cholesterol Goals: LDL < 70   Intolerant/previously tried: none  Family history: father died in 41's prostate cancer; mother died at 84 - asthma; brother died from [place] in his 19's ; 4 children, no known heart disease  Diet: mostly home cooked meals, mix of proteins.  Admits that biggest issue is breads, potatoes, pasta  Exercise:  No current exercise  Labs: 1/22:  TC 175, TG 118, HDL 59, LDL 95 (on rosuvastatin 40)   Current Outpatient Medications  Medication Sig Dispense Refill  . ACCU-CHEK AVIVA PLUS test strip 1 each by Other route 2 (two) times daily.   2  . amLODipine (NORVASC) 10 MG tablet TAKE 1 TABLET BY MOUTH EVERY DAY 90 tablet 3  . aspirin EC 81 MG tablet Take 81 mg by mouth daily. Swallow whole.    . citalopram (CELEXA) 10 MG tablet Take by mouth.    . hydrochlorothiazide (HYDRODIURIL) 25 MG tablet TAKE 1 TABLET(25 MG) BY MOUTH DAILY 90 tablet 3  . Insulin Disposable Pump (V-GO 20) KIT Humalog  3  . isosorbide mononitrate (IMDUR) 30 MG 24 hr tablet TAKE 1 TABLET(30 MG) BY MOUTH DAILY 60 tablet 1  . losartan (COZAAR) 25 MG  tablet Take 1 tablet (25 mg total) by mouth daily. 90 tablet 3  . metFORMIN (GLUCOPHAGE) 500 MG tablet Take 1 tablet (500 mg total) by mouth daily.    . metoprolol tartrate (LOPRESSOR) 25 MG tablet TAKE 1 TABLET BY MOUTH TWICE DAILY 180 tablet 3  . nitroGLYCERIN (NITROSTAT) 0.4 MG SL tablet Place 1 tablet (0.4 mg total) under the tongue every 5 (five) minutes x 3 doses as needed for chest pain. 25 tablet 2  . potassium chloride SA (K-DUR,KLOR-CON) 20 MEQ tablet Take 20 mEq by mouth daily.   2  . rosuvastatin (CRESTOR) 40 MG tablet Take 1 tablet (40 mg total) by mouth daily. 90 tablet 3  . traMADol (ULTRAM) 50 MG tablet TK 1 T PO BID    . HUMALOG 100 UNIT/ML injection Inject 20 Units into the skin continuous.   0   No current facility-administered medications for this visit.    Allergies  Allergen Reactions  . Iohexol Hives and Other (See Comments)    Excessive sweating    Past Medical History:  Diagnosis Date  . Acid reflux   . Anemia    "when I was younger"  . Aortic stenosis    mild AS 09/14/17 echo  . Arthritis   . CAD S/P percutaneous coronary angioplasty November 2014   Mid LAD 2.5 mm  x 12 mm Promus DES (2.70 mm); proximal OM1 - Promus DES 2.75 mm x 16 mm (2.85 mm)  . Diabetes mellitus type 2 in obese ([place])   . Essential hypertension   . Headache   . Hyperlipidemia with target LDL less than 70   . Non-Q wave ST elevation myocardial infarction (STEMI) involving left anterior descending (LAD) coronary artery November 2014   PCI to LAD and circumflex  . Obesity (BMI 30-39.9) 05/05/2013    Blood pressure 116/60, pulse 74, resp. rate 16, height 5' 3"  (1.6 m), weight 231 lb (104.8 kg), SpO2 97 %.   Hyperlipidemia associated with type 2 diabetes mellitus ([place]) Patient with hyperlipidemia and ASCVD, LDL not at goal, currently at 95 despite compliance with high intensity statin use.  Reviewed options for lowering LDL cholesterol, including ezetimibe, PCSK-9 inhibitors, bempedoic  acid and inclisiran.  Discussed mechanisms of action, dosing, side effects and potential decreases in LDL cholesterol.  Answered all patient questions.  Based on this information, patient would prefer to start [place] [facility].  We will submit PA request to her insurance.  Once approved she will need to repeat labs in 3 months to determine if at goal.     Tommy Medal PharmD CPP [facility] [address] [place], [place] [address] [phone]

## 2020-05-20 NOTE — Patient Instructions (Signed)
Your Results:             Your most recent labs Goal  Total Cholesterol 175 < 200  Triglycerides 118 < 150  HDL (happy/good cholesterol) 59 > 40  LDL (lousy/bad cholesterol 95 < 70    Medication changes:  We will start the paperwork to get Repatha Pushtronix covered by your insurance.  Once approved, use 1 device once monthly  Lab orders:  We will repeat labs in 3 months.    Patient Assistance:  The [employer] foundation offers assistance to help pay for medication copays.  They will cover copays for all cholesterol lowering meds, including statins, fibrates, omega-3 oils, ezetimibe, Repatha, Praluent, Nexletol, Nexlizet.  The cards are usually good for $2,500 or 12 months, whichever comes first. 1. Go to healthwellfoundation.org 2. Click on "Apply Now" 3. Answer questions as to whom is applying (patient or representative) 4. Your disease fund will be "hypercholesterolemia - Medicare access" 5. They will ask questions about finances and which medications you are taking for cholesterol 6. When you submit, the approval is usually within minutes.  You will need to print the card information from the site 7. You will need to show this information to your pharmacy, they will bill your Medicare Part D plan first -then bill [employer] --for the copay.   You can also call them at [phone], although the hold times can be quite long.   Thank you for choosing [facility]

## 2020-05-21 ENCOUNTER — Encounter: Payer: Self-pay | Admitting: Pharmacist Clinician (PhC)/ Clinical Pharmacy Specialist

## 2020-05-21 NOTE — Assessment & Plan Note (Signed)
Patient with hyperlipidemia and ASCVD, LDL not at goal, currently at 95 despite compliance with high intensity statin use.  Reviewed options for lowering LDL cholesterol, including ezetimibe, PCSK-9 inhibitors, bempedoic acid and inclisiran.  Discussed mechanisms of action, dosing, side effects and potential decreases in LDL cholesterol.  Answered all patient questions.  Based on this information, patient would prefer to start [place] [facility].  We will submit PA request to her insurance.  Once approved she will need to repeat labs in 3 months to determine if at goal.

## 2020-05-24 ENCOUNTER — Telehealth: Payer: Self-pay

## 2020-05-24 DIAGNOSIS — E785 Hyperlipidemia, unspecified: Secondary | ICD-10-CM

## 2020-05-24 DIAGNOSIS — E1169 Type 2 diabetes mellitus with other specified complication: Secondary | ICD-10-CM

## 2020-05-24 MED ORDER — REPATHA PUSHTRONEX SYSTEM 420 MG/3.5ML ~~LOC~~ SOCT: 420.0000 mg | SUBCUTANEOUS | 11 refills | Status: DC

## 2020-05-24 NOTE — Telephone Encounter (Signed)
Called and spoke w/pt regarding the approval of repatha 420, rx sent, pt intsructed to complete fasting labs after her 2nd dose and they were ordered. Pt voiced understanding and was instructed to call back if unaffordable

## 2020-06-17 DIAGNOSIS — Z0000 Encounter for general adult medical examination without abnormal findings: Secondary | ICD-10-CM | POA: Diagnosis not present

## 2020-06-17 DIAGNOSIS — I119 Hypertensive heart disease without heart failure: Secondary | ICD-10-CM | POA: Diagnosis not present

## 2020-06-17 DIAGNOSIS — E1169 Type 2 diabetes mellitus with other specified complication: Secondary | ICD-10-CM | POA: Diagnosis not present

## 2020-06-17 DIAGNOSIS — M150 Primary generalized (osteo)arthritis: Secondary | ICD-10-CM | POA: Diagnosis not present

## 2020-06-17 DIAGNOSIS — E785 Hyperlipidemia, unspecified: Secondary | ICD-10-CM | POA: Diagnosis not present

## 2020-08-09 ENCOUNTER — Other Ambulatory Visit: Payer: Self-pay | Admitting: Cardiology

## 2020-09-03 DIAGNOSIS — R5383 Other fatigue: Secondary | ICD-10-CM | POA: Diagnosis not present

## 2020-09-03 DIAGNOSIS — E1169 Type 2 diabetes mellitus with other specified complication: Secondary | ICD-10-CM | POA: Diagnosis not present

## 2020-09-03 DIAGNOSIS — E785 Hyperlipidemia, unspecified: Secondary | ICD-10-CM | POA: Diagnosis not present

## 2020-09-03 DIAGNOSIS — I259 Chronic ischemic heart disease, unspecified: Secondary | ICD-10-CM | POA: Diagnosis not present

## 2020-09-03 DIAGNOSIS — G479 Sleep disorder, unspecified: Secondary | ICD-10-CM | POA: Diagnosis not present

## 2020-09-27 ENCOUNTER — Other Ambulatory Visit (HOSPITAL_COMMUNITY): Payer: Self-pay | Admitting: Family Medicine

## 2020-09-27 DIAGNOSIS — Z1231 Encounter for screening mammogram for malignant neoplasm of breast: Secondary | ICD-10-CM

## 2020-10-04 DIAGNOSIS — I259 Chronic ischemic heart disease, unspecified: Secondary | ICD-10-CM | POA: Diagnosis not present

## 2020-10-04 DIAGNOSIS — E1169 Type 2 diabetes mellitus with other specified complication: Secondary | ICD-10-CM | POA: Diagnosis not present

## 2020-10-04 DIAGNOSIS — I10 Essential (primary) hypertension: Secondary | ICD-10-CM | POA: Diagnosis not present

## 2020-10-21 ENCOUNTER — Telehealth: Payer: Self-pay

## 2020-10-21 NOTE — Telephone Encounter (Signed)
Pa sent Phyllis Conner (Key: [id number])

## 2020-10-25 ENCOUNTER — Ambulatory Visit (HOSPITAL_COMMUNITY)
Admission: RE | Admit: 2020-10-25 | Discharge: 2020-10-25 | Disposition: A | Discharge status: Home / Self Care | Payer: Medicare Other | Source: Ambulatory Visit | Attending: Family Medicine | Admitting: Family Medicine

## 2020-10-25 ENCOUNTER — Other Ambulatory Visit: Payer: Self-pay

## 2020-10-25 DIAGNOSIS — Z1231 Encounter for screening mammogram for malignant neoplasm of breast: Secondary | ICD-10-CM | POA: Insufficient documentation

## 2020-11-15 DIAGNOSIS — I259 Chronic ischemic heart disease, unspecified: Secondary | ICD-10-CM | POA: Diagnosis not present

## 2020-11-15 DIAGNOSIS — I10 Essential (primary) hypertension: Secondary | ICD-10-CM | POA: Diagnosis not present

## 2020-11-15 DIAGNOSIS — E1169 Type 2 diabetes mellitus with other specified complication: Secondary | ICD-10-CM | POA: Diagnosis not present

## 2020-11-15 DIAGNOSIS — M130 Polyarthritis, unspecified: Secondary | ICD-10-CM | POA: Diagnosis not present

## 2020-11-15 DIAGNOSIS — M150 Primary generalized (osteo)arthritis: Secondary | ICD-10-CM | POA: Diagnosis not present

## 2020-11-15 DIAGNOSIS — I252 Old myocardial infarction: Secondary | ICD-10-CM | POA: Diagnosis not present

## 2021-01-17 DIAGNOSIS — I259 Chronic ischemic heart disease, unspecified: Secondary | ICD-10-CM | POA: Diagnosis not present

## 2021-01-17 DIAGNOSIS — E785 Hyperlipidemia, unspecified: Secondary | ICD-10-CM | POA: Diagnosis not present

## 2021-01-17 DIAGNOSIS — R600 Localized edema: Secondary | ICD-10-CM | POA: Diagnosis not present

## 2021-01-17 DIAGNOSIS — E1169 Type 2 diabetes mellitus with other specified complication: Secondary | ICD-10-CM | POA: Diagnosis not present

## 2021-01-17 DIAGNOSIS — Z23 Encounter for immunization: Secondary | ICD-10-CM | POA: Diagnosis not present

## 2021-01-17 DIAGNOSIS — M130 Polyarthritis, unspecified: Secondary | ICD-10-CM | POA: Diagnosis not present

## 2021-01-17 DIAGNOSIS — I10 Essential (primary) hypertension: Secondary | ICD-10-CM | POA: Diagnosis not present

## 2021-01-23 ENCOUNTER — Encounter (HOSPITAL_COMMUNITY): Payer: Self-pay | Admitting: *Deleted

## 2021-01-23 ENCOUNTER — Emergency Department (HOSPITAL_COMMUNITY): Payer: Medicare Other

## 2021-01-23 ENCOUNTER — Other Ambulatory Visit: Payer: Self-pay

## 2021-01-23 ENCOUNTER — Emergency Department (HOSPITAL_COMMUNITY)
Admission: EM | Admit: 2021-01-23 | Discharge: 2021-01-23 | Disposition: A | Discharge status: Home / Self Care | Payer: Medicare Other | Attending: Emergency Medicine | Admitting: Emergency Medicine

## 2021-01-23 DIAGNOSIS — E119 Type 2 diabetes mellitus without complications: Secondary | ICD-10-CM | POA: Diagnosis not present

## 2021-01-23 DIAGNOSIS — Z794 Long term (current) use of insulin: Secondary | ICD-10-CM | POA: Insufficient documentation

## 2021-01-23 DIAGNOSIS — R0789 Other chest pain: Secondary | ICD-10-CM | POA: Diagnosis not present

## 2021-01-23 DIAGNOSIS — I10 Essential (primary) hypertension: Secondary | ICD-10-CM | POA: Diagnosis not present

## 2021-01-23 DIAGNOSIS — Z79899 Other long term (current) drug therapy: Secondary | ICD-10-CM | POA: Insufficient documentation

## 2021-01-23 DIAGNOSIS — I2510 Atherosclerotic heart disease of native coronary artery without angina pectoris: Secondary | ICD-10-CM | POA: Insufficient documentation

## 2021-01-23 DIAGNOSIS — Z7982 Long term (current) use of aspirin: Secondary | ICD-10-CM | POA: Insufficient documentation

## 2021-01-23 DIAGNOSIS — Z96642 Presence of left artificial hip joint: Secondary | ICD-10-CM | POA: Diagnosis not present

## 2021-01-23 DIAGNOSIS — R079 Chest pain, unspecified: Secondary | ICD-10-CM | POA: Insufficient documentation

## 2021-01-23 DIAGNOSIS — Z7984 Long term (current) use of oral hypoglycemic drugs: Secondary | ICD-10-CM | POA: Diagnosis not present

## 2021-01-23 DIAGNOSIS — R0602 Shortness of breath: Secondary | ICD-10-CM | POA: Diagnosis not present

## 2021-01-23 LAB — BASIC METABOLIC PANEL
Anion gap: 8 (ref 5–15)
BUN: 30 mg/dL — ABNORMAL HIGH (ref 8–23)
CO2: 26 mmol/L (ref 22–32)
Calcium: 9.2 mg/dL (ref 8.9–10.3)
Chloride: 101 mmol/L (ref 98–111)
Creatinine, Ser: 1.65 mg/dL — ABNORMAL HIGH (ref 0.44–1.00)
GFR, Estimated: 33 mL/min — ABNORMAL LOW (ref >60)
Glucose, Bld: 129 mg/dL — ABNORMAL HIGH (ref 70–99)
Potassium: 3.5 mmol/L (ref 3.5–5.1)
Sodium: 135 mmol/L (ref 135–145)

## 2021-01-23 LAB — CBC
HCT: 32.6 % — ABNORMAL LOW (ref 36.0–46.0)
Hemoglobin: 10.5 g/dL — ABNORMAL LOW (ref 12.0–15.0)
MCH: 31.2 pg (ref 26.0–34.0)
MCHC: 32.2 g/dL (ref 30.0–36.0)
MCV: 96.7 fL (ref 80.0–100.0)
Platelets: 189 10*3/uL (ref 150–400)
RBC: 3.37 MIL/uL — ABNORMAL LOW (ref 3.87–5.11)
RDW: 14.1 % (ref 11.5–15.5)
WBC: 8.2 10*3/uL (ref 4.0–10.5)
nRBC: 0 % (ref 0.0–0.2)

## 2021-01-23 LAB — TROPONIN I (HIGH SENSITIVITY)
Troponin I (High Sensitivity): 4 ng/L (ref <18)
Troponin I (High Sensitivity): 4 ng/L (ref <18)

## 2021-01-23 NOTE — ED Provider Notes (Signed)
[facility] EMERGENCY DEPARTMENT Provider Note   CSN: [id number] Arrival date & time: 01/23/21  1857     History Chief Complaint  Patient presents with   Chest Pain    Phyllis Conner is a 75 y.o. female.  HPI 75 year old female presents with chest pain.  She has felt a soreness in her chest since around 3 AM.  Woke up with around 3 AM and eventually went away with no treatment.  This evening around 5 PM it happened again and she took a nap and then it went away.  Was little nauseated.  Right now her right superior chest feels a little sore but the rest of her chest is better.  Some shortness of breath that was transient.  She has a cardiac history with multiple stents which she states this is nothing like what the cardiac pain was feeling like.  Right now she feels pretty good.  She has chronic leg edema from CHF but she states that is pretty much improved.  No unilateral leg swelling or pain or history of travel or DVT.  Past Medical History:  Diagnosis Date   Acid reflux    Anemia    "when I was younger"   Aortic stenosis    mild AS 09/14/17 echo   Arthritis    CAD S/P percutaneous coronary angioplasty November 2014   Mid LAD 2.5 mm x 12 mm Promus DES (2.70 mm); proximal OM1 - Promus DES 2.75 mm x 16 mm (2.85 mm)   Diabetes mellitus type 2 in obese [facility])    Essential hypertension    Headache    Hyperlipidemia with target LDL less than 70    Non-Q wave ST elevation myocardial infarction (STEMI) involving left anterior descending (LAD) coronary artery November 2014   PCI to LAD and circumflex   Obesity (BMI 30-39.9) 05/05/2013    Patient Active Problem List   Diagnosis Date Noted   Mild aortic stenosis by prior echocardiogram 03/07/2020   Primary osteoarthritis of left hip 10/17/2017   Osteoarthritis of left hip 10/16/2017   Preop cardiovascular exam 09/10/2017   Orthopnea 09/10/2017   Right carotid bruit 10/25/2013   Diabetes mellitus type 2 in obese ([place])     Hyperlipidemia associated with type 2 diabetes mellitus ([place])    Orthostatic hypotension 08/28/2013   Renal insufficiency 08/19/2013   Obesity (BMI 30-39.9) 05/05/2013   CAD S/P percutaneous coronary angioplasty -- LAD/OM1 DES 02/17/13 02/18/2013   Pulmonary embolism August 2011 (syncope) 02/18/2013   NSTEMI (non-ST elevated myocardial infarction) ([place]) 02/15/2013   Essential hypertension 02/15/2013    Past Surgical History:  Procedure Laterality Date   ABDOMINAL HYSTERECTOMY  1990s   Arthroscopic knee surgery Bilateral 1984, 2002   Arthroscopic   CARDIAC CATHETERIZATION  November 2014   Mid LAD 95%, proximal OM1 80-90% --> two-vessel PCI   COLONOSCOPY N/A 12/05/2013   Procedure: COLONOSCOPY;  Surgeon: Beryle Beams, MD;  [facility]: [facility]OS[facility];  Service: Endoscopy;  Laterality: N/A;   COLONOSCOPY WITH PROPOFOL N/A 12/11/2014   Procedure: COLONOSCOPY WITH PROPOFOL;  Surgeon: Carol Ada, MD;  [facility]OS[facility];  Service: Endoscopy;  Laterality: N/A;   ESOPHAGOGASTRODUODENOSCOPY N/A 12/05/2013   Procedure: ESOPHAGOGASTRODUODENOSCOPY (EGD);  Surgeon: Beryle Beams, MD;  Location: [facility];  Service: Endoscopy;  Laterality: N/A;   LEFT HEART CATHETERIZATION WITH CORONARY ANGIOGRAM N/A 02/17/2013   Procedure: LEFT HEART CATHETERIZATION WITH CORONARY ANGIOGRAM;  Surgeon: Sanda Klein, MD;  Location: [facility] [place] CATH LAB;  Service: Cardiovascular;  Laterality:  N/A;   PERCUTANEOUS CORONARY STENT INTERVENTION (PCI-S)  November 2014   Mid LAD 2.5 mm x 12 mm (2.75 mm) Promus DES; OM1 2.75 mm x 60 mm (2.85 mm) Promus DES   TOTAL HIP ARTHROPLASTY Left 10/17/2017   TOTAL HIP ARTHROPLASTY Left 10/17/2017   Procedure: TOTAL HIP ARTHROPLASTY ANTERIOR APPROACH;  Surgeon: [name], MD;  Location: [place];  Service: Orthopedics;  Laterality: Left;   TRANSTHORACIC ECHOCARDIOGRAM  09/2017    Normal EF 55-60%. No RWMA.  Gr 1-2DD.  Mild AS. (mean gradient 21 mmHg)      OB History   No obstetric history  on file.     Family History  Problem Relation Age of Onset   Cancer Father        prostate cancer   Heart disease Mother    Heart attack Mother    Asthma Mother    Heart disease Sister    Heart disease Brother     Social History   Tobacco Use   Smoking status: Never   Smokeless tobacco: Never  Vaping Use   Vaping Use: Never used  Substance Use Topics   Alcohol use: No   Drug use: No    Home Medications Prior to Admission medications   Medication Sig Start Date End Date Taking? Authorizing Provider  amLODipine (NORVASC) 10 MG tablet TAKE 1 TABLET BY MOUTH EVERY DAY 02/06/20  Yes Leonie Man, MD  aspirin EC 81 MG tablet Take 81 mg by mouth daily. Swallow whole.   Yes [provider]  citalopram (CELEXA) 20 MG tablet Take 20 mg by mouth daily. 11/06/20  Yes [provider]  HUMALOG 100 UNIT/ML injection Inject 20 Units into the skin continuous.    Yes [provider]  hydrochlorothiazide (HYDRODIURIL) 25 MG tablet TAKE 1 TABLET(25 MG) BY MOUTH DAILY Patient taking differently: Take 50 mg by mouth daily. 02/06/20  Yes Leonie Man, MD  isosorbide mononitrate (IMDUR) 30 MG 24 hr tablet TAKE 1 TABLET(30 MG) BY MOUTH DAILY 08/09/20  Yes Leonie Man, MD  losartan (COZAAR) 25 MG tablet Take 1 tablet (25 mg total) by mouth daily. 02/20/20 01/23/21 Yes Leonie Man, MD  metFORMIN (GLUCOPHAGE) 500 MG tablet Take 1 tablet (500 mg total) by mouth daily. Patient taking differently: Take 1,000 mg by mouth 2 (two) times daily. 02/20/13  Yes Kilroy, Lurena Joiner K, PA-C  metoprolol tartrate (LOPRESSOR) 25 MG tablet TAKE 1 TABLET BY MOUTH TWICE DAILY 04/12/20  Yes Leonie Man, MD  nitroGLYCERIN (NITROSTAT) 0.4 MG SL tablet Place 1 tablet (0.4 mg total) under the tongue every 5 (five) minutes x 3 doses as needed for chest pain. 02/18/13  Yes Kilroy, Luke K, PA-C  potassium chloride SA (K-DUR,KLOR-CON) 20 MEQ tablet Take 20 mEq by mouth daily.  09/14/14  Yes  [provider]  rosuvastatin (CRESTOR) 40 MG tablet Take 1 tablet (40 mg total) by mouth daily. 02/20/20  Yes Leonie Man, MD  TRULICITY 1.5 YC/1.4GY SOPN Inject 1.5 mg into the skin once a week. Takes every Monday 12/28/20  Yes [provider]  ACCU-CHEK AVIVA PLUS test strip 1 each by Other route 2 (two) times daily.  01/08/14   [provider]  citalopram (CELEXA) 10 MG tablet Take 10 mg by mouth daily. Patient not taking: Reported on 01/23/2021 02/06/20   [provider]  Evolocumab with Infusor ([place]) 420 MG/3.5ML SOCT Inject 420 mg into the skin every 30 (thirty) days.  Patient not taking: No sig reported 05/24/20   Leonie Man, MD  Insulin Disposable Pump (V-GO 20) KIT Humalog 01/14/14   [provider]  traMADol (ULTRAM) 50 MG tablet TK 1 T PO BID Patient not taking: No sig reported 10/14/18   [provider]    Allergies    Iohexol  Review of Systems   Review of Systems  Constitutional:  Negative for fever.  Respiratory:  Positive for shortness of breath.   Cardiovascular:  Positive for chest pain.  Gastrointestinal:  Positive for nausea. Negative for vomiting.  All other systems reviewed and are negative.  Physical Exam Updated Vital Signs BP 111/78   Pulse 69   Temp 97.8 F (36.6 C) (Oral)   Resp 18   Ht 5' 3" (1.6 m)   Wt 98.9 kg   SpO2 96%   BMI 38.62 kg/m   Physical Exam Vitals and nursing note reviewed.  Constitutional:      General: She is not in acute distress.    Appearance: She is well-developed. She is not ill-appearing or diaphoretic.  HENT:     Head: Normocephalic and atraumatic.     Right Ear: External ear normal.     Left Ear: External ear normal.     Nose: Nose normal.  Eyes:     General:        Right eye: No discharge.        Left eye: No discharge.  Cardiovascular:     Rate and Rhythm: Normal rate and regular rhythm.     Heart sounds: Normal heart sounds.   Pulmonary:     Effort: Pulmonary effort is normal.     Breath sounds: Normal breath sounds.  Chest:     Chest wall: Tenderness (mild, mostly right sided) present.  Abdominal:     Palpations: Abdomen is soft.     Tenderness: There is no abdominal tenderness.  Musculoskeletal:     Right lower leg: No tenderness.     Left lower leg: No tenderness.  Skin:    General: Skin is warm and dry.  Neurological:     Mental Status: She is alert.  Psychiatric:        Mood and Affect: Mood is not anxious.    ED Results / Procedures / Treatments   Labs (all labs ordered are listed, but only abnormal results are displayed) Labs Reviewed  BASIC METABOLIC PANEL - Abnormal; Notable for the following components:      Result Value   Glucose, Bld 129 (*)    BUN 30 (*)    Creatinine, Ser 1.65 (*)    GFR, Estimated 33 (*)    All other components within normal limits  CBC - Abnormal; Notable for the following components:   RBC 3.37 (*)    Hemoglobin 10.5 (*)    HCT 32.6 (*)    All other components within normal limits  TROPONIN I (HIGH SENSITIVITY)  TROPONIN I (HIGH SENSITIVITY)    EKG EKG Interpretation  Date/Time:  Sunday January 23 2021 19:05:36 EDT Ventricular Rate:  73 PR Interval:  170 QRS Duration: 78 QT Interval:  402 QTC Calculation: 442 R Axis:   84 Text Interpretation: Sinus rhythm with occasional Premature ventricular complexes Otherwise normal ECG Confirmed by Sherwood Gambler [phone]) on 01/23/2021 7:19:13 PM  Radiology DG Chest Portable 1 View  Result Date: 01/23/2021 CLINICAL DATA:  Shortness of breath EXAM: PORTABLE CHEST 1 VIEW COMPARISON:  10/08/2017 FINDINGS: The heart size and mediastinal  contours are within normal limits. Both lungs are clear. The visualized skeletal structures are unremarkable. IMPRESSION: No active disease. Electronically Signed   By: Ulyses Jarred M.D.   On: 01/23/2021 19:53    Procedures Procedures   Medications Ordered in ED Medications -  No data to display  ED Course  I have reviewed the triage vital signs and the nursing notes.  Pertinent labs & imaging results that were available during my care of the patient were reviewed by me and considered in my medical decision making (see chart for details).    MDM Rules/Calculators/A&P                           Patient with nonspecific chest pain that does not feel like prior heart issues.  ECG without acute ischemia.  Troponins negative x2.  Probably chest wall in etiology.  We discussed options including being observed but she would like to follow-up with cardiology as an outpatient which is reasonable.  Low suspicion for ACS, PE, dissection with this presentation.  Discharged home with return precautions. Final Clinical Impression(s) / ED Diagnoses Final diagnoses:  Nonspecific chest pain    Rx / DC Orders ED Discharge Orders     None        Sherwood Gambler, MD 01/23/21 2350

## 2021-01-23 NOTE — Discharge Instructions (Addendum)
If you develop recurrent, continued, or worsening chest pain, shortness of breath, fever, vomiting, abdominal or back pain, or any other new/concerning symptoms then return to the ER for evaluation.  

## 2021-01-23 NOTE — ED Triage Notes (Signed)
Pt with mid CP since this morning with mild SOB.  Pt states pain has eased some throughout the day but had radiated to her back and neck later today.  + nausea but denies any emesis.

## 2021-02-03 ENCOUNTER — Other Ambulatory Visit: Payer: Self-pay | Admitting: Cardiology

## 2021-02-21 NOTE — Progress Notes (Deleted)
Cardiology Office Note:    Date:  02/21/2021   ID:  Phyllis Conner, DOB Jul 20, 1945, MRN [id number]  PCP:  Lucianne Lei, MD  Cardiologist:  Glenetta Hew, MD   Referring MD: Lucianne Lei, MD   No chief complaint on file. ***  History of Present Illness:    Phyllis Conner is a 75 y.o. female with a hx of CAD, DM 2, hypertension, hyperlipidemia, obesity, and mild aortic stenosis. In 2014 she underwent PCI with DES placed to LAD/OM1.  She has remained on maintenance dose of Brilinta.  Aspirin monotherapy has been discussed in the past, and she prefers to continue on 60 mg Brilinta twice daily.  She was last seen by Dr. Ellyn Hack on 02/20/2020.  Lipitor was changed to Crestor 40 mg because her LDL was not at goal.  Follow-up labs have not been completed.  25 mg of losartan was also added to her regimen.  Echocardiogram in 2019 showed LVEF of 55 to 60%, no regional wall motion abnormality, grade 1-2 diastolic dysfunction, and mild aortic stenosis.  On 01/23/2021 she presented to [facility], ER with complaints of chest pain.  She states that her chest pain did not feel like prior episodes that resulted in PCI.  She ruled out with negative troponins.  She presents today for ER follow-up.    Chest pain -She has a history of MSK chest pain   CAD -Prior PCI to LAD/OM1 Continue low-dose Brilinta   Hyperlipidemia with LDL goal less than 70 Lipitor was switched to Crestor 40 mg.  She has not had labs rechecked.   Hypertension She is maintained on 10 mg amlodipine, 25 mg HCTZ, 30 mg Imdur, 25 mg metoprolol tartrate twice daily, and 25 mg losartan     Past Medical History:  Diagnosis Date   Acid reflux    Anemia    "when I was younger"   Aortic stenosis    mild AS 09/14/17 echo   Arthritis    CAD S/P percutaneous coronary angioplasty November 2014   Mid LAD 2.5 mm x 12 mm Promus DES (2.70 mm); proximal OM1 - Promus DES 2.75 mm x 16 mm (2.85 mm)   Diabetes mellitus type 2 in obese  [facility])    Essential hypertension    Headache    Hyperlipidemia with target LDL less than 70    Non-Q wave ST elevation myocardial infarction (STEMI) involving left anterior descending (LAD) coronary artery November 2014   PCI to LAD and circumflex   Obesity (BMI 30-39.9) 05/05/2013    Past Surgical History:  Procedure Laterality Date   ABDOMINAL HYSTERECTOMY  1990s   Arthroscopic knee surgery Bilateral 1984, 2002   Arthroscopic   CARDIAC CATHETERIZATION  November 2014   Mid LAD 95%, proximal OM1 80-90% --> two-vessel PCI   COLONOSCOPY N/A 12/05/2013   Procedure: COLONOSCOPY;  Surgeon: Beryle Beams, MD;  [facility];  Service: Endoscopy;  Laterality: N/A;   COLONOSCOPY WITH PROPOFOL N/A 12/11/2014   Procedure: COLONOSCOPY WITH PROPOFOL;  Surgeon: Carol Ada, MD;  [facility];  Service: Endoscopy;  Laterality: N/A;   ESOPHAGOGASTRODUODENOSCOPY N/A 12/05/2013   Procedure: ESOPHAGOGASTRODUODENOSCOPY (EGD);  Surgeon: Beryle Beams, MD;  Location: [facility];  Service: Endoscopy;  Laterality: N/A;   LEFT HEART CATHETERIZATION WITH CORONARY ANGIOGRAM N/A 02/17/2013   Procedure: LEFT HEART CATHETERIZATION WITH CORONARY ANGIOGRAM;  Surgeon: Sanda Klein, MD;  Location: [facility] CATH LAB;  Service: Cardiovascular;  Laterality: N/A;   PERCUTANEOUS CORONARY STENT INTERVENTION (PCI-S)  November 2014   Mid LAD 2.5 mm x 12 mm (2.75 mm) Promus DES; OM1 2.75 mm x 60 mm (2.85 mm) Promus DES   TOTAL HIP ARTHROPLASTY Left 10/17/2017   TOTAL HIP ARTHROPLASTY Left 10/17/2017   Procedure: TOTAL HIP ARTHROPLASTY ANTERIOR APPROACH;  Surgeon: [name], MD;  Location: [place];  Service: Orthopedics;  Laterality: Left;   TRANSTHORACIC ECHOCARDIOGRAM  09/2017    Normal EF 55-60%. No RWMA.  Gr 1-2DD.  Mild AS. (mean gradient 21 mmHg)     Current Medications: No outpatient medications have been marked as taking for the 02/25/21 encounter (Appointment) with Ledora Bottcher, [place].      Allergies:   Iohexol   Social History   Socioeconomic History   Marital status: Single    Spouse name: Not on file   Number of children: Not on file   Years of education: Not on file   Highest education level: Not on file  Occupational History   Not on file  Tobacco Use   Smoking status: Never   Smokeless tobacco: Never  Vaping Use   Vaping Use: Never used  Substance and Sexual Activity   Alcohol use: No   Drug use: No   Sexual activity: Not on file  Other Topics Concern   Not on file  Social History Narrative   She is married with 4 children.   She does not smoke and does not drink alcohol.   Social Determinants of Health   Financial Resource Strain: Not on file  Food Insecurity: Not on file  Transportation Needs: Not on file  Physical Activity: Not on file  Stress: Not on file  Social Connections: Not on file     Family History: The patient's ***family history includes Asthma in her mother; Cancer in her father; Heart attack in her mother; Heart disease in her brother, mother, and sister.  ROS:   Please see the history of present illness.    *** All other systems reviewed and are negative.  EKGs/Labs/Other Studies Reviewed:    The following studies were reviewed today: ***  EKG:  EKG is *** ordered today.  The ekg ordered today demonstrates ***  Recent Labs: 01/23/2021: BUN 30; Creatinine, Ser 1.65; Hemoglobin 10.5; Platelets 189; Potassium 3.5; Sodium 135  Recent Lipid Panel    Component Value Date/Time   CHOL 156 08/18/2013 0829   TRIG 88 08/18/2013 0829   HDL 50 08/18/2013 0829   CHOLHDL 3.1 08/18/2013 0829   VLDL 18 08/18/2013 0829   LDLCALC 88 08/18/2013 0829    Physical Exam:    VS:  There were no vitals taken for this visit.    Wt Readings from Last 3 Encounters:  01/23/21 218 lb (98.9 kg)  05/20/20 231 lb (104.8 kg)  02/20/20 227 lb 6.4 oz (103.1 kg)     GEN: *** Well nourished, well developed in no acute distress HEENT:  Normal NECK: No JVD; No carotid bruits LYMPHATICS: No lymphadenopathy CARDIAC: ***RRR, no murmurs, rubs, gallops RESPIRATORY:  Clear to auscultation without rales, wheezing or rhonchi  ABDOMEN: Soft, non-tender, non-distended MUSCULOSKELETAL:  No edema; No deformity  SKIN: Warm and dry NEUROLOGIC:  Alert and oriented x 3 PSYCHIATRIC:  Normal affect   ASSESSMENT:    No diagnosis found. PLAN:    In order of problems listed above:  No diagnosis found.   Medication Adjustments/Labs and Tests Ordered: Current medicines are reviewed at length with the patient today.  Concerns regarding medicines are outlined above.  No orders of the defined types were placed in this encounter.  No orders of the defined types were placed in this encounter.   Signed, Ledora Bottcher, PA  02/21/2021 7:45 AM    [facility]

## 2021-02-25 ENCOUNTER — Other Ambulatory Visit: Payer: Self-pay

## 2021-02-25 ENCOUNTER — Encounter: Payer: Self-pay | Admitting: Physician Assistant

## 2021-02-25 ENCOUNTER — Ambulatory Visit (INDEPENDENT_AMBULATORY_CARE_PROVIDER_SITE_OTHER): Payer: Medicare Other | Admitting: Nurse Practitioner

## 2021-02-25 VITALS — BP 112/62 | HR 61 | Ht 65.0 in | Wt 209.2 lb

## 2021-02-25 DIAGNOSIS — I2510 Atherosclerotic heart disease of native coronary artery without angina pectoris: Secondary | ICD-10-CM | POA: Diagnosis not present

## 2021-02-25 DIAGNOSIS — R42 Dizziness and giddiness: Secondary | ICD-10-CM | POA: Diagnosis not present

## 2021-02-25 DIAGNOSIS — R079 Chest pain, unspecified: Secondary | ICD-10-CM | POA: Diagnosis not present

## 2021-02-25 DIAGNOSIS — E1169 Type 2 diabetes mellitus with other specified complication: Secondary | ICD-10-CM | POA: Diagnosis not present

## 2021-02-25 DIAGNOSIS — Z79899 Other long term (current) drug therapy: Secondary | ICD-10-CM

## 2021-02-25 DIAGNOSIS — R0600 Dyspnea, unspecified: Secondary | ICD-10-CM | POA: Diagnosis not present

## 2021-02-25 DIAGNOSIS — I350 Nonrheumatic aortic (valve) stenosis: Secondary | ICD-10-CM | POA: Diagnosis not present

## 2021-02-25 DIAGNOSIS — R0601 Orthopnea: Secondary | ICD-10-CM | POA: Diagnosis not present

## 2021-02-25 DIAGNOSIS — I10 Essential (primary) hypertension: Secondary | ICD-10-CM | POA: Diagnosis not present

## 2021-02-25 DIAGNOSIS — E785 Hyperlipidemia, unspecified: Secondary | ICD-10-CM

## 2021-02-25 DIAGNOSIS — E669 Obesity, unspecified: Secondary | ICD-10-CM

## 2021-02-25 MED ORDER — FUROSEMIDE 20 MG PO TABS: 20.0000 mg | ORAL_TABLET | Freq: Every day | ORAL | 3 refills | Status: DC

## 2021-02-25 NOTE — Patient Instructions (Addendum)
Medication Instructions:  Buy Over the Counter Pepcid   STOP Hydrochlorothiazide (HTCZ) START Lasix 20 mg daily  *If you need a refill on your cardiac medications before your next appointment, please call your pharmacy*  Lab Work: Your physician recommends that you return for lab work TODAY:  BMET BNP  Your physician recommends that you return for lab work at follow up appointment:  Fasting Lipid Panel-DO NOT EAT OR DRINK PAST MIDNIGHT. OKAY TO HAVE WATER. Hepatic (Liver) Function Test   If you have labs (blood work) drawn today and your tests are completely normal, you will receive your results only by: MyChart Message (if you have MyChart) OR A paper copy in the mail If you have any lab test that is abnormal or we need to change your treatment, we will call you to review the results.  Testing/Procedures: Your physician has requested that you have an echocardiogram. Echocardiography is a painless test that uses sound waves to create images of your heart. It provides your doctor with information about the size and shape of your heart and how well your heart's chambers and valves are working. This procedure takes approximately one hour. There are no restrictions for this procedure.  Please schedule for 1-2 weeks   Follow-Up: At [facility], you and your health needs are our priority.  As part of our continuing mission to provide you with exceptional heart care, we have created designated Provider Care Teams.  These Care Teams include your primary Cardiologist (physician) and Advanced Practice Providers (APPs -  Physician Assistants and Nurse Practitioners) who all work together to provide you with the care you need, when you need it.  We recommend signing up for the patient portal called "MyChart".  Sign up information is provided on this After Visit Summary.  MyChart is used to connect with patients for Virtual Visits (Telemedicine).  Patients are able to view lab/test results,  encounter notes, upcoming appointments, etc.  Non-urgent messages can be sent to your provider as well.   To learn more about what you can do with MyChart, go to [email or web address].    Your next appointment:   3-4 week(s)  The format for your next appointment:   In Person  Provider:   Glenetta Hew, MD  or APP  Other Instructions

## 2021-02-25 NOTE — Progress Notes (Signed)
Cardiology Office Note:    Date:  02/25/2021   ID:  Phyllis Conner, DOB 03-22-1946, MRN [id number]  PCP:  Lucianne Lei, MD   [facility] Providers Cardiologist:  Glenetta Hew, MD     Referring MD: Lucianne Lei, MD   Chief Complaint: f/u chest pain   History of Present Illness:    Phyllis Conner is a 75 y.o. female with a hx of NSTEMI, HTN, CAD with PCI DES to LAD/OM1 11/14, mild AS, PE, T2DM, hyperlipidemia, and obesity.  She was last seen in our office 11/21 by Dr. Ellyn Hack for her annual follow-up. At that visit, she was advised to switch from atorvastatin to rosuvastatin and started on losartan 25 mg for elevated BP. She was referred to [facility] for management of lipids and she was started on Repatha in addition to rosuvastatin 40 mg. On 01/23/21, she presented to [facility] for chest pain described as "soreness" that woke her from sleep at 3 AM, resolved without treatment and reoccurred later in the day. EKG did not show acute ischemia and there was low suspicion for ACS, PE, or dissection with negative troponins per ED MD, so plan was for observation or outpatient f/u and pt elected to follow-up as an outpatient.   Today, she is here alone for follow-up of her chest pain. She describes intermittent chest pain that has occurred on 3 separate occasions in the last month. She describes a discomfort that starts in upper chest and radiates into her neck, back, and right arm. Each time this discomfort has occurred after a meal. On one occasion, she took a nap and felt better when she woke up. She has not taken nitro. Pain free today. Has intermittent SOB that occurs with activity. She was able to get in to our office today without difficulty but describes recently increasing her pillows for sleep and waking up most nights over the past 2 weeks with breathlessness. Denies palpitations or feelings of heart racing. Has occasional dizziness with walking and feels like she will lose her balance. Has  caught herself from falling on multiple occasions but fell out of the shower 2 weeks ago striking her head and shoulder. She had a headache for a few days but reports it has resolved. Has a history of vertigo but thinks this feels different. Denies syncope. Does not describe dizziness with change of position. Does not monitor BP at home on a consistent basis.   Past Medical History:  Diagnosis Date   Acid reflux    Anemia    "when I was younger"   Aortic stenosis    mild AS 09/14/17 echo   Arthritis    CAD S/P percutaneous coronary angioplasty November 2014   Mid LAD 2.5 mm x 12 mm Promus DES (2.70 mm); proximal OM1 - Promus DES 2.75 mm x 16 mm (2.85 mm)   Diabetes mellitus type 2 in obese [facility])    Essential hypertension    Headache    Hyperlipidemia with target LDL less than 70    Non-Q wave ST elevation myocardial infarction (STEMI) involving left anterior descending (LAD) coronary artery November 2014   PCI to LAD and circumflex   Obesity (BMI 30-39.9) 05/05/2013    Past Surgical History:  Procedure Laterality Date   ABDOMINAL HYSTERECTOMY  1990s   Arthroscopic knee surgery Bilateral 1984, 2002   Arthroscopic   CARDIAC CATHETERIZATION  November 2014   Mid LAD 95%, proximal OM1 80-90% --> two-vessel PCI   COLONOSCOPY  N/A 12/05/2013   Procedure: COLONOSCOPY;  Surgeon: Beryle Beams, MD;  Location: [facility] ENDOSCOPY;  Service: Endoscopy;  Laterality: N/A;   COLONOSCOPY WITH PROPOFOL N/A 12/11/2014   Procedure: COLONOSCOPY WITH PROPOFOL;  Surgeon: Carol Ada, MD;  Location: [facility] ENDOS[facility];  Service: Endoscopy;  Laterality: N/A;   ESOPHAGOGASTRODUODENOSCOPY N/A 12/05/2013   Procedure: ESOPHAGOGASTRODUODENOSCOPY (EGD);  Surgeon: Beryle Beams, MD;  Location: [facility];  Service: Endoscopy;  Laterality: N/A;   LEFT HEART CATHETERIZATION WITH CORONARY ANGIOGRAM N/A 02/17/2013   Procedure: LEFT HEART CATHETERIZATION WITH CORONARY ANGIOGRAM;  Surgeon: Sanda Klein, MD;  Location: [facility]ATH [facility];   Service: Cardiovascular;  Laterality: N/A;   PERCUTANEOUS CORONARY STENT INTERVENTION (PCI-S)  November 2014   Mid LAD 2.5 mm x 12 mm (2.75 mm) Promus DES; OM1 2.75 mm x 60 mm (2.85 mm) Promus DES   TOTAL HIP ARTHROPLASTY Left 10/17/2017   TOTAL HIP ARTHROPLASTY Left 10/17/2017   Procedure: TOTAL HIP ARTHROPLASTY ANTERIOR APPROACH;  Surgeon: [name], MD;  Location: [facility];  Service: Orthopedics;  Laterality: Left;   TRANSTHORACIC ECHOCARDIOGRAM  09/2017    Normal EF 55-60%. No RWMA.  Gr 1-2DD.  Mild AS. (mean gradient 21 mmHg)     Current Medications: Current Meds  Medication Sig   ACCU-CHEK AVIVA PLUS test strip 1 each by Other route 2 (two) times daily.    amLODipine (NORVASC) 10 MG tablet TAKE 1 TABLET BY MOUTH EVERY DAY   aspirin EC 81 MG tablet Take 81 mg by mouth daily. Swallow whole.   citalopram (CELEXA) 20 MG tablet Take 20 mg by mouth daily.   Evolocumab with Infusor (Venturia) 420 MG/3.5ML SOCT Inject 420 mg into the skin every 30 (thirty) days.   furosemide (LASIX) 20 MG tablet Take 1 tablet (20 mg total) by mouth daily.   HUMALOG 100 UNIT/ML injection Inject 20 Units into the skin continuous.    Insulin Disposable Pump (V-GO 20) KIT Humalog   isosorbide mononitrate (IMDUR) 30 MG 24 hr tablet TAKE 1 TABLET(30 MG) BY MOUTH DAILY   losartan (COZAAR) 25 MG tablet Take 1 tablet (25 mg total) by mouth daily.   metFORMIN (GLUCOPHAGE) 500 MG tablet Take 1 tablet (500 mg total) by mouth daily. (Patient taking differently: Take 2,000 mg by mouth 2 (two) times daily.)   metoprolol tartrate (LOPRESSOR) 25 MG tablet TAKE 1 TABLET BY MOUTH TWICE DAILY   nitroGLYCERIN (NITROSTAT) 0.4 MG SL tablet Place 1 tablet (0.4 mg total) under the tongue every 5 (five) minutes x 3 doses as needed for chest pain.   potassium chloride SA (K-DUR,KLOR-CON) 20 MEQ tablet Take 20 mEq by mouth daily.    rosuvastatin (CRESTOR) 40 MG tablet Take 1 tablet (40 mg total) by mouth daily.    traMADol (ULTRAM) 50 MG tablet TK 1 T PO BID   TRULICITY 1.5 QI/3.4VQ SOPN Inject 1.5 mg into the skin once a week. Takes every Monday   [DISCONTINUED] hydrochlorothiazide (HYDRODIURIL) 25 MG tablet TAKE 1 TABLET(25 MG) BY MOUTH DAILY     Allergies:   Iohexol   Social History   Socioeconomic History   Marital status: Single    Spouse name: Not on file   Number of children: Not on file   Years of education: Not on file   Highest education level: Not on file  Occupational History   Not on file  Tobacco Use   Smoking status: Never   Smokeless tobacco: Never  Vaping Use   Vaping Use:  Never used  Substance and Sexual Activity   Alcohol use: No   Drug use: No   Sexual activity: Not on file  Other Topics Concern   Not on file  Social History Narrative   She is married with 4 children.   She does not smoke and does not drink alcohol.   Social Determinants of Health   Financial Resource Strain: Not on file  Food Insecurity: Not on file  Transportation Needs: Not on file  Physical Activity: Not on file  Stress: Not on file  Social Connections: Not on file     Family History: The patient's family history includes Asthma in her mother; Cancer in her father; Heart attack in her mother; Heart disease in her brother, mother, and sister.  ROS:   Please see the history of present illness.  ++PND, orthopnea, SOB, dizziness. All other systems reviewed and are negative.  Labs/Other Studies Reviewed:    The following studies were reviewed today:  ECHO 6/19  - Left ventricle: The cavity size was normal. There was mild focal    basal hypertrophy of the septum. Systolic function was normal.    The estimated ejection fraction was in the range of 55% to 60%.    Wall motion was normal; there were no regional wall motion    abnormalities. Doppler parameters are consistent with abnormal    left ventricular relaxation (grade 1 diastolic dysfunction). The    E/e&' ratio is between 8-15,  suggesting indeterminate LV filling    pressure.  - Aortic valve: Mildly calcified leaflets. Mild stenosis. Mean    gradient (S): 21 mm Hg. Peak gradient (S): 32 mm Hg. Valve area    (VTI): 1.57 cm^2. Valve area (Vmax): 1.69 cm^2. Valve area    (Vmean): 1.51 cm^2.  - Mitral valve: Mildly thickened leaflets . There was trivial    regurgitation.  - Left atrium: The atrium was normal in size.  - Tricuspid valve: There was trivial regurgitation.  - Pulmonary arteries: PA peak pressure: 20 mm Hg (S).  - Inferior vena cava: The vessel was normal in size. The    respirophasic diameter changes were in the normal range (>= 50%),    consistent with normal central venous pressure.  Impressions:   - Compared to a prior study in 2014, the LVEF is higher at 55-60%.    There is mild aortic valve stenosis with mean gradient of 21 mmHg    and AVA around 1.6 cm2.    Recent Labs: 01/23/2021: BUN 30; Creatinine, Ser 1.65; Hemoglobin 10.5; Platelets 189; Potassium 3.5; Sodium 135   Recent Lipid Panel [facility] 01/17/21 LDL 69, HDL 55, Trigs 98 A1C 8   Physical Exam:    VS:  BP 112/62   Pulse 61   Ht 5' 5"  (1.651 m)   Wt 209 lb 3.2 oz (94.9 kg)   SpO2 99%   BMI 34.81 kg/m     Wt Readings from Last 3 Encounters:  02/25/21 209 lb 3.2 oz (94.9 kg)  01/23/21 218 lb (98.9 kg)  05/20/20 231 lb (104.8 kg)     GEN:  Well nourished, well developed in no acute distress HEENT: Normal NECK: No JVD; No carotid bruits LYMPHATICS: No lymphadenopathy CARDIAC: RRR, 2/6 murmur at left and right sternal border, no rubs, gallops RESPIRATORY:  Clear to auscultation without rales, wheezing or rhonchi  ABDOMEN: Soft, non-tender, non-distended MUSCULOSKELETAL:  No edema; No deformity  SKIN: Warm and dry NEUROLOGIC:  Alert and oriented x 3,  hand grips strong and equal bilaterally, able to follow all commands without difficulty  PSYCHIATRIC:  Normal affect   EKG:  EKG is not ordered today.    Diagnoses:    1.  Aortic stenosis, mild   2. Medication management   3. Hyperlipidemia associated with type 2 diabetes mellitus ([place])   4. Coronary artery disease involving native coronary artery of native heart without angina pectoris   5. Essential hypertension   6. Obesity (BMI 30-39.9)   7. Diabetes mellitus type 2 in obese ([place])   8. Chest pain of uncertain etiology   9. Orthopnea   10. PND (paroxysmal nocturnal dyspnea)   11. Nonspecific dizziness    Assessment and Plan:    Chest pain, uncertain etiology: She has a history of CAD with DES x 2 in 2014. No pain today. Recent chest pain episodes x 3 occurring after meals and resolves without treatment and on one occasion after rest. Symptoms do not sound consistent with angina. She also has SOB described below. Will try Pepcid for symptom relief. Continue CCB, beta blocker, Imdur. Will order updated echo for further evaluation and have her return in 4-6 weeks for follow-up. Reviewed ER precautions and use of SL NTG which she has but has not used.  Orthopnea/PND: Recent onset 2-3 weeks ago. She has increased number of pillows she is sleeping on and has PND on most nights. Reports she was able to walk into the office today from the street without difficulty and can walk several aisles of the grocery store without SOB but has difficulty when trying to sleep on most nights. She is not exercising. Does not associate the SOB with the chest pain. Will update her echo and get bnp, bmet today. Will d/c HCTZ and start furosemide 20 mg. Plan for return visit in 4-6 weeks.   CAD native w/history of DES x 2: She is having some intermittent chest pain, approximately 3 episodes over the last month. Each of these episodes have occurred after eating. She is having some SOB which we will work that up as that could be an angina equivalent. Continue Imdur, beta blocker, statin, PCSK9-I, aspirin. Advised use of OTC histamine blocker such as Pepcid to see if symptoms resolve. Can  consider further ischemic evaluation if echo is abnormal or patient has worsening symptoms.   Aortic stenosis: Mild AS with mean gradient 21 mmHg June 2019. Systolic murmur 2/6 auscultated at right and left sternal borders. She is having some dizziness and chest pain so we will update her echocardiogram.   Dizziness: Her dizziness and loss of balance is occurring frequently and is concerning. No carotid bruit heard on auscultation. She does not report palpitations or heart racing. Neurological exam in intact. BP stable here. Reports good hydration. We discussed potential causes including orthostatic hypotension or neuropathy. Encouraged her to monitor BP at home and to let us know if she is getting readings of <270 systolic. Advised her to follow-up with PCP for this as well as we will see if echo offers any indication or if she calls with low BP readings.  Hyperlipidemia: She has had a lot of difficulty getting her Repatha injector to work properly. PharmD demonstrated use of injector in person. The patient said this was very helpful. Will have her get repeat lipids/liver when she returns for office visit in 4-6 weeks. Continue rosuvastatin.   Essential hypertension: BP is well-controlled. She is not monitoring consistently at home. Asked her to record frequent BP readings to  determine if she is having any orthostasis in the setting of dizziness.   Disposition: 3-4 week follow-up. If work-up is largely negative, could consider cardiac monitor and carotid u/s.   Medication Adjustments/Labs and Tests Ordered: Current medicines are reviewed at length with the patient today.  Concerns regarding medicines are outlined above.   Orders Placed This Encounter  Procedures   Basic metabolic panel   Lipid panel   Hepatic function panel   Brain natriuretic peptide   ECHOCARDIOGRAM COMPLETE    Meds ordered this encounter  Medications   furosemide (LASIX) 20 MG tablet    Sig: Take 1 tablet (20 mg  total) by mouth daily.    Dispense:  90 tablet    Refill:  3     Patient Instructions  Medication Instructions:  Buy Over the Counter Pepcid   STOP Hydrochlorothiazide (HTCZ) START Lasix 20 mg daily  *If you need a refill on your cardiac medications before your next appointment, please call your pharmacy*  Lab Work: Your physician recommends that you return for lab work TODAY:  BMET BNP  Your physician recommends that you return for lab work at follow up appointment:  Fasting Lipid Panel-DO NOT EAT OR DRINK PAST MIDNIGHT. OKAY TO HAVE WATER. Hepatic (Liver) Function Test   If you have labs (blood work) drawn today and your tests are completely normal, you will receive your results only by: MyChart Message (if you have MyChart) OR A paper copy in the mail If you have any lab test that is abnormal or we need to change your treatment, we will call you to review the results.  Testing/Procedures: Your physician has requested that you have an echocardiogram. Echocardiography is a painless test that uses sound waves to create images of your heart. It provides your doctor with information about the size and shape of your heart and how well your heart's chambers and valves are working. This procedure takes approximately one hour. There are no restrictions for this procedure.  Please schedule for 1-2 weeks   Follow-Up: At [facility], you and your health needs are our priority.  As part of our continuing mission to provide you with exceptional heart care, we have created designated Provider Care Teams.  These Care Teams include your primary Cardiologist (physician) and Advanced Practice Providers (APPs -  Physician Assistants and Nurse Practitioners) who all work together to provide you with the care you need, when you need it.  We recommend signing up for the patient portal called "MyChart".  Sign up information is provided on this After Visit Summary.  MyChart is used to connect with  patients for Virtual Visits (Telemedicine).  Patients are able to view lab/test results, encounter notes, upcoming appointments, etc.  Non-urgent messages can be sent to your provider as well.   To learn more about what you can do with MyChart, go to [email or web address].    Your next appointment:   3-4 week(s)  The format for your next appointment:   In Person  Provider:   Glenetta Hew, MD  or APP  Other Instructions    Signed, Emmaline Life, NP  02/25/2021 1:07 PM    [place]

## 2021-02-26 LAB — BASIC METABOLIC PANEL
BUN/Creatinine Ratio: 16 (ref 12–28)
BUN: 36 mg/dL — ABNORMAL HIGH (ref 8–27)
CO2: 24 mmol/L (ref 20–29)
Calcium: 9.6 mg/dL (ref 8.7–10.3)
Chloride: 97 mmol/L (ref 96–106)
Creatinine, Ser: 2.25 mg/dL — ABNORMAL HIGH (ref 0.57–1.00)
Glucose: 122 mg/dL — ABNORMAL HIGH (ref 70–99)
Potassium: 3.1 mmol/L — ABNORMAL LOW (ref 3.5–5.2)
Sodium: 139 mmol/L (ref 134–144)
eGFR: 22 mL/min/{1.73_m2} — ABNORMAL LOW (ref >59)

## 2021-02-26 LAB — BRAIN NATRIURETIC PEPTIDE: BNP: 21.8 pg/mL (ref 0.0–100.0)

## 2021-02-28 DIAGNOSIS — E1169 Type 2 diabetes mellitus with other specified complication: Secondary | ICD-10-CM | POA: Diagnosis not present

## 2021-02-28 DIAGNOSIS — I10 Essential (primary) hypertension: Secondary | ICD-10-CM | POA: Diagnosis not present

## 2021-03-01 ENCOUNTER — Telehealth (HOSPITAL_BASED_OUTPATIENT_CLINIC_OR_DEPARTMENT_OTHER): Payer: Self-pay | Admitting: Nurse Practitioner

## 2021-03-01 DIAGNOSIS — E876 Hypokalemia: Secondary | ICD-10-CM

## 2021-03-01 DIAGNOSIS — I10 Essential (primary) hypertension: Secondary | ICD-10-CM

## 2021-03-01 NOTE — Telephone Encounter (Signed)
Called patient this morning to get an update following her visit to PCP on 02/28/21. She states Dr. Criss Rosales was unable to view our office visit but she did not make any medication changes and did not order any lab work. I asked patient to go to the [facility] in [place] today for repeat bmet. I advised that we will review that lab work for additional medication changes. Patient verbalized understanding and agreement and thanked me for the call.

## 2021-03-02 ENCOUNTER — Telehealth: Payer: Self-pay

## 2021-03-02 NOTE — Telephone Encounter (Signed)
Spoke with someone at patient's PCP. She will send a message over to the nurse to check on lab work and have them call us back.

## 2021-03-02 NOTE — Telephone Encounter (Signed)
Attempted to call patient to see if she had lab work done. Unable to leave voicemail.  Attempted to call patient's PCP office to check if she had her lab work done there. No answer and unable to leave voicemail.  Will attempt later.

## 2021-03-02 NOTE — Telephone Encounter (Signed)
-----   Message from Thayer Headings, MD sent at 03/02/2021  8:33 AM EST ----- Good morning Triage.  Ms. Atha was supposed to have some repeat labs yesterday -   Sharyn Lull asked me to check but I dont see any labs Can you check and see what was ordered .  She may need a reminder phone call   Thanks  PN  ----- Message ----- From: Interface, [facility] Lab Results In Sent: 02/26/2021   1:40 AM EST To: Emmaline Life, NP

## 2021-03-07 NOTE — Telephone Encounter (Signed)
Spoke with the patient who states that she is going to go to [facility] 11/29 to have her blood work done.

## 2021-03-08 ENCOUNTER — Other Ambulatory Visit (HOSPITAL_COMMUNITY)
Admission: RE | Admit: 2021-03-08 | Discharge: 2021-03-08 | Disposition: A | Discharge status: Home / Self Care | Payer: Medicare Other | Source: Ambulatory Visit | Attending: Nurse Practitioner | Admitting: Nurse Practitioner

## 2021-03-08 DIAGNOSIS — E1169 Type 2 diabetes mellitus with other specified complication: Secondary | ICD-10-CM | POA: Insufficient documentation

## 2021-03-08 DIAGNOSIS — I10 Essential (primary) hypertension: Secondary | ICD-10-CM | POA: Diagnosis present

## 2021-03-08 DIAGNOSIS — E785 Hyperlipidemia, unspecified: Secondary | ICD-10-CM | POA: Diagnosis not present

## 2021-03-08 DIAGNOSIS — E876 Hypokalemia: Secondary | ICD-10-CM | POA: Diagnosis present

## 2021-03-08 LAB — HEPATIC FUNCTION PANEL
ALT: 10 U/L (ref 0–44)
AST: 13 U/L — ABNORMAL LOW (ref 15–41)
Albumin: 3.4 g/dL — ABNORMAL LOW (ref 3.5–5.0)
Alkaline Phosphatase: 67 U/L (ref 38–126)
Bilirubin, Direct: 0.1 mg/dL (ref 0.0–0.2)
Indirect Bilirubin: 0.3 mg/dL (ref 0.3–0.9)
Total Bilirubin: 0.4 mg/dL (ref 0.3–1.2)
Total Protein: 7.4 g/dL (ref 6.5–8.1)

## 2021-03-08 LAB — BASIC METABOLIC PANEL
Anion gap: 8 (ref 5–15)
BUN: 21 mg/dL (ref 8–23)
CO2: 24 mmol/L (ref 22–32)
Calcium: 9 mg/dL (ref 8.9–10.3)
Chloride: 109 mmol/L (ref 98–111)
Creatinine, Ser: 1.49 mg/dL — ABNORMAL HIGH (ref 0.44–1.00)
GFR, Estimated: 37 mL/min — ABNORMAL LOW (ref >60)
Glucose, Bld: 85 mg/dL (ref 70–99)
Potassium: 3.1 mmol/L — ABNORMAL LOW (ref 3.5–5.1)
Sodium: 141 mmol/L (ref 135–145)

## 2021-03-08 LAB — LIPID PANEL
Cholesterol: 93 mg/dL (ref 0–200)
HDL: 44 mg/dL (ref >40)
LDL Cholesterol: 34 mg/dL (ref 0–99)
Total CHOL/HDL Ratio: 2.1 RATIO
Triglycerides: 74 mg/dL (ref <150)
VLDL: 15 mg/dL (ref 0–40)

## 2021-03-09 ENCOUNTER — Other Ambulatory Visit (HOSPITAL_BASED_OUTPATIENT_CLINIC_OR_DEPARTMENT_OTHER): Payer: Self-pay | Admitting: Nurse Practitioner

## 2021-03-09 DIAGNOSIS — E876 Hypokalemia: Secondary | ICD-10-CM

## 2021-03-09 DIAGNOSIS — I10 Essential (primary) hypertension: Secondary | ICD-10-CM

## 2021-03-10 ENCOUNTER — Other Ambulatory Visit (HOSPITAL_BASED_OUTPATIENT_CLINIC_OR_DEPARTMENT_OTHER): Payer: Self-pay

## 2021-03-10 ENCOUNTER — Other Ambulatory Visit (HOSPITAL_BASED_OUTPATIENT_CLINIC_OR_DEPARTMENT_OTHER): Payer: Medicare Other

## 2021-03-10 DIAGNOSIS — E876 Hypokalemia: Secondary | ICD-10-CM

## 2021-03-10 DIAGNOSIS — I10 Essential (primary) hypertension: Secondary | ICD-10-CM

## 2021-03-10 MED ORDER — SPIRONOLACTONE 25 MG PO TABS: 25.0000 mg | ORAL_TABLET | Freq: Every day | ORAL | 0 refills | Status: DC

## 2021-03-10 NOTE — Telephone Encounter (Signed)
Called pt. Reviewed lab results. States no changes to meds have been made by Dr. Criss Rosales.   Informed pt of new prescription and desire to get new labs, she is agreeable  New prescription sent and lab slips to be mailed to pt.

## 2021-03-10 NOTE — Telephone Encounter (Signed)
-----   Message from Emmaline Life, NP sent at 03/08/2021  4:44 PM EST ----- Creatinine is better but potassium remains low. Is she still taking Lasix 20 mg? She needs to take potassium chloride 40 mEq x 1 day then 20 mEq daily as long as she is taking Lasix.

## 2021-03-21 ENCOUNTER — Ambulatory Visit (INDEPENDENT_AMBULATORY_CARE_PROVIDER_SITE_OTHER): Payer: Medicare Other

## 2021-03-21 ENCOUNTER — Other Ambulatory Visit: Payer: Self-pay

## 2021-03-21 DIAGNOSIS — I350 Nonrheumatic aortic (valve) stenosis: Secondary | ICD-10-CM

## 2021-03-21 DIAGNOSIS — I10 Essential (primary) hypertension: Secondary | ICD-10-CM | POA: Diagnosis not present

## 2021-03-21 DIAGNOSIS — E876 Hypokalemia: Secondary | ICD-10-CM | POA: Diagnosis not present

## 2021-03-21 LAB — ECHOCARDIOGRAM COMPLETE
AR max vel: 1.51 cm2
AV Area VTI: 1.77 cm2
AV Area mean vel: 1.53 cm2
AV Mean grad: 8 mmHg
AV Peak grad: 17.3 mmHg
Ao pk vel: 2.08 m/s
Area-P 1/2: 2.78 cm2
Calc EF: 64.2 %
S' Lateral: 2.27 cm
Single Plane A2C EF: 67.8 %
Single Plane A4C EF: 59.5 %

## 2021-03-22 ENCOUNTER — Telehealth: Payer: Self-pay

## 2021-03-22 DIAGNOSIS — N289 Disorder of kidney and ureter, unspecified: Secondary | ICD-10-CM

## 2021-03-22 LAB — BASIC METABOLIC PANEL
BUN/Creatinine Ratio: 14 (ref 12–28)
BUN: 24 mg/dL (ref 8–27)
CO2: 22 mmol/L (ref 20–29)
Calcium: 9.5 mg/dL (ref 8.7–10.3)
Chloride: 104 mmol/L (ref 96–106)
Creatinine, Ser: 1.69 mg/dL — ABNORMAL HIGH (ref 0.57–1.00)
Glucose: 92 mg/dL (ref 70–99)
Potassium: 3.9 mmol/L (ref 3.5–5.2)
Sodium: 142 mmol/L (ref 134–144)
eGFR: 32 mL/min/{1.73_m2} — ABNORMAL LOW (ref >59)

## 2021-03-22 NOTE — Telephone Encounter (Signed)
The patient has been notified of the result and verbalized understanding.  All questions (if any) were answered. Antonieta Iba, RN 03/22/2021 11:59 AM  Patient does not currently see a nephrologist. Referral has been placed.

## 2021-03-22 NOTE — Telephone Encounter (Signed)
-----   Message from Emmaline Life, NP sent at 03/22/2021  7:43 AM EST ----- Potassium is better on spironolactone. Creatinine increased slightly but remains consistent with baseline. If she is not already seeing nephrololgy, I would like to refer her. See also echocardiogram result.

## 2021-03-24 ENCOUNTER — Other Ambulatory Visit: Payer: Self-pay | Admitting: Cardiology

## 2021-03-27 NOTE — Progress Notes (Deleted)
Cardiology Office Note:    Date:  03/27/2021   ID:  CHRISTINA GINTZ, DOB [birth year], MRN [id number]  PCP:  Lucianne Lei, MD   [facility] Cardiologist:  Glenetta Hew, MD     Referring MD: Lucianne Lei, MD   Chief Complaint:   History of Present Illness:    Phyllis Conner is a 75 y.o. female with a hx of NSTEMI, HTN, CAD with PCI DES to LAD/OM1 11/14, mild AS, PE, T2DM, hyperlipidemia, and obesity.  On 01/23/21, she presented to [facility] for chest pain described as "soreness" that woke her from sleep at 3 AM, resolved without treatment and reoccurred later in the day. EKG did not show acute ischemia and there was low suspicion for ACS, PE, or dissection with negative troponins per ED MD, so plan was for observation or outpatient f/u and pt elected to follow-up as an outpatient.   Today, she is here alone for follow-up of her chest pain. She describes intermittent chest pain that has occurred on 3 separate occasions in the last month. She describes a discomfort that starts in upper chest and radiates into her neck, back, and right arm. Each time this discomfort has occurred after a meal. On one occasion, she took a nap and felt better when she woke up. She has not taken nitro. Pain free today. Has intermittent SOB that occurs with activity. She was able to get in to our office today without difficulty but describes recently increasing her pillows for sleep and waking up most nights over the past 2 weeks with breathlessness. Denies palpitations or feelings of heart racing. Has occasional dizziness with walking and feels like she will lose her balance. Has caught herself from falling on multiple occasions but fell out of the shower 2 weeks ago striking her head and shoulder. She had a headache for a few days but reports it has resolved. Has a history of vertigo but thinks this feels different. Denies syncope. Does not describe dizziness with change of position. Does not monitor BP at  home on a consistent basis.   Nephrology referral?  Past Medical History:  Diagnosis Date   Acid reflux    Anemia    "when I was younger"   Aortic stenosis    mild AS 09/14/17 echo   Arthritis    CAD S/P percutaneous coronary angioplasty November 2014   Mid LAD 2.5 mm x 12 mm Promus DES (2.70 mm); proximal OM1 - Promus DES 2.75 mm x 16 mm (2.85 mm)   Diabetes mellitus type 2 in obese [facility])    Essential hypertension    Headache    Hyperlipidemia with target LDL less than 70    Non-Q wave ST elevation myocardial infarction (STEMI) involving left anterior descending (LAD) coronary artery November 2014   PCI to LAD and circumflex   Obesity (BMI 30-39.9) 05/05/2013    Past Surgical History:  Procedure Laterality Date   ABDOMINAL HYSTERECTOMY  1990s   Arthroscopic knee surgery Bilateral 1984, 2002   Arthroscopic   CARDIAC CATHETERIZATION  November 2014   Mid LAD 95%, proximal OM1 80-90% --> two-vessel PCI   COLONOSCOPY N/A 12/05/2013   Procedure: COLONOSCOPY;  Surgeon: Beryle Beams, MD;  Location: [facility];  Service: Endoscopy;  Laterality: N/A;   COLONOSCOPY WITH PROPOFOL N/A 12/11/2014   Procedure: COLONOSCOPY WITH PROPOFOL;  Surgeon: Carol Ada, MD;  [facility];  Service: Endoscopy;  Laterality: N/A;   ESOPHAGOGASTRODUODENOSCOPY N/A 12/05/2013  Procedure: ESOPHAGOGASTRODUODENOSCOPY (EGD);  Surgeon: Beryle Beams, MD;  Location: [facility];  Service: Endoscopy;  Laterality: N/A;   LEFT HEART CATHETERIZATION WITH CORONARY ANGIOGRAM N/A 02/17/2013   Procedure: LEFT HEART CATHETERIZATION WITH CORONARY ANGIOGRAM;  Surgeon: Sanda Klein, MD;  Location: [facility] CATH LAB;  Service: Cardiovascular;  Laterality: N/A;   PERCUTANEOUS CORONARY STENT INTERVENTION (PCI-S)  November 2014   Mid LAD 2.5 mm x 12 mm (2.75 mm) Promus DES; OM1 2.75 mm x 60 mm (2.85 mm) Promus DES   TOTAL HIP ARTHROPLASTY Left 10/17/2017   TOTAL HIP ARTHROPLASTY Left 10/17/2017   Procedure: TOTAL HIP  ARTHROPLASTY ANTERIOR APPROACH;  Surgeon: [name], MD;  Location: [facility];  Service: Orthopedics;  Laterality: Left;   TRANSTHORACIC ECHOCARDIOGRAM  09/2017    Normal EF 55-60%. No RWMA.  Gr 1-2DD.  Mild AS. (mean gradient 21 mmHg)     Current Medications: No outpatient medications have been marked as taking for the 03/28/21 encounter (Appointment) with Ledora Bottcher, [place].     Allergies:   Iohexol   Social History   Socioeconomic History   Marital status: Single    Spouse name: Not on file   Number of children: Not on file   Years of education: Not on file   Highest education level: Not on file  Occupational History   Not on file  Tobacco Use   Smoking status: Never   Smokeless tobacco: Never  Vaping Use   Vaping Use: Never used  Substance and Sexual Activity   Alcohol use: No   Drug use: No   Sexual activity: Not on file  Other Topics Concern   Not on file  Social History Narrative   She is married with 4 children.   She does not smoke and does not drink alcohol.   Social Determinants of Health   Financial Resource Strain: Not on file  Food Insecurity: Not on file  Transportation Needs: Not on file  Physical Activity: Not on file  Stress: Not on file  Social Connections: Not on file     Family History: The patient's family history includes Asthma in her mother; Cancer in her father; Heart attack in her mother; Heart disease in her brother, mother, and sister.  ROS:   Please see the history of present illness.  ++PND, orthopnea, SOB, dizziness. All other systems reviewed and are negative.  Labs/Other Studies Reviewed:    The following studies were reviewed today:  Echo 03/21/21  Left Ventricle: Global longitudinal strain is -18.6%. Left ventricular  ejection fraction, by estimation, is 60 to 65%. The left ventricle has  normal function. The left ventricle has no regional wall motion  abnormalities. The left ventricular internal cavity size was  normal in size. There is no left ventricular hypertrophy.  Left ventricular diastolic parameters are indeterminate.  Right Ventricle: The right ventricular size is normal. Right vetricular  wall thickness was not assessed. Right ventricular systolic function is  normal.  Left Atrium: Left atrial size was normal in size.  Right Atrium: Right atrial size was normal in size.  Pericardium: There is no evidence of pericardial effusion.  Mitral Valve: The mitral valve is normal in structure. Trivial mitral  valve regurgitation.  Tricuspid Valve: The tricuspid valve is normal in structure. Tricuspid  valve regurgitation is trivial.  Aortic Valve: AV is thickened, calcified with mildly restricted motion.  Peak and mean gradients through the valve are 17 and 8 mm Hg respectively consistent with mild AS. The aortic  valve is tricuspid. Aortic valve regurgitation is not visualized. Aortic  valve mean gradient measures 8.0 mmHg. Aortic valve peak gradient measures 17.3 mmHg. Aortic valve area, by VTI measures 1.77 cm.  Pulmonic Valve: The pulmonic valve was normal in structure. Pulmonic valve regurgitation is mild.  Aorta: The aortic root and ascending aorta are structurally normal, with  no evidence of dilitation.  IAS/Shunts: No atrial level shunt detected by color flow Doppler.    ECHO 6/19  - Left ventricle: The cavity size was normal. There was mild focal    basal hypertrophy of the septum. Systolic function was normal.    The estimated ejection fraction was in the range of 55% to 60%.    Wall motion was normal; there were no regional wall motion    abnormalities. Doppler parameters are consistent with abnormal    left ventricular relaxation (grade 1 diastolic dysfunction). The    E/e&' ratio is between 8-15, suggesting indeterminate LV filling    pressure.  - Aortic valve: Mildly calcified leaflets. Mild stenosis. Mean    gradient (S): 21 mm Hg. Peak gradient (S): 32 mm Hg. Valve area     (VTI): 1.57 cm^2. Valve area (Vmax): 1.69 cm^2. Valve area    (Vmean): 1.51 cm^2.  - Mitral valve: Mildly thickened leaflets . There was trivial    regurgitation.  - Left atrium: The atrium was normal in size.  - Tricuspid valve: There was trivial regurgitation.  - Pulmonary arteries: PA peak pressure: 20 mm Hg (S).  - Inferior vena cava: The vessel was normal in size. The    respirophasic diameter changes were in the normal range (>= 50%),    consistent with normal central venous pressure.  Impressions:   - Compared to a prior study in 2014, the LVEF is higher at 55-60%.    There is mild aortic valve stenosis with mean gradient of 21 mmHg    and AVA around 1.6 cm2.    Recent Labs: 01/23/2021: Hemoglobin 10.5; Platelets 189 02/25/2021: BNP 21.8 03/08/2021: ALT 10 03/21/2021: BUN 24; Creatinine, Ser 1.69; Potassium 3.9; Sodium 142   Recent Lipid Panel [facility] 01/17/21 LDL 69, HDL 55, Trigs 98 A1C 8   Physical Exam:    VS:  There were no vitals taken for this visit.    Wt Readings from Last 3 Encounters:  02/25/21 209 lb 3.2 oz (94.9 kg)  01/23/21 218 lb (98.9 kg)  05/20/20 231 lb (104.8 kg)     GEN:  Well nourished, well developed in no acute distress HEENT: Normal NECK: No JVD; No carotid bruits LYMPHATICS: No lymphadenopathy CARDIAC: RRR, 2/6 murmur at left and right sternal border, no rubs, gallops RESPIRATORY:  Clear to auscultation without rales, wheezing or rhonchi  ABDOMEN: Soft, non-tender, non-distended MUSCULOSKELETAL:  No edema; No deformity  SKIN: Warm and dry NEUROLOGIC:  Alert and oriented x 3, hand grips strong and equal bilaterally, able to follow all commands without difficulty  PSYCHIATRIC:  Normal affect   EKG:  EKG is not ordered today.    Diagnoses:    No diagnosis found.  Assessment and Plan:    Chest pain, uncertain etiology: She has a history of CAD with DES x 2 in 2014. No pain today. Recent chest pain episodes x 3 occurring after meals  and resolves without treatment and on one occasion after rest. Symptoms do not sound consistent with angina. She also has SOB described below. Will try Pepcid for symptom relief. Continue CCB, beta blocker, Imdur.  Will order updated echo for further evaluation and have her return in 4-6 weeks for follow-up. Reviewed ER precautions and use of SL NTG which she has but has not used.  Orthopnea/PND: Recent onset 2-3 weeks ago. She has increased number of pillows she is sleeping on and has PND on most nights. Reports she was able to walk into the office today from the street without difficulty and can walk several aisles of the grocery store without SOB but has difficulty when trying to sleep on most nights. She is not exercising. Does not associate the SOB with the chest pain. Will update her echo and get bnp, bmet today. Will d/c HCTZ and start furosemide 20 mg. Plan for return visit in 4-6 weeks.   CAD native w/history of DES x 2: She is having some intermittent chest pain, approximately 3 episodes over the last month. Each of these episodes have occurred after eating. She is having some SOB which we will work that up as that could be an angina equivalent. Continue Imdur, beta blocker, statin, PCSK9-I, aspirin. Advised use of OTC histamine blocker such as Pepcid to see if symptoms resolve. Can consider further ischemic evaluation if echo is abnormal or patient has worsening symptoms.   Aortic stenosis: Mild AS with mean gradient 21 mmHg June 2019. Systolic murmur 2/6 auscultated at right and left sternal borders. She is having some dizziness and chest pain so we will update her echocardiogram.   Dizziness: Her dizziness and loss of balance is occurring frequently and is concerning. No carotid bruit heard on auscultation. She does not report palpitations or heart racing. Neurological exam in intact. BP stable here. Reports good hydration. We discussed potential causes including orthostatic hypotension or  neuropathy. Encouraged her to monitor BP at home and to let us know if she is getting readings of <174 systolic. Advised her to follow-up with PCP for this as well as we will see if echo offers any indication or if she calls with low BP readings.  Hyperlipidemia: She has had a lot of difficulty getting her Repatha injector to work properly. PharmD demonstrated use of injector in person. The patient said this was very helpful. Will have her get repeat lipids/liver when she returns for office visit in 4-6 weeks. Continue rosuvastatin.   Essential hypertension: BP is well-controlled. She is not monitoring consistently at home. Asked her to record frequent BP readings to determine if she is having any orthostasis in the setting of dizziness.   Disposition: 3-4 week follow-up. If work-up is largely negative, could consider cardiac monitor and carotid u/s.   Medication Adjustments/Labs and Tests Ordered: Current medicines are reviewed at length with the patient today.  Concerns regarding medicines are outlined above.   No orders of the defined types were placed in this encounter.   No orders of the defined types were placed in this encounter.    There are no Patient Instructions on file for this visit.   Signed, Emmaline Life, NP  03/27/2021 7:37 PM    [facility]

## 2021-03-28 ENCOUNTER — Ambulatory Visit: Payer: Medicare Other | Admitting: Physician Assistant

## 2021-04-07 ENCOUNTER — Other Ambulatory Visit: Payer: Self-pay | Admitting: Cardiology

## 2021-04-09 ENCOUNTER — Other Ambulatory Visit: Payer: Self-pay | Admitting: Cardiology

## 2021-05-05 DIAGNOSIS — R069 Unspecified abnormalities of breathing: Secondary | ICD-10-CM | POA: Diagnosis not present

## 2021-05-05 DIAGNOSIS — E1169 Type 2 diabetes mellitus with other specified complication: Secondary | ICD-10-CM | POA: Diagnosis not present

## 2021-05-05 DIAGNOSIS — R69 Illness, unspecified: Secondary | ICD-10-CM | POA: Diagnosis not present

## 2021-05-05 DIAGNOSIS — I350 Nonrheumatic aortic (valve) stenosis: Secondary | ICD-10-CM | POA: Diagnosis not present

## 2021-05-06 ENCOUNTER — Other Ambulatory Visit: Payer: Self-pay | Admitting: Cardiology

## 2021-05-23 ENCOUNTER — Other Ambulatory Visit: Payer: Self-pay | Admitting: Nephrology

## 2021-05-23 ENCOUNTER — Other Ambulatory Visit (HOSPITAL_COMMUNITY): Payer: Self-pay | Admitting: Nephrology

## 2021-05-23 DIAGNOSIS — N1832 Chronic kidney disease, stage 3b: Secondary | ICD-10-CM | POA: Diagnosis not present

## 2021-05-23 DIAGNOSIS — I129 Hypertensive chronic kidney disease with stage 1 through stage 4 chronic kidney disease, or unspecified chronic kidney disease: Secondary | ICD-10-CM | POA: Diagnosis not present

## 2021-05-23 DIAGNOSIS — N390 Urinary tract infection, site not specified: Secondary | ICD-10-CM | POA: Diagnosis not present

## 2021-05-23 DIAGNOSIS — E1122 Type 2 diabetes mellitus with diabetic chronic kidney disease: Secondary | ICD-10-CM | POA: Diagnosis not present

## 2021-05-31 ENCOUNTER — Ambulatory Visit (HOSPITAL_COMMUNITY)
Admission: RE | Admit: 2021-05-31 | Discharge: 2021-05-31 | Disposition: A | Discharge status: Home / Self Care | Payer: Medicare Other | Source: Ambulatory Visit | Attending: Nephrology | Admitting: Nephrology

## 2021-05-31 ENCOUNTER — Other Ambulatory Visit: Payer: Self-pay

## 2021-05-31 DIAGNOSIS — I129 Hypertensive chronic kidney disease with stage 1 through stage 4 chronic kidney disease, or unspecified chronic kidney disease: Secondary | ICD-10-CM | POA: Insufficient documentation

## 2021-05-31 DIAGNOSIS — N1832 Chronic kidney disease, stage 3b: Secondary | ICD-10-CM | POA: Diagnosis not present

## 2021-05-31 DIAGNOSIS — N281 Cyst of kidney, acquired: Secondary | ICD-10-CM | POA: Diagnosis not present

## 2021-05-31 DIAGNOSIS — N189 Chronic kidney disease, unspecified: Secondary | ICD-10-CM | POA: Diagnosis not present

## 2021-06-05 ENCOUNTER — Other Ambulatory Visit (HOSPITAL_BASED_OUTPATIENT_CLINIC_OR_DEPARTMENT_OTHER): Payer: Self-pay | Admitting: Nurse Practitioner

## 2021-06-17 ENCOUNTER — Other Ambulatory Visit: Payer: Self-pay | Admitting: Cardiology

## 2021-07-03 ENCOUNTER — Other Ambulatory Visit: Payer: Self-pay | Admitting: Cardiology

## 2021-07-04 ENCOUNTER — Other Ambulatory Visit: Payer: Self-pay | Admitting: Cardiology

## 2021-08-18 ENCOUNTER — Other Ambulatory Visit: Payer: Self-pay | Admitting: Cardiology

## 2021-08-22 ENCOUNTER — Other Ambulatory Visit: Payer: Self-pay | Admitting: Cardiology

## 2021-08-22 DIAGNOSIS — I10 Essential (primary) hypertension: Secondary | ICD-10-CM | POA: Diagnosis not present

## 2021-08-22 DIAGNOSIS — E1122 Type 2 diabetes mellitus with diabetic chronic kidney disease: Secondary | ICD-10-CM | POA: Diagnosis not present

## 2021-08-22 DIAGNOSIS — Z0000 Encounter for general adult medical examination without abnormal findings: Secondary | ICD-10-CM | POA: Diagnosis not present

## 2021-08-22 DIAGNOSIS — E1169 Type 2 diabetes mellitus with other specified complication: Secondary | ICD-10-CM | POA: Diagnosis not present

## 2021-08-22 DIAGNOSIS — I350 Nonrheumatic aortic (valve) stenosis: Secondary | ICD-10-CM | POA: Diagnosis not present

## 2021-09-02 DIAGNOSIS — I10 Essential (primary) hypertension: Secondary | ICD-10-CM | POA: Diagnosis not present

## 2021-09-02 DIAGNOSIS — E1169 Type 2 diabetes mellitus with other specified complication: Secondary | ICD-10-CM | POA: Diagnosis not present

## 2021-09-02 DIAGNOSIS — E785 Hyperlipidemia, unspecified: Secondary | ICD-10-CM | POA: Diagnosis not present

## 2021-09-06 NOTE — Progress Notes (Unsigned)
Cardiology Office Note:    Date:  09/07/2021   ID:  Phyllis Conner, DOB 28-Jun-1945, MRN [id number]  PCP:  Phyllis Conner, [place] Cardiologist: Phyllis Hew, MD   Reason for visit: 5-monthfollow-up  History of Present Illness:    Phyllis COURTNEYis a 76y.o. female with a hx of NSTEMI, HTN, CAD with PCI DES to LAD/OM1 11/14, mild AS, PE, T2DM, hyperlipidemia, and obesity.    She was seen by MChristen BameNP in November 2022 for follow-up and chest pain.  Chest pain typically occurred after a meal.  Pepcid recommended for symptom relief.  Shortness of breath, orthopnea, PND, 2D echo ordered.  HCTZ was discontinued and patient was started on Lasix 20 mg.  2D echo showed EF 60-65%, no WMA, mild aortic stenosis.  She was referred to nephrology for CKD.  Today, she feels well.  She denies chest pain, PND, lower extremity edema and significant lightheadedness.  She has slight shortness of breath if she gets in a hurry.  She states her PCP had a long conversation with her at her last visit with hemoglobin A1c of 11.9%.  She blames the holidays for weight gain.  She is planning to get back on track.   Past Medical History:  Diagnosis Date   Acid reflux    Anemia    "when I was younger"   Aortic stenosis    mild AS 09/14/17 echo   Arthritis    CAD S/P percutaneous coronary angioplasty November 2014   Mid LAD 2.5 mm x 12 mm Promus DES (2.70 mm); proximal OM1 - Promus DES 2.75 mm x 16 mm (2.85 mm)   Diabetes mellitus type 2 in obese ([facility]    Essential hypertension    Headache    Hyperlipidemia with target LDL less than 70    Non-Q wave ST elevation myocardial infarction (STEMI) involving left anterior descending (LAD) coronary artery November 2014   PCI to LAD and circumflex   Obesity (BMI 30-39.9) 05/05/2013    Past Surgical History:  Procedure Laterality Date   ABDOMINAL HYSTERECTOMY  1990s   Arthroscopic knee surgery Bilateral 1984, 2002   Arthroscopic   CARDIAC  CATHETERIZATION  November 2014   Mid LAD 95%, proximal OM1 80-90% --> two-vessel PCI   COLONOSCOPY N/A 12/05/2013   Procedure: COLONOSCOPY;  Surgeon: PBeryle Beams MD;  [facility];  Service: Endoscopy;  Laterality: N/A;   COLONOSCOPY WITH PROPOFOL N/A 12/11/2014   Procedure: COLONOSCOPY WITH PROPOFOL;  Surgeon: PCarol Ada MD;  [facility];  Service: Endoscopy;  Laterality: N/A;   ESOPHAGOGASTRODUODENOSCOPY N/A 12/05/2013   Procedure: ESOPHAGOGASTRODUODENOSCOPY (EGD);  Surgeon: PBeryle Beams MD;  [facility];  Service: Endoscopy;  Laterality: N/A;   LEFT HEART CATHETERIZATION WITH CORONARY ANGIOGRAM N/A 02/17/2013   Procedure: LEFT HEART CATHETERIZATION WITH CORONARY ANGIOGRAM;  Surgeon: MSanda Klein MD;  [facility];  Service: Cardiovascular;  Laterality: N/A;   PERCUTANEOUS CORONARY STENT INTERVENTION (PCI-S)  November 2014   Mid LAD 2.5 mm x 12 mm (2.75 mm) Promus DES; OM1 2.75 mm x 60 mm (2.85 mm) Promus DES   TOTAL HIP ARTHROPLASTY Left 10/17/2017   TOTAL HIP ARTHROPLASTY Left 10/17/2017   Procedure: TOTAL HIP ARTHROPLASTY ANTERIOR APPROACH;  Surgeon: RFrederik Pear MD;  [facility]  Service: Orthopedics;  Laterality: Left;   TRANSTHORACIC ECHOCARDIOGRAM  09/2017    Normal EF 55-60%. No RWMA.  Gr 1-2DD.  Mild AS. (mean gradient 21  mmHg)     Current Medications: Current Meds  Medication Sig   ACCU-CHEK AVIVA PLUS test strip 1 each by Other route 2 (two) times daily.    amLODipine (NORVASC) 10 MG tablet TAKE 1 TABLET BY MOUTH EVERY DAY   aspirin EC 81 MG tablet Take 81 mg by mouth daily. Swallow whole.   citalopram (CELEXA) 20 MG tablet Take 20 mg by mouth daily.   FARXIGA 10 MG TABS tablet Take 10 mg by mouth every morning.   HUMALOG 100 UNIT/ML injection Inject 20 Units into the skin continuous.    hydrochlorothiazide (HYDRODIURIL) 25 MG tablet Take 25 mg by mouth daily.   Insulin Disposable Pump (V-GO 20) KIT Humalog   isosorbide  mononitrate (IMDUR) 30 MG 24 hr tablet TAKE 1 TABLET(30 MG) BY MOUTH DAILY   losartan (COZAAR) 25 MG tablet TAKE 1 TABLET(25 MG) BY MOUTH DAILY   metFORMIN (GLUCOPHAGE) 1000 MG tablet Take 2,000 mg by mouth daily.   metFORMIN (GLUCOPHAGE) 500 MG tablet Take 1 tablet (500 mg total) by mouth daily. (Patient taking differently: Take 2,000 mg by mouth 2 (two) times daily.)   metoprolol tartrate (LOPRESSOR) 25 MG tablet Take 1 tablet (25 mg total) by mouth 2 (two) times daily. Schedule an appointment for further refills. 2nd attempt   nitroGLYCERIN (NITROSTAT) 0.4 MG SL tablet Place 1 tablet (0.4 mg total) under the tongue every 5 (five) minutes x 3 doses as needed for chest pain.   potassium chloride SA (K-DUR,KLOR-CON) 20 MEQ tablet Take 20 mEq by mouth daily. Take Half 1/2 Tablet Daily.   Dixon 420 MG/3.5ML SOCT INJECT 1 SYRINGE UNDER THE SKIN EVERY 30 DAYS   rosuvastatin (CRESTOR) 40 MG tablet TAKE 1 TABLET(40 MG) BY MOUTH DAILY   spironolactone (ALDACTONE) 25 MG tablet TAKE 1 TABLET(25 MG) BY MOUTH DAILY   TRULICITY 1.5 RC/1.6LA SOPN Inject 1.5 mg into the skin once a week. Takes every Monday     Allergies:   Iohexol   Social History   Socioeconomic History   Marital status: Single    Spouse name: Not on file   Number of children: Not on file   Years of education: Not on file   Highest education level: Not on file  Occupational History   Not on file  Tobacco Use   Smoking status: Never   Smokeless tobacco: Never  Vaping Use   Vaping Use: Never used  Substance and Sexual Activity   Alcohol use: No   Drug use: No   Sexual activity: Not on file  Other Topics Concern   Not on file  Social History Narrative   She is married with 4 children.   She does not smoke and does not drink alcohol.   Social Determinants of Health   Financial Resource Strain: Not on file  Food Insecurity: Not on file  Transportation Needs: Not on file  Physical Activity: Not on file   Stress: Not on file  Social Connections: Not on file     Family History: The patient's family history includes Asthma in her mother; Cancer in her father; Heart attack in her mother; Heart disease in her brother, mother, and sister.  ROS:   Please see the history of present illness.     EKGs/Labs/Other Studies Reviewed:    EKG:  The ekg ordered today demonstrates normal sinus rhythm with a heart rate of 66.  Recent Labs: 01/23/2021: Hemoglobin 10.5; Platelets 189 02/25/2021: BNP 21.8 03/08/2021: ALT 10 03/21/2021: BUN  24; Creatinine, Ser 1.69; Potassium 3.9; Sodium 142   Recent Lipid Panel Lab Results  Component Value Date/Time   CHOL 93 03/08/2021 01:00 PM   TRIG 74 03/08/2021 01:00 PM   HDL 44 03/08/2021 01:00 PM   LDLCALC 34 03/08/2021 01:00 PM    Physical Exam:    VS:  BP 116/78   Pulse 66   Ht 5' 5"  (1.651 m)   Wt 219 lb 3.2 oz (99.4 kg)   SpO2 97%   BMI 36.48 kg/m    No data found.  Wt Readings from Last 3 Encounters:  09/07/21 219 lb 3.2 oz (99.4 kg)  02/25/21 209 lb 3.2 oz (94.9 kg)  01/23/21 218 lb (98.9 kg)     GEN:  Well nourished, well developed in no acute distress HEENT: Normal NECK: No JVD; No carotid bruits CARDIAC: RRR, 2/6 systolic murmur RESPIRATORY:  Clear to auscultation without rales, wheezing or rhonchi  ABDOMEN: Soft, non-tender, non-distended MUSCULOSKELETAL: No edema; No deformity  SKIN: Warm and dry NEUROLOGIC:  Alert and oriented PSYCHIATRIC:  Normal affect     ASSESSMENT AND PLAN   Precordial pain, resolved -For possible GERD symptoms, prescribed Pepcid in November 2022.  Coronary artery disease with no angina -PCI to LAD/OM1 02/2013 -Continue aspirin 81 mg with no bleeding. -Continue metoprolol tartrate, statin therapy. -She is on Imdur 30 mg daily. -We discussed the importance of better diabetic control and weight loss.  Aortic stenosis -Mild aortic stenosis on 2D echo December 2022. -No significant heart failure  symptoms.  No chest pain or syncope.  Hypertension, well controlled -Currently take amlodipine 10 mg daily, losartan 25 mg daily and spironolactone 25 mg daily. -Potassium 4.9 in Aug 22, 2021.  With creatinine of 1.77, recommend decreasing her potassium supplement to 10 mEq.  Check BMET in 2 weeks. -Goal BP is <130/80.  Recommend DASH diet (high in vegetables, fruits, low-fat dairy products, whole grains, poultry, fish, and nuts and low in sweets, sugar-sweetened beverages, and red meats), salt restriction and increase physical activity.  Hyperlipidemia with goal LDL less than 70 -LDL 52 in May 2023. -Continue Repatha and Crestor. -Discussed cholesterol lowering diets - Mediterranean diet, DASH diet, vegetarian diet, low-carbohydrate diet and avoidance of trans fats.  Discussed healthier choice substitutes.  Nuts, high-fiber foods, and fiber supplements may also improve lipids.    Obesity -Discussed how even a 5-10% weight loss can have cardiovascular benefits.   -Recommend moderate intensity activity for 30 minutes 5 days/week and the DASH diet.  Disposition - Follow-up in in 1 year.   Medication Adjustments/Labs and Tests Ordered: Current medicines are reviewed at length with the patient today.  Concerns regarding medicines are outlined above.  Orders Placed This Encounter  Procedures   Basic metabolic panel   EKG [id number]   No orders of the defined types were placed in this encounter.   Patient Instructions  Medication Instructions:  Decrease Potassium 20 mg ( Take half 1/2 Tablet Daily). *If you need a refill on your cardiac medications before your next appointment, please call your pharmacy*   Lab Work: BMEt In 2 weeks If you have labs (blood work) drawn today and your tests are completely normal, you will receive your results only by: [place] (if you have MyChart) OR A paper copy in the mail If you have any lab test that is abnormal or we need to change your  treatment, we will call you to review the results.   Testing/Procedures: No Testing  Follow-Up: At [facility], you and your health needs are our priority.  As part of our continuing mission to provide you with exceptional heart care, we have created designated Provider Care Teams.  These Care Teams include your primary Cardiologist (physician) and Advanced Practice Providers (APPs -  Physician Assistants and Nurse Practitioners) who all work together to provide you with the care you need, when you need it.  We recommend signing up for the patient portal called "MyChart".  Sign up information is provided on this After Visit Summary.  MyChart is used to connect with patients for Virtual Visits (Telemedicine).  Patients are able to view lab/test results, encounter notes, upcoming appointments, etc.  Non-urgent messages can be sent to your provider as well.   To learn more about what you can do with MyChart, go to [email or web address].    Your next appointment:   1 year(s)  The format for your next appointment:   In Person  Provider:   Glenetta Hew, MD       Important Information About Sugar         Signed, Warren Lacy, PA-C  09/07/2021 9:50 AM    [place]

## 2021-09-07 ENCOUNTER — Encounter: Payer: Self-pay | Admitting: Physician Assistant

## 2021-09-07 ENCOUNTER — Ambulatory Visit (INDEPENDENT_AMBULATORY_CARE_PROVIDER_SITE_OTHER): Payer: Medicare Other | Admitting: Physician Assistant

## 2021-09-07 VITALS — BP 116/78 | HR 66 | Ht 65.0 in | Wt 219.2 lb

## 2021-09-07 DIAGNOSIS — I2510 Atherosclerotic heart disease of native coronary artery without angina pectoris: Secondary | ICD-10-CM | POA: Diagnosis not present

## 2021-09-07 DIAGNOSIS — E669 Obesity, unspecified: Secondary | ICD-10-CM | POA: Diagnosis not present

## 2021-09-07 DIAGNOSIS — E785 Hyperlipidemia, unspecified: Secondary | ICD-10-CM

## 2021-09-07 DIAGNOSIS — E1169 Type 2 diabetes mellitus with other specified complication: Secondary | ICD-10-CM

## 2021-09-07 DIAGNOSIS — I10 Essential (primary) hypertension: Secondary | ICD-10-CM

## 2021-09-07 NOTE — Patient Instructions (Signed)
Medication Instructions:  Decrease Potassium 20 mg ( Take half 1/2 Tablet Daily). *If you need a refill on your cardiac medications before your next appointment, please call your pharmacy*   Lab Work: BMEt In 2 weeks If you have labs (blood work) drawn today and your tests are completely normal, you will receive your results only by: [place] (if you have MyChart) OR A paper copy in the mail If you have any lab test that is abnormal or we need to change your treatment, we will call you to review the results.   Testing/Procedures: No Testing   Follow-Up: At [facility], you and your health needs are our priority.  As part of our continuing mission to provide you with exceptional heart care, we have created designated Provider Care Teams.  These Care Teams include your primary Cardiologist (physician) and Advanced Practice Providers (APPs -  Physician Assistants and Nurse Practitioners) who all work together to provide you with the care you need, when you need it.  We recommend signing up for the patient portal called "MyChart".  Sign up information is provided on this After Visit Summary.  MyChart is used to connect with patients for Virtual Visits (Telemedicine).  Patients are able to view lab/test results, encounter notes, upcoming appointments, etc.  Non-urgent messages can be sent to your provider as well.   To learn more about what you can do with MyChart, go to [email or web address].    Your next appointment:   1 year(s)  The format for your next appointment:   In Person  Provider:   Glenetta Hew, MD       Important Information About Sugar

## 2021-09-14 ENCOUNTER — Other Ambulatory Visit (HOSPITAL_COMMUNITY): Payer: Self-pay | Admitting: Family Medicine

## 2021-09-14 DIAGNOSIS — E785 Hyperlipidemia, unspecified: Secondary | ICD-10-CM | POA: Diagnosis not present

## 2021-09-14 DIAGNOSIS — I2510 Atherosclerotic heart disease of native coronary artery without angina pectoris: Secondary | ICD-10-CM | POA: Diagnosis not present

## 2021-09-14 DIAGNOSIS — E1169 Type 2 diabetes mellitus with other specified complication: Secondary | ICD-10-CM | POA: Diagnosis not present

## 2021-09-14 DIAGNOSIS — Z1231 Encounter for screening mammogram for malignant neoplasm of breast: Secondary | ICD-10-CM

## 2021-09-14 DIAGNOSIS — I10 Essential (primary) hypertension: Secondary | ICD-10-CM | POA: Diagnosis not present

## 2021-09-15 ENCOUNTER — Telehealth: Payer: Self-pay

## 2021-09-15 LAB — BASIC METABOLIC PANEL
BUN/Creatinine Ratio: 18 (ref 12–28)
BUN: 28 mg/dL — ABNORMAL HIGH (ref 8–27)
CO2: 23 mmol/L (ref 20–29)
Calcium: 9.6 mg/dL (ref 8.7–10.3)
Chloride: 107 mmol/L — ABNORMAL HIGH (ref 96–106)
Creatinine, Ser: 1.55 mg/dL — ABNORMAL HIGH (ref 0.57–1.00)
Glucose: 188 mg/dL — ABNORMAL HIGH (ref 70–99)
Potassium: 4.4 mmol/L (ref 3.5–5.2)
Sodium: 141 mmol/L (ref 134–144)
eGFR: 35 mL/min/{1.73_m2} — ABNORMAL LOW (ref >59)

## 2021-09-15 NOTE — Telephone Encounter (Addendum)
Called patient regarding results. Patient had understanding of results.----- Message from Warren Lacy, PA-C sent at 09/15/2021  8:24 AM EDT ----- Kidney function improved compared to December.  Potassium normal.  Continue current medications.

## 2021-10-27 ENCOUNTER — Ambulatory Visit (HOSPITAL_COMMUNITY)
Admission: RE | Admit: 2021-10-27 | Discharge: 2021-10-27 | Disposition: A | Discharge status: Home / Self Care | Payer: Medicare Other | Source: Ambulatory Visit | Attending: Family Medicine | Admitting: Family Medicine

## 2021-10-27 DIAGNOSIS — Z1231 Encounter for screening mammogram for malignant neoplasm of breast: Secondary | ICD-10-CM | POA: Insufficient documentation

## 2021-11-04 ENCOUNTER — Ambulatory Visit: Payer: Self-pay

## 2021-11-04 NOTE — Patient Outreach (Signed)
  Care Coordination   Initial Visit Note   11/04/2021 Name: Phyllis Conner MRN: [id number] DOB: [birth year]  Phyllis Conner is a 76 y.o. year old female who sees Lucianne Lei, MD for primary care. I spoke with  Phyllis Conner by phone today  What matters to the patients health and wellness today?  Repatha Administration   Goals Addressed               This Visit's Progress     Patient Stated     COMPLETED: I want to administer my Repatha better (pt-stated)        Care Coordination Interventions: Patient reports she has difficulty administering Repatha and questions if she is administering full dose. Patient states she wants to switch to the bi-weekly shot Discussed plan for SW to reach out to patients cardiologist regarding patients concern Collaboration with Dr. Ellyn Hack advising of patients concern with administration of [place] with [place] Coordinator to request she follow up with the patient         SDOH assessments and interventions completed:   Yes SDOH Interventions Today    Flowsheet Row Most Recent Value  SDOH Interventions   Food Insecurity Interventions Intervention Not Indicated  Housing Interventions Intervention Not Indicated  Transportation Interventions Intervention Not Indicated       Care Coordination Interventions Activated:  Yes Care Coordination Interventions:  Yes, provided  Follow up plan: Referral made to RN Care Coordinator  Encounter Outcome:  Pt. Visit Completed  Daneen Schick, BSW, CDP Social Worker, Certified Dementia Practitioner Care Coordination [phone]

## 2021-11-04 NOTE — Patient Instructions (Signed)
Visit Information  Thank you for taking time to visit with me today. Please don't hesitate to contact me if I can be of assistance to you.   Following are the goals we discussed today:   Goals Addressed               This Visit's Progress     Patient Stated     COMPLETED: I want to administer my Repatha better (pt-stated)        Care Coordination Interventions: Patient reports she has difficulty administering Repatha and questions if she is administering full dose. Patient states she wants to switch to the bi-weekly shot Discussed plan for SW to reach out to patients cardiologist regarding patients concern Collaboration with Dr. Ellyn Hack advising of patients concern with administration of [place] with [place] Coordinator to request she follow up with the patient         Please call the care guide team at [phone] if you need to schedule an appointment with me.  If you are experiencing a Mental Health or [place] or need someone to talk to, please call the [facility]: [phone]  The patient verbalized understanding of instructions, educational materials, and care plan provided today and DECLINED offer to receive copy of patient instructions, educational materials, and care plan.   No SW follow up planned at this time. The patient will be followed by RN Care Coordinator.  Daneen Schick, BSW, CDP Social Worker, Certified Dementia Practitioner Care Coordination [phone]

## 2021-11-07 ENCOUNTER — Telehealth: Payer: Self-pay | Admitting: Pharmacist Clinician (PhC)/ Clinical Pharmacy Specialist

## 2021-11-07 MED ORDER — REPATHA SURECLICK 140 MG/ML ~~LOC~~ SOAJ: 140.0000 mg | SUBCUTANEOUS | 12 refills | Status: DC

## 2021-11-07 NOTE — Telephone Encounter (Signed)
-----   Message from Phyllis Man, MD sent at 11/04/2021  6:03 PM EDT ----- Regarding: FW: Repatha I have not seen her in quite a while.  Would defer bi-monthly vs. Monthly Rx to our Pharm-D team -- forwarded to Tommy Medal, RPH-CCP  Phyllis Conner c ----- Message ----- From: Phyllis Conner Sent: 11/04/2021   4:15 PM EDT To: Phyllis Man, MD; Lynne Logan, RN Subject: [place] [employer] afternoon Dr. Ellyn Hack,  My name is Phyllis Conner, I am a Education officer, museum with [employer]. I recently spoke with Phyllis Conner to complete a screening call. During today's call patient reported she has difficulty administering monthly Repatha and feels she does not always put it together right or administer all of the medication. She asked about switching to a bi-weekly shot as she feels she would be more successful.   I let the patient know I would send this information to you as well as ask my colleague Phyllis Merino RN Care Manager (included in this message) to follow up with her to assist as needed with care coordination and education needs.  Thank you, Phyllis Conner, BSW, CDP Social Worker, Certified Dementia Practitioner Care Coordination [phone]

## 2021-11-07 NOTE — Telephone Encounter (Signed)
Spoke with patient.  She agrees that she would prefer to use the Sureclick rather than Pushtronix.  PA good until 04/09/22, so will switch rx at pharmacy.  Patient notes that she has done self injection in the past, and thinks she can do without coming to the office for teaching.  Assured her there is information in the box with step by step instructions.  She can call with any concerns.    Patient voiced understanding.

## 2021-11-08 ENCOUNTER — Ambulatory Visit: Payer: Self-pay

## 2021-11-08 NOTE — Patient Outreach (Signed)
  Care Coordination   Initial Visit Note   11/08/2021 Name: Phyllis Conner MRN: [id number] DOB: 21-Jul-1945  Phyllis Conner is a 76 y.o. year old female who sees Lucianne Lei, MD for primary care. I spoke with  Phyllis Conner by phone today  What matters to the patients health and wellness today?  Patient would like to work on improving her diabetes.    Goals Addressed       Patient Stated     "I want to work on improving my diabetes" (pt-stated)        Care Coordination Interventions: Provided education to patient about basic DM disease process Reviewed medications with patient and discussed importance of medication adherence Counseled on importance of regular laboratory monitoring as prescribed Advised patient, providing education and rationale, to check cbg daily before meals and at bedtime and record, calling PCP for findings outside established parameters Review of patient status, including review of consultants reports, relevant laboratory and other test results, and medications completed Educated patient on dietary and exercise recommendations; daily glycemic control FBS 80-130, <180 after meals Educated patient regarding the PREP program, message sent to PCP requesting referral  Educated patient while providing rationale the importance to perform daily foot inspections, sent PCP message via in basket re: need for Podiatry referral Determined eye exam is current  Mailed printed educational materials related to Diabetes management     SDOH assessments and interventions completed:   Yes   Care Coordination Interventions Activated:  Yes Care Coordination Interventions:  Yes, provided  Follow up plan: Follow up call scheduled for 12/06/21 '@09'$ :30 AM   Encounter Outcome:  Pt. Visit Completed

## 2021-11-08 NOTE — Patient Instructions (Signed)
Visit Information  Thank you for taking time to visit with me today. Please don't hesitate to contact me if I can be of assistance to you.   Following are the goals we discussed today:   Goals Addressed       Patient Stated     "I want to work on improving my diabetes" (pt-stated)        Care Coordination Interventions: Provided education to patient about basic DM disease process Reviewed medications with patient and discussed importance of medication adherence Counseled on importance of regular laboratory monitoring as prescribed Advised patient, providing education and rationale, to check cbg daily before meals and at bedtime and record, calling PCP for findings outside established parameters Review of patient status, including review of consultants reports, relevant laboratory and other test results, and medications completed Educated patient on dietary and exercise recommendations; daily glycemic control FBS 80-130, <180 after meals Educated patient regarding the PREP program, message sent to PCP requesting referral  Educated patient while providing rationale the importance to perform daily foot inspections, sent PCP message via in basket re: need for Podiatry referral Determined eye exam is current  Mailed printed educational materials related to Diabetes management     Our next appointment is by telephone on 12/06/21 at 09:30 AM   Please call the care guide team at [phone] if you need to cancel or reschedule your appointment.   If you are experiencing a Mental Health or [place] or need someone to talk to, please call 1-800-273-TALK (toll free, 24 hour hotline)  The patient verbalized understanding of instructions, educational materials, and care plan provided today and agreed to receive a mailed copy of patient instructions, educational materials, and care plan.   Barb Merino, RN, BSN, CCM Care Management Coordinator [facility] Care Management Direct Phone:  [phone]

## 2021-11-23 DIAGNOSIS — Z86010 Personal history of colonic polyps: Secondary | ICD-10-CM | POA: Diagnosis not present

## 2021-11-23 DIAGNOSIS — R079 Chest pain, unspecified: Secondary | ICD-10-CM | POA: Diagnosis not present

## 2021-11-23 DIAGNOSIS — R0602 Shortness of breath: Secondary | ICD-10-CM | POA: Diagnosis not present

## 2021-12-02 DIAGNOSIS — E1169 Type 2 diabetes mellitus with other specified complication: Secondary | ICD-10-CM | POA: Diagnosis not present

## 2021-12-02 DIAGNOSIS — I259 Chronic ischemic heart disease, unspecified: Secondary | ICD-10-CM | POA: Diagnosis not present

## 2021-12-02 DIAGNOSIS — E1122 Type 2 diabetes mellitus with diabetic chronic kidney disease: Secondary | ICD-10-CM | POA: Diagnosis not present

## 2021-12-02 DIAGNOSIS — E1165 Type 2 diabetes mellitus with hyperglycemia: Secondary | ICD-10-CM | POA: Diagnosis not present

## 2021-12-06 ENCOUNTER — Ambulatory Visit: Payer: Self-pay

## 2021-12-06 NOTE — Patient Instructions (Signed)
Visit Information  Thank you for taking time to visit with me today. Please don't hesitate to contact me if I can be of assistance to you.   Following are the goals we discussed today:   Goals Addressed     Patient Stated     "I want to work on improving my diabetes" (pt-stated)        Care Coordination Interventions: Provided education to patient about basic DM disease process Advised patient, providing education and rationale, to check cbg daily before breakfast and at bedtime and record, calling PCP for findings outside established parameters Review of patient status, including review of consultants reports, relevant laboratory and other test results, and medications completed Educated patient with rationale about the dangers in skipping meals Educated patient on dietary and exercise recommendations; daily glycemic control FBS 80-130, <180 after meals Educated on importance to eat 5-6 small meals daily, limiting carbohydrates and adding more fresh/frozen vegetables  Reinforced importance to implement a daily exercise routine aiming for 30 minutes daily as tolerated Confirmed patient received printed educational materials related to Diabetes management, she denies questions at this time     Our next appointment is by telephone on 02/06/22 at 09:15 AM   Please call the care guide team at [phone] if you need to cancel or reschedule your appointment.   If you are experiencing a Mental Health or [place] or need someone to talk to, please call 1-800-273-TALK (toll free, 24 hour hotline)  Patient verbalizes understanding of instructions and care plan provided today and agrees to view in [place]. Active MyChart status and patient understanding of how to access instructions and care plan via MyChart confirmed with patient.     Barb Merino, RN, BSN, CCM Care Management Coordinator [facility] Care Management Direct Phone: [phone]

## 2021-12-06 NOTE — Patient Outreach (Signed)
  Care Coordination   Follow Up Visit Note   12/06/2021 Name: Phyllis Conner MRN: [id number] DOB: 1945-11-10  Phyllis Conner is a 76 y.o. year old female who sees Lucianne Lei, MD for primary care. I spoke with  Phyllis Conner by phone today.  What matters to the patients health and wellness today?  Patient would like to increase her exercise and eat better.     Goals Addressed       Patient Stated     "I want to work on improving my diabetes" (pt-stated)        Care Coordination Interventions: Provided education to patient about basic DM disease process Advised patient, providing education and rationale, to check cbg daily before breakfast and at bedtime and record, calling PCP for findings outside established parameters Review of patient status, including review of consultants reports, relevant laboratory and other test results, and medications completed Educated patient with rationale about the dangers in skipping meals Educated patient on dietary and exercise recommendations; daily glycemic control FBS 80-130, <180 after meals Educated on importance to eat 5-6 small meals daily, limiting carbohydrates and adding more fresh/frozen vegetables  Reinforced importance to implement a daily exercise routine aiming for 30 minutes daily as tolerated Confirmed patient received printed educational materials related to Diabetes management, she denies questions at this time     SDOH assessments and interventions completed:  No     Care Coordination Interventions Activated:  Yes  Care Coordination Interventions:  Yes, provided   Follow up plan: Follow up call scheduled for 02/06/22 '@09'$ :15 AM     Encounter Outcome:  Pt. Visit Completed

## 2021-12-22 ENCOUNTER — Other Ambulatory Visit: Payer: Self-pay | Admitting: Cardiology

## 2022-01-03 ENCOUNTER — Other Ambulatory Visit: Payer: Self-pay | Admitting: Gastroenterology

## 2022-01-05 ENCOUNTER — Encounter (HOSPITAL_COMMUNITY): Payer: Self-pay | Admitting: Gastroenterology

## 2022-01-05 NOTE — Progress Notes (Signed)
Attempted to obtain medical history via telephone, unable to reach at this time. Unable to leave voicemail to return pre surgical testing department's phone call,due to mailbox not set up.   

## 2022-01-11 NOTE — Progress Notes (Signed)
Patient answered pre surg call for endo procedure for 10/6, upon reviewing her medicine she informed me she took her Trulicity on 31/5. Based on current anesthesia guidelines, that medicine needed to be held for 1 week prior to procedure. I called the GI office and informed them of this and the need for it to be rescheduled, office stated they would call and let patient know.

## 2022-02-02 ENCOUNTER — Other Ambulatory Visit: Payer: Self-pay | Admitting: Cardiology

## 2022-02-06 ENCOUNTER — Telehealth: Payer: Self-pay

## 2022-02-06 ENCOUNTER — Ambulatory Visit: Payer: Self-pay

## 2022-02-06 DIAGNOSIS — E1169 Type 2 diabetes mellitus with other specified complication: Secondary | ICD-10-CM

## 2022-02-06 NOTE — Patient Instructions (Addendum)
Visit Information  Thank you for taking time to visit with me today. Please don't hesitate to contact me if I can be of assistance to you.   Following are the goals we discussed today:   Goals Addressed               This Visit's Progress     Patient Stated     "I want to work on improving my diabetes" (pt-stated)        Care Coordination Interventions: Evaluation of current treatment plan related to diabetes mellitus and patient's adherence to plan as established by provider Confirmed patient is checking her CBG's daily before breakfast and at bedtime and record, patient reports her sugars are within target range Review of patient status, including review of consultants reports, relevant laboratory and other test results, and medications completed Educated patient with rationale the importance of establishing a routine exercise regimen, discussed ADA recommendations Educated patient regarding the provider exercise referral program, patient would like to participate but will need help with transportation Sent email to the PREP [facility]  Sent message to [place] asking for knowledge related to [facility] transportation for PREP Sent care guide referral [phone]) to assist with transportation to help transport patient to the [facility] for participation in the provider referral exercise program         Our next appointment is by telephone on 02/21/22 at 11:30 AM  Please call the care guide team at [phone] if you need to cancel or reschedule your appointment.   If you are experiencing a Mental Health or [place] or need someone to talk to, please call 1-800-273-TALK (toll free, 24 hour hotline) go to [facility] [place] [phone])  The patient verbalized understanding of instructions, educational materials, and care plan provided today and agreed to  receive a mailed copy of patient instructions, educational materials, and care plan.   Barb Merino, RN, BSN, CCM Care Management Coordinator [facility] Care Management Direct Phone: [phone]

## 2022-02-06 NOTE — Patient Outreach (Signed)
  Care Coordination   Follow Up Visit Note   02/06/2022 Name: Phyllis Conner MRN: [id number] DOB: [birth year]  Phyllis Conner is a 76 y.o. year old female who sees Lucianne Lei, MD for primary care. I spoke with  Phyllis Conner by phone today.  What matters to the patients health and wellness today?  Patient would like to increase her physical activity to help improve her diabetes and overall health.     Goals Addressed               This Visit's Progress     Patient Stated     "I want to work on improving my diabetes" (pt-stated)        Care Coordination Interventions: Evaluation of current treatment plan related to diabetes mellitus and patient's adherence to plan as established by provider Confirmed patient is checking her CBG's daily before breakfast and at bedtime and record, patient reports her sugars are within target range Review of patient status, including review of consultants reports, relevant laboratory and other test results, and medications completed Educated patient with rationale the importance of establishing a routine exercise regimen, discussed ADA recommendations Educated patient regarding the provider exercise referral program, patient would like to participate but will need help with transportation Sent email to the PREP regarding [facility] options [facility]P [facility]  Sent message to [place] asking for knowledge related to [facility] transportation for PREP Sent care guide referral [phone]) to assist with transportation to help [facility] for participation in the provider referral exercise program         SDOH assessments and interventions completed:  Yes  SDOH Interventions Today    Flowsheet Row Most Recent Value  SDOH Interventions   Transportation Interventions Ambulatory [id number] Order  [patient needs help with transportation to the [facility] for the provider referral exercise program  (PREP)]        Care  Coordination Interventions Activated:  Yes  Care Coordination Interventions:  Yes, provided   Follow up plan: Follow up call scheduled for 02/21/22 '@11'$ :30 AM    Encounter Outcome:  Pt. Visit Completed

## 2022-02-06 NOTE — Telephone Encounter (Signed)
   Telephone encounter was:  Successful.  02/06/2022 Name: TANICIA WOLAVER MRN: [id number] DOB: 17-May-1945  [name] is a 76 y.o. year old female who is a primary care patient of Lucianne Lei, MD . The community resource team was consulted for assistance with Transportation Needs   Care guide performed the following interventions: Patient provided with information about care guide support team and interviewed to confirm resource needs Spoke with patient about [facility] transportation [phone] .  Follow Up Plan:  No further follow up planned at this time. The patient has been provided with needed resources.  [place] Resource Care Guide   [email or web address]  ?? [phone]   Website: [email or web address]  [email or web address]

## 2022-02-21 ENCOUNTER — Ambulatory Visit: Payer: Self-pay

## 2022-02-21 NOTE — Patient Instructions (Signed)
Visit Information  Thank you for taking time to visit with me today. Please don't hesitate to contact me if I can be of assistance to you.   Following are the goals we discussed today:   Goals Addressed               This Visit's Progress     Patient Stated     "I want to work on improving my diabetes" (pt-stated)        Care Coordination Interventions: Evaluation of current treatment plan related to diabetes mellitus and patient's adherence to plan as established by provider Determined patient did not receive a call from the [place] secure email to PCP referral coordinator, Lorne Skeens a secure email requesting referral if approved by Dr. Criss Rosales Reviewed and discussed the transportation resources previously provided to patient by the [place]       Other     I am grieving over the loss of my son        Care Coordination Interventions: Determined patient is grieving over the recent loss of her son who became acutely ill this past week Active listening / Reflection utilized  Emotional Support Provided Offered to refer patient to Christa See LCSW for grief counseling, patient declines at this time Encouraged patient to notify this RN if counseling services are needed           Our next appointment is by telephone on 03/21/22 at 12 PM  Please call the care guide team at [phone] if you need to cancel or reschedule your appointment.   If you are experiencing a Mental Health or [place] or need someone to talk to, please call 1-800-273-TALK (toll free, 24 hour hotline)  The patient verbalized understanding of instructions, educational materials, and care plan provided today and agreed to receive a mailed copy of patient instructions, educational materials, and care plan.   Barb Merino, RN, BSN, CCM Care Management Coordinator [facility] Care Management Direct Phone: [phone]

## 2022-02-21 NOTE — Patient Outreach (Signed)
  Care Coordination   Follow Up Visit Note   02/21/2022 Name: [name] MRN: [id number] DOB: 1946-01-15  Phyllis Conner is a 76 y.o. year old female who sees Lucianne Lei, MD for primary care. I spoke with  Phyllis Conner by phone today.  What matters to the patients health and wellness today?  Patient would like to participate in the PREP program. Patient is grieving over the recent loss of her son.     Goals Addressed               This Visit's Progress     Patient Stated     "I want to work on improving my diabetes" (pt-stated)        Care Coordination Interventions: Evaluation of current treatment plan related to diabetes mellitus and patient's adherence to plan as established by provider Determined patient did not receive a call from the [place] secure email to PCP referral coordinator, Lorne Skeens a secure email requesting referral if approved by Dr. Criss Rosales Reviewed and discussed the transportation resources previously provided to patient by the [place]       Other     I am grieving over the loss of my son        Care Coordination Interventions: Determined patient is grieving over the recent loss of her son who became acutely ill this past week Active listening / Reflection utilized  Emotional Support Provided Offered to refer patient to Christa See LCSW for grief counseling, patient declines at this time Encouraged patient to notify this RN if counseling services are needed           SDOH assessments and interventions completed:  No     Care Coordination Interventions Activated:  Yes  Care Coordination Interventions:  Yes, provided   Follow up plan: Follow up call scheduled for 03/21/22 '@12'$  PM     Encounter Outcome:  Pt. Visit Completed

## 2022-03-10 ENCOUNTER — Other Ambulatory Visit: Payer: Self-pay | Admitting: Cardiology

## 2022-03-20 DIAGNOSIS — E785 Hyperlipidemia, unspecified: Secondary | ICD-10-CM | POA: Diagnosis not present

## 2022-03-20 DIAGNOSIS — E7800 Pure hypercholesterolemia, unspecified: Secondary | ICD-10-CM | POA: Diagnosis not present

## 2022-03-20 DIAGNOSIS — I10 Essential (primary) hypertension: Secondary | ICD-10-CM | POA: Diagnosis not present

## 2022-03-20 DIAGNOSIS — E1159 Type 2 diabetes mellitus with other circulatory complications: Secondary | ICD-10-CM | POA: Diagnosis not present

## 2022-03-20 DIAGNOSIS — I119 Hypertensive heart disease without heart failure: Secondary | ICD-10-CM | POA: Diagnosis not present

## 2022-03-20 DIAGNOSIS — I259 Chronic ischemic heart disease, unspecified: Secondary | ICD-10-CM | POA: Diagnosis not present

## 2022-03-20 DIAGNOSIS — E1169 Type 2 diabetes mellitus with other specified complication: Secondary | ICD-10-CM | POA: Diagnosis not present

## 2022-03-21 ENCOUNTER — Ambulatory Visit: Payer: Self-pay

## 2022-03-21 NOTE — Patient Instructions (Signed)
Visit Information  Thank you for taking time to visit with me today. Please don't hesitate to contact me if I can be of assistance to you.   Following are the goals we discussed today:   Goals Addressed               This Visit's Progress     Patient Stated     "I want to work on improving my diabetes" (pt-stated)        Care Coordination Interventions: Placed successful outbound call  to patient  Evaluation of current treatment plan related to diabetes mellitus and patient's adherence to plan as established by provider Reviewed medications with patient and discussed importance of medication adherence Advised patient, providing education and rationale, to check cbg daily before meals and at bedtime and record, calling PCP for findings outside established parameters Review of patient status, including review of consultants reports, relevant laboratory and other test results, and medications completed Determined patient would like to participate in the PREP program next year in the Spring due to current commitment to provide childcare for her grandchildren Reiterated the importance of starting a routine exercise regimen to help keep diabetes under good control and to improve overall health            Our next appointment is by telephone on 05/24/22 at 0230 PM  Please call the care guide team at [phone] if you need to cancel or reschedule your appointment.   If you are experiencing a Mental Health or [place] or need someone to talk to, please call 1-800-273-TALK (toll free, 24 hour hotline)  The patient verbalized understanding of instructions, educational materials, and care plan provided today and agreed to receive a mailed copy of patient instructions, educational materials, and care plan.   Barb Merino, RN, BSN, CCM Care Management Coordinator [facility] Care Management Direct Phone: [phone]

## 2022-03-21 NOTE — Patient Outreach (Signed)
  Care Coordination   Follow Up Visit Note   03/21/2022 Name: Phyllis Conner MRN: [id number] DOB: 11-23-1945  Phyllis Conner is a 76 y.o. year old female who sees Lucianne Lei, MD for primary care. I spoke with  Phyllis Conner by phone today.  What matters to the patients health and wellness today?  Patient will continue to work on her diet and implement an exercise regimen in her daily routine to help manage her diabetes.     Goals Addressed               This Visit's Progress     Patient Stated     "I want to work on improving my diabetes" (pt-stated)        Care Coordination Interventions: Placed successful outbound call  to patient  Evaluation of current treatment plan related to diabetes mellitus and patient's adherence to plan as established by provider Reviewed medications with patient and discussed importance of medication adherence Advised patient, providing education and rationale, to check cbg daily before meals and at bedtime and record, calling PCP for findings outside established parameters Review of patient status, including review of consultants reports, relevant laboratory and other test results, and medications completed Determined patient would like to participate in the PREP program next year in the Spring due to current commitment to provide childcare for her grandchildren Reiterated the importance of starting a routine exercise regimen to help keep diabetes under good control and to improve overall health            SDOH assessments and interventions completed:  No     Care Coordination Interventions:  Yes, provided   Follow up plan: Follow up call scheduled for 05/24/22 '@230'$  PM    Encounter Outcome:  Pt. Visit Completed

## 2022-05-01 ENCOUNTER — Other Ambulatory Visit (HOSPITAL_COMMUNITY): Payer: Self-pay

## 2022-05-03 ENCOUNTER — Other Ambulatory Visit: Payer: Self-pay | Admitting: Cardiology

## 2022-05-05 ENCOUNTER — Encounter (HOSPITAL_COMMUNITY): Payer: Self-pay | Admitting: Gastroenterology

## 2022-05-05 NOTE — Progress Notes (Signed)
Attempted to obtain medical history via telephone, unable to reach at this time. Unable to leave voicemail to return pre surgical testing department's phone call,due to mailbox not set up.   

## 2022-05-22 DIAGNOSIS — I10 Essential (primary) hypertension: Secondary | ICD-10-CM | POA: Diagnosis not present

## 2022-05-22 DIAGNOSIS — E1169 Type 2 diabetes mellitus with other specified complication: Secondary | ICD-10-CM | POA: Diagnosis not present

## 2022-05-22 DIAGNOSIS — E785 Hyperlipidemia, unspecified: Secondary | ICD-10-CM | POA: Diagnosis not present

## 2022-05-24 ENCOUNTER — Ambulatory Visit: Payer: Self-pay

## 2022-05-24 NOTE — Patient Outreach (Signed)
  Care Coordination   Follow Up Visit Note   05/24/2022 Name: ANTIGONE CROWELL MRN: [id number] DOB: 17-Jan-1946  [name] is a 77 y.o. year old female who sees Lucianne Lei, MD for primary care. I reviewed patient's chart in preparation to contact patient. Unable to assess goal outcomes per provider request.   What matters to the patients health and wellness today?  N/a    Goals Addressed               This Visit's Progress     Patient Stated     COMPLETED: "I want to work on improving my diabetes" (pt-stated)        Care Coordination Interventions: Unable to collaborate with patient to assess for goal outcomes due to provider no longer participating in [place] program        Other     COMPLETED: I am grieving over the loss of my son        Care Coordination Interventions: Unable to collaborate with patient to assess for goal outcomes due to provider no longer participating in [place] program         SDOH assessments and interventions completed:  No     Care Coordination Interventions:  No, not indicated   Follow up plan: No further intervention required.   Encounter Outcome:  Pt. Visit Completed

## 2022-07-03 DIAGNOSIS — E1169 Type 2 diabetes mellitus with other specified complication: Secondary | ICD-10-CM | POA: Diagnosis not present

## 2022-07-03 DIAGNOSIS — E7800 Pure hypercholesterolemia, unspecified: Secondary | ICD-10-CM | POA: Diagnosis not present

## 2022-07-03 DIAGNOSIS — I10 Essential (primary) hypertension: Secondary | ICD-10-CM | POA: Diagnosis not present

## 2022-07-03 DIAGNOSIS — E785 Hyperlipidemia, unspecified: Secondary | ICD-10-CM | POA: Diagnosis not present

## 2022-07-21 ENCOUNTER — Encounter (HOSPITAL_COMMUNITY): Payer: Self-pay | Admitting: Gastroenterology

## 2022-07-21 NOTE — Progress Notes (Signed)
Attempted to obtain medical history via telephone, unable to reach at this time. Unable to leave voicemail to return pre surgical testing department's phone call,due to mailbox not set up.   

## 2022-07-28 ENCOUNTER — Encounter (HOSPITAL_COMMUNITY): Payer: Self-pay | Admitting: Certified Registered Nurse Anesthetist

## 2022-07-28 ENCOUNTER — Ambulatory Visit (HOSPITAL_COMMUNITY): Admission: RE | Admit: 2022-07-28 | Payer: 59 | Source: Home / Self Care | Admitting: Gastroenterology

## 2022-07-28 ENCOUNTER — Encounter (HOSPITAL_COMMUNITY): Admission: RE | Payer: Self-pay | Source: Home / Self Care

## 2022-07-28 SURGERY — COLONOSCOPY WITH PROPOFOL
Anesthesia: Monitor Anesthesia Care

## 2022-07-28 MED ORDER — PROPOFOL 1000 MG/100ML IV EMUL
INTRAVENOUS | Status: AC
  Filled 2022-07-28: qty 100

## 2022-07-28 MED ORDER — PROPOFOL 10 MG/ML IV BOLUS
INTRAVENOUS | Status: AC
  Filled 2022-07-28: qty 20

## 2022-07-28 NOTE — Anesthesia Preprocedure Evaluation (Signed)
Anesthesia Evaluation    Reviewed: Allergy & Precautions, Patient's Chart, lab work & pertinent test results  History of Anesthesia Complications Negative for: history of anesthetic complications  Airway        Dental   Pulmonary neg pulmonary ROS          Cardiovascular hypertension, Pt. on medications and Pt. on home beta blockers + CAD, + Past MI and + Cardiac Stents    TTE 12/22 IMPRESSIONS     1. Global longitudinal strain is -18.6%. Left ventricular ejection  fraction, by estimation, is 60 to 65%. The left ventricle has normal  function. The left ventricle has no regional wall motion abnormalities.  Left ventricular diastolic parameters are  indeterminate.   2. Right ventricular systolic function is normal. The right ventricular  size is normal.   3. The mitral valve is normal in structure. Trivial mitral valve  regurgitation.   4. AV is thickened, calcified with mildly restricted motion. Peak and  mean gradients through the valve are 17 and 8 mm Hg respectively  consistent with mild AS. Marland Kitchen The aortic valve is tricuspid. Aortic valve  regurgitation is not visualized.   Comparison(s): EF 55%, AS mean gradient 21 mmHg, peak gradient 32 mmHg,  mild thickening of MV leaflets.     Neuro/Psych negative neurological ROS     GI/Hepatic Neg liver ROS,GERD  ,,  Endo/Other  diabetes  Morbid obesity  Renal/GU negative Renal ROS     Musculoskeletal negative musculoskeletal ROS (+)    Abdominal   Peds  Hematology negative hematology ROS (+)   Anesthesia Other Findings   Reproductive/Obstetrics                             Anesthesia Physical Anesthesia Plan  ASA: 3  Anesthesia Plan: MAC   Post-op Pain Management: Minimal or no pain anticipated   Induction:   PONV Risk Score and Plan: Ondansetron and Propofol infusion  Airway Management Planned: Natural Airway  Additional  Equipment:   Intra-op Plan:   Post-operative Plan:   Informed Consent:   Plan Discussed with: Anesthesiologist  Anesthesia Plan Comments:        Anesthesia Quick Evaluation

## 2022-08-01 ENCOUNTER — Other Ambulatory Visit: Payer: Self-pay | Admitting: Cardiology

## 2022-08-01 ENCOUNTER — Other Ambulatory Visit: Payer: Self-pay | Admitting: Nurse Practitioner

## 2022-08-21 DIAGNOSIS — E1169 Type 2 diabetes mellitus with other specified complication: Secondary | ICD-10-CM | POA: Diagnosis not present

## 2022-08-21 DIAGNOSIS — I10 Essential (primary) hypertension: Secondary | ICD-10-CM | POA: Diagnosis not present

## 2022-08-21 DIAGNOSIS — E1122 Type 2 diabetes mellitus with diabetic chronic kidney disease: Secondary | ICD-10-CM | POA: Diagnosis not present

## 2022-08-21 DIAGNOSIS — E7800 Pure hypercholesterolemia, unspecified: Secondary | ICD-10-CM | POA: Diagnosis not present

## 2022-09-11 ENCOUNTER — Encounter: Payer: Self-pay | Admitting: Nurse Practitioner

## 2022-09-11 ENCOUNTER — Ambulatory Visit: Payer: 59 | Attending: Nurse Practitioner | Admitting: Nurse Practitioner

## 2022-09-11 ENCOUNTER — Other Ambulatory Visit: Payer: Self-pay

## 2022-09-11 VITALS — BP 98/64 | HR 68 | Ht 65.0 in | Wt 213.2 lb

## 2022-09-11 DIAGNOSIS — E1122 Type 2 diabetes mellitus with diabetic chronic kidney disease: Secondary | ICD-10-CM | POA: Diagnosis not present

## 2022-09-11 DIAGNOSIS — I10 Essential (primary) hypertension: Secondary | ICD-10-CM

## 2022-09-11 DIAGNOSIS — I2510 Atherosclerotic heart disease of native coronary artery without angina pectoris: Secondary | ICD-10-CM

## 2022-09-11 DIAGNOSIS — Z794 Long term (current) use of insulin: Secondary | ICD-10-CM | POA: Diagnosis not present

## 2022-09-11 DIAGNOSIS — E785 Hyperlipidemia, unspecified: Secondary | ICD-10-CM

## 2022-09-11 DIAGNOSIS — I350 Nonrheumatic aortic (valve) stenosis: Secondary | ICD-10-CM

## 2022-09-11 NOTE — Patient Instructions (Signed)
Medication Instructions:  Your physician recommends that you continue on your current medications as directed. Please refer to the Current Medication list given to you today.  *If you need a refill on your cardiac medications before your next appointment, please call your pharmacy*   Lab Work: Your physician recommends that you return for lab work in 6-8 weeks.  Fastin lipid panel & LFTs  If you have labs (blood work) drawn today and your tests are completely normal, you will receive your results only by: MyChart Message (if you have MyChart) OR A paper copy in the mail If you have any lab test that is abnormal or we need to change your treatment, we will call you to review the results.   Testing/Procedures: NONE ordered at this time of appointment     Follow-Up: At [facility], you and your health needs are our priority.  As part of our continuing mission to provide you with exceptional heart care, we have created designated Provider Care Teams.  These Care Teams include your primary Cardiologist (physician) and Advanced Practice Providers (APPs -  Physician Assistants and Nurse Practitioners) who all work together to provide you with the care you need, when you need it.  We recommend signing up for the patient portal called "MyChart".  Sign up information is provided on this After Visit Summary.  MyChart is used to connect with patients for Virtual Visits (Telemedicine).  Patients are able to view lab/test results, encounter notes, upcoming appointments, etc.  Non-urgent messages can be sent to your provider as well.   To learn more about what you can do with MyChart, go to [email or web address].    Your next appointment:   1 year(s)  Provider:   Bryan Lemma, MD     Other Instructions Monitor Blood pressure. Report systolic BP (top number) consistently less than 100.

## 2022-09-11 NOTE — Progress Notes (Signed)
Office Visit    Patient Name: Phyllis Conner Date of Encounter: 09/11/2022  Primary Care Provider:  Renaye Rakers, MD Primary Cardiologist:  Bryan Lemma, MD  Chief Complaint    77 year old female with a history of CAD s/p DES-LAD and OM1 in 2014, mild aortic stenosis, PE, hypertension, hyperlipidemia, CKD, type 2 diabetes, and obesity who presents for follow-up related to CAD.  Past Medical History    Past Medical History:  Diagnosis Date   Acid reflux    Anemia    "when I was younger"   Aortic stenosis    mild AS 09/14/17 echo   Arthritis    CAD S/P percutaneous coronary angioplasty November 2014   Mid LAD 2.5 mm x 12 mm Promus DES (2.70 mm); proximal OM1 - Promus DES 2.75 mm x 16 mm (2.85 mm)   Diabetes mellitus type 2 in obese    Essential hypertension    Headache    Hyperlipidemia with target LDL less than 70    Non-Q wave ST elevation myocardial infarction (STEMI) involving left anterior descending (LAD) coronary artery November 2014   PCI to LAD and circumflex   Obesity (BMI 30-39.9) 05/05/2013   Past Surgical History:  Procedure Laterality Date   ABDOMINAL HYSTERECTOMY  1990s   Arthroscopic knee surgery Bilateral 1984, 2002   Arthroscopic   CARDIAC CATHETERIZATION  November 2014   Mid LAD 95%, proximal OM1 80-90% --> two-vessel PCI   COLONOSCOPY N/A 12/05/2013   Procedure: COLONOSCOPY;  Surgeon: Theda Belfast, MD;  Location: [facility] ENDOSCOPY;  Service: Endoscopy;  Laterality: N/A;   COLONOSCOPY WITH PROPOFOL N/A 12/11/2014   Procedure: COLONOSCOPY WITH PROPOFOL;  Surgeon: Jeani Hawking, MD;  Location: [facility] ENDOSCOPY;  Service: Endoscopy;  Laterality: N/A;   ESOPHAGOGASTRODUODENOSCOPY N/A 12/05/2013   Procedure: ESOPHAGOGASTRODUODENOSCOPY (EGD);  Surgeon: Theda Belfast, MD;  Location: [facility];  Service: Endoscopy;  Laterality: N/A;   LEFT HEART CATHETERIZATION WITH CORONARY ANGIOGRAM N/A 02/17/2013   Procedure: LEFT HEART CATHETERIZATION WITH CORONARY ANGIOGRAM;   Surgeon: Thurmon Fair, MD;  Location: [facility] CATH LAB;  Service: Cardiovascular;  Laterality: N/A;   PERCUTANEOUS CORONARY STENT INTERVENTION (PCI-S)  November 2014   Mid LAD 2.5 mm x 12 mm (2.75 mm) Promus DES; OM1 2.75 mm x 60 mm (2.85 mm) Promus DES   TOTAL HIP ARTHROPLASTY Left 10/17/2017   TOTAL HIP ARTHROPLASTY Left 10/17/2017   Procedure: TOTAL HIP ARTHROPLASTY ANTERIOR APPROACH;  Surgeon: Gean Birchwood, MD;  Location: [facility] OR;  Service: Orthopedics;  Laterality: Left;   TRANSTHORACIC ECHOCARDIOGRAM  09/2017    Normal EF 55-60%. No RWMA.  Gr 1-2DD.  Mild AS. (mean gradient 21 mmHg)     Allergies  Allergies  Allergen Reactions   Iohexol Hives and Other (See Comments)    Excessive sweating     Labs/Other Studies Reviewed    The following studies were reviewed today:  Cardiac Studies & Procedures       ECHOCARDIOGRAM  ECHOCARDIOGRAM COMPLETE 03/21/2021  Narrative ECHOCARDIOGRAM REPORT    Patient Name:   Phyllis Conner Date of Exam: 03/21/2021 Medical Rec #:  [id number]      Height:       65.0 in Accession #:    [id number]     Weight:       209.2 lb Date of Birth:  Sep 20, 1945      BSA:          2.016 m Patient Age:    65 years  BP:           112/62 mmHg Patient Gender: F              HR:           77 bpm. Exam Location:  Outpatient  Procedure: 2D Echo, Cardiac Doppler, Color Doppler and Strain Analysis  Indications:    R07.89 Other chest pain; R06.9 DOE; I35.0 Nonrheumatic aortic (valve) stenosis  History:        Patient has prior history of Echocardiogram examinations, most recent 09/14/2017. Previous Myocardial Infarction and CAD, Aortic Valve Disease, Signs/Symptoms:Dyspnea and Chest Pain; Risk Factors:Hypertension, Diabetes, Dyslipidemia and Non-Smoker. Patient has had intermittent chest pain with DOE. She denies leg edema.  Sonographer:    Carlos American RVT, RDCS (AE), RDMS Referring Phys: [id number] Zachary George SWINYER  IMPRESSIONS   1. Global longitudinal  strain is -18.6%. Left ventricular ejection fraction, by estimation, is 60 to 65%. The left ventricle has normal function. The left ventricle has no regional wall motion abnormalities. Left ventricular diastolic parameters are indeterminate. 2. Right ventricular systolic function is normal. The right ventricular size is normal. 3. The mitral valve is normal in structure. Trivial mitral valve regurgitation. 4. AV is thickened, calcified with mildly restricted motion. Peak and mean gradients through the valve are 17 and 8 mm Hg respectively consistent with mild AS. Marland Kitchen The aortic valve is tricuspid. Aortic valve regurgitation is not visualized.  Comparison(s): EF 55%, AS mean gradient 21 mmHg, peak gradient 32 mmHg, mild thickening of MV leaflets.  FINDINGS Left Ventricle: Global longitudinal strain is -18.6%. Left ventricular ejection fraction, by estimation, is 60 to 65%. The left ventricle has normal function. The left ventricle has no regional wall motion abnormalities. The left ventricular internal cavity size was normal in size. There is no left ventricular hypertrophy. Left ventricular diastolic parameters are indeterminate.  Right Ventricle: The right ventricular size is normal. Right vetricular wall thickness was not assessed. Right ventricular systolic function is normal.  Left Atrium: Left atrial size was normal in size.  Right Atrium: Right atrial size was normal in size.  Pericardium: There is no evidence of pericardial effusion.  Mitral Valve: The mitral valve is normal in structure. Trivial mitral valve regurgitation.  Tricuspid Valve: The tricuspid valve is normal in structure. Tricuspid valve regurgitation is trivial.  Aortic Valve: AV is thickened, calcified with mildly restricted motion. Peak and mean gradients through the valve are 17 and 8 mm Hg respectively consistent with mild AS. The aortic valve is tricuspid. Aortic valve regurgitation is not visualized. Aortic valve  mean gradient measures 8.0 mmHg. Aortic valve peak gradient measures 17.3 mmHg. Aortic valve area, by VTI measures 1.77 cm.  Pulmonic Valve: The pulmonic valve was normal in structure. Pulmonic valve regurgitation is mild.  Aorta: The aortic root and ascending aorta are structurally normal, with no evidence of dilitation.  IAS/Shunts: No atrial level shunt detected by color flow Doppler.   LEFT VENTRICLE PLAX 2D LVIDd:         3.61 cm     Diastology LVIDs:         2.26 cm     LV e' medial:    5.11 cm/s LV PW:         0.97 cm     LV E/e' medial:  14.6 LV IVS:        0.84 cm     LV e' lateral:   6.85 cm/s LVOT diam:     1.90 cm  LV E/e' lateral: 10.9 LV SV:         71 LV SV Index:   35 LVOT Area:     2.84 cm  3D Volume EF: LV Volumes (MOD)           3D EF:        68 % LV vol d, MOD A2C: 65.6 ml LV EDV:       64 ml LV vol d, MOD A4C: 61.7 ml LV ESV:       21 ml LV vol s, MOD A2C: 21.1 ml LV SV:        44 ml LV vol s, MOD A4C: 25.0 ml LV SV MOD A2C:     44.5 ml LV SV MOD A4C:     61.7 ml LV SV MOD BP:      41.0 ml  RIGHT VENTRICLE RV S prime:     12.60 cm/s TAPSE (M-mode): 2.3 cm  LEFT ATRIUM             Index        RIGHT ATRIUM           Index LA diam:        2.60 cm 1.29 cm/m   RA Area:     18.10 cm LA Vol (A2C):   65.9 ml 32.68 ml/m  RA Volume:   51.00 ml  25.29 ml/m LA Vol (A4C):   55.1 ml 27.33 ml/m LA Biplane Vol: 63.3 ml 31.39 ml/m AORTIC VALVE                     PULMONIC VALVE AV Area (Vmax):    1.51 cm      PV Vmax:          0.89 m/s AV Area (Vmean):   1.53 cm      PV Peak grad:     3.1 mmHg AV Area (VTI):     1.77 cm      PR End Diast Vel: 5.66 msec AV Vmax:           208.00 cm/s AV Vmean:          132.000 cm/s AV VTI:            0.399 m AV Peak Grad:      17.3 mmHg AV Mean Grad:      8.0 mmHg LVOT Vmax:         111.00 cm/s LVOT Vmean:        71.000 cm/s LVOT VTI:          0.249 m LVOT/AV VTI ratio: 0.62  AORTA Ao Root diam: 2.85 cm Ao Asc  diam:  3.50 cm Ao Arch diam: 3.0 cm  MITRAL VALVE               TRICUSPID VALVE MV Area (PHT): 2.78 cm    TR Peak grad:   25.0 mmHg MV Decel Time: 273 msec    TR Vmax:        250.00 cm/s MV E velocity: 74.40 cm/s MV A velocity: 99.90 cm/s  SHUNTS MV E/A ratio:  0.74        Systemic VTI:  0.25 m Systemic Diam: 1.90 cm  Dietrich Pates MD Electronically signed by Dietrich Pates MD Signature Date/Time: 03/21/2021/5:26:55 PM    Final            Recent Labs: 09/14/2021: BUN 28; Creatinine, Ser 1.55; Potassium 4.4; Sodium 141  Recent Lipid Panel  Component Value Date/Time   CHOL 93 03/08/2021 1300   TRIG 74 03/08/2021 1300   HDL 44 03/08/2021 1300   CHOLHDL 2.1 03/08/2021 1300   VLDL 15 03/08/2021 1300   LDLCALC 34 03/08/2021 1300    History of Present Illness    77 year old female with the above past medical history including CAD s/p DES-LAD and OM1 in 2014, mild aortic stenosis, PE, hypertension, hyperlipidemia, CKD, type 2 diabetes, and obesity.  She has a history of PE but was diagnosed in 2011 in the setting of syncope.  She underwent stenting to her LAD and OM1 in 2014 setting of NSTEMI.  She was evaluated in the [facility] [address], ED in 01/2022 in the setting atypical chest pain.  EKG was unremarkable, troponin was negative.  Most recent echocardiogram in 03/2021 revealed EF 60 to 65%, normal LV function, no RWMA, normal RV, mild aortic stenosis.  She follows with nephrology for history of CKD.  She was last seen in the office on 09/07/2021 and was stable from a cardiac standpoint.  She denied symptoms concerning for angina.  She presents today for follow-up.  Since her last visit she has done well from a cardiac standpoint.  She does note occasional burning in her chest after eating heavy foods such as hamburgers, she denies any other symptoms concerning for angina.  BP is somewhat low in office today.  She notes occasional lightheadedness, denies any presyncope, syncope.  Overall, she  reports feeling well.  Home Medications    Current Outpatient Medications  Medication Sig Dispense Refill   ACCU-CHEK AVIVA PLUS test strip 1 each by Other route 2 (two) times daily.   2   amLODipine (NORVASC) 10 MG tablet Take 1 tablet (10 mg total) by mouth daily. 90 tablet 2   aspirin EC 81 MG tablet Take 81 mg by mouth daily. Swallow whole.     cycloSPORINE (RESTASIS) 0.05 % ophthalmic emulsion Place 1 drop into both eyes 2 (two) times daily.     Evolocumab (REPATHA SURECLICK) 140 MG/ML SOAJ Inject 140 mg into the skin every 14 (fourteen) days. 2 mL 12   FARXIGA 10 MG TABS tablet Take 10 mg by mouth every morning.     furosemide (LASIX) 20 MG tablet TAKE 1 TABLET(20 MG) BY MOUTH DAILY 90 tablet 0   HUMALOG 100 UNIT/ML injection Inject 20 Units into the skin continuous.   0   hydrochlorothiazide (HYDRODIURIL) 25 MG tablet TAKE 1 TABLET(25 MG) BY MOUTH DAILY 90 tablet 2   Insulin Disposable Pump (V-GO 20) KIT continuous. Humalog  3   isosorbide mononitrate (IMDUR) 30 MG 24 hr tablet TAKE 1 TABLET(30 MG) BY MOUTH DAILY 90 tablet 2   losartan (COZAAR) 25 MG tablet TAKE 1 TABLET(25 MG) BY MOUTH DAILY 90 tablet 3   metoprolol tartrate (LOPRESSOR) 25 MG tablet Take 1 tablet (25 mg total) by mouth 2 (two) times daily. 90 tablet 3   nitroGLYCERIN (NITROSTAT) 0.4 MG SL tablet Place 1 tablet (0.4 mg total) under the tongue every 5 (five) minutes x 3 doses as needed for chest pain. 25 tablet 2   OZEMPIC, 0.25 OR 0.5 MG/DOSE, 2 MG/3ML SOPN Inject 0.5 mg into the skin once a week.     potassium chloride SA (K-DUR,KLOR-CON) 20 MEQ tablet Take 10 mEq by mouth daily.  2   rosuvastatin (CRESTOR) 40 MG tablet TAKE 1 TABLET(40 MG) BY MOUTH DAILY 90 tablet 1   spironolactone (ALDACTONE) 25 MG tablet TAKE 1 TABLET(25 MG) BY  MOUTH DAILY 90 tablet 3   TRULICITY 1.5 MG/0.5ML SOPN Inject 1.5 mg into the skin once a week. Takes every Monday (Patient not taking: Reported on 09/11/2022)     No current  facility-administered medications for this visit.     Review of Systems    She denies chest pain, palpitations, dyspnea, pnd, orthopnea, n, v, dizziness, syncope, edema, weight gain, or early satiety. All other systems reviewed and are otherwise negative except as noted above.   Physical Exam    VS:  BP 98/64   Pulse 68   Ht 5\' 5"  (1.651 m)   Wt 213 lb 3.2 oz (96.7 kg)   SpO2 98%   BMI 35.48 kg/m  GEN: Well nourished, well developed, in no acute distress. HEENT: normal. Neck: Supple, no JVD, carotid bruits, or masses. Cardiac: RRR, no murmurs, rubs, or gallops. No clubbing, cyanosis, edema.  Radials/DP/PT 2+ and equal bilaterally.  Respiratory:  Respirations regular and unlabored, clear to auscultation bilaterally. GI: Soft, nontender, nondistended, BS + x 4. MS: no deformity or atrophy. Skin: warm and dry, no rash. Neuro:  Strength and sensation are intact. Psych: Normal affect.  Accessory Clinical Findings    ECG personally reviewed by me today -NSR, 68 bpm- no acute changes.   Lab Results  Component Value Date   WBC 8.2 01/23/2021   HGB 10.5 (L) 01/23/2021   HCT 32.6 (L) 01/23/2021   MCV 96.7 01/23/2021   PLT 189 01/23/2021   Lab Results  Component Value Date   CREATININE 1.55 (H) 09/14/2021   BUN 28 (H) 09/14/2021   NA 141 09/14/2021   K 4.4 09/14/2021   CL 107 (H) 09/14/2021   CO2 23 09/14/2021   Lab Results  Component Value Date   ALT 10 03/08/2021   AST 13 (L) 03/08/2021   ALKPHOS 67 03/08/2021   BILITOT 0.4 03/08/2021   Lab Results  Component Value Date   CHOL 93 03/08/2021   HDL 44 03/08/2021   LDLCALC 34 03/08/2021   TRIG 74 03/08/2021   CHOLHDL 2.1 03/08/2021    Lab Results  Component Value Date   HGBA1C 7.7 (H) 10/17/2017    Assessment & Plan    1. CAD: S/p DES-LAD and OM1 in 2014. Stable with no anginal symptoms. No indication for ischemic evaluation.  Continue aspirin, amlodipine, losartan, metoprolol, hydrochlorothiazide,  spironolactone, Imdur, Crestor and Repatha.  2. Mild aortic stenosis: Mild on most recent echo in 03/2021.  She notes occasional lightheadedness with position changes, denies any dizziness, presyncope, syncope.  Euvolemic and well compensated on exam.  Consider repeat echo as clinically indicated.  3. Hypertension: BP generally well controlled, low in office today.  She notes occasional lightheadedness with position changes, denies any dizziness, presyncope, syncope.  Continue to monitor BP and report SBP consistently < 100.  For now, continue current antihypertensive regimen.   4. Hyperlipidemia: LDL was 92 in 06/2022.  Above goal.  She was not taking her Repatha at this time.  Will repeat fasting lipid panel, LFTs in 6 to 8 weeks.  Continue Crestor, Repatha.  5. CKD: Creatinine was 1.82 in 06/2022. Stable.   6. Type 2 diabetes: A1c was 10.8 in 08/2022.  Monitored and managed per PCP.  7. Disposition: Follow-up in 1 year, sooner if needed.      Joylene Grapes, NP  09/11/2022, 10:57 AM

## 2022-10-23 DIAGNOSIS — E559 Vitamin D deficiency, unspecified: Secondary | ICD-10-CM | POA: Diagnosis not present

## 2022-10-23 DIAGNOSIS — M150 Primary generalized (osteo)arthritis: Secondary | ICD-10-CM | POA: Diagnosis not present

## 2022-10-23 DIAGNOSIS — I351 Nonrheumatic aortic (valve) insufficiency: Secondary | ICD-10-CM | POA: Diagnosis not present

## 2022-10-23 DIAGNOSIS — G479 Sleep disorder, unspecified: Secondary | ICD-10-CM | POA: Diagnosis not present

## 2022-10-23 DIAGNOSIS — I10 Essential (primary) hypertension: Secondary | ICD-10-CM | POA: Diagnosis not present

## 2022-10-23 DIAGNOSIS — E1169 Type 2 diabetes mellitus with other specified complication: Secondary | ICD-10-CM | POA: Diagnosis not present

## 2022-10-23 DIAGNOSIS — I2510 Atherosclerotic heart disease of native coronary artery without angina pectoris: Secondary | ICD-10-CM | POA: Diagnosis not present

## 2022-10-29 ENCOUNTER — Other Ambulatory Visit: Payer: Self-pay | Admitting: Cardiology

## 2022-11-06 ENCOUNTER — Other Ambulatory Visit (HOSPITAL_COMMUNITY): Payer: Self-pay | Admitting: Family Medicine

## 2022-11-06 DIAGNOSIS — Z1231 Encounter for screening mammogram for malignant neoplasm of breast: Secondary | ICD-10-CM

## 2022-11-09 ENCOUNTER — Other Ambulatory Visit: Payer: Self-pay | Admitting: Cardiology

## 2022-11-16 ENCOUNTER — Ambulatory Visit (HOSPITAL_COMMUNITY): Payer: 59

## 2022-11-18 ENCOUNTER — Other Ambulatory Visit: Payer: Self-pay | Admitting: Cardiology

## 2022-12-06 DIAGNOSIS — Z1211 Encounter for screening for malignant neoplasm of colon: Secondary | ICD-10-CM | POA: Diagnosis not present

## 2022-12-06 DIAGNOSIS — E119 Type 2 diabetes mellitus without complications: Secondary | ICD-10-CM | POA: Diagnosis not present

## 2022-12-06 DIAGNOSIS — I2510 Atherosclerotic heart disease of native coronary artery without angina pectoris: Secondary | ICD-10-CM | POA: Diagnosis not present

## 2022-12-12 DIAGNOSIS — H6120 Impacted cerumen, unspecified ear: Secondary | ICD-10-CM | POA: Diagnosis not present

## 2022-12-12 DIAGNOSIS — I10 Essential (primary) hypertension: Secondary | ICD-10-CM | POA: Diagnosis not present

## 2022-12-12 DIAGNOSIS — H6121 Impacted cerumen, right ear: Secondary | ICD-10-CM | POA: Diagnosis not present

## 2022-12-12 DIAGNOSIS — E1169 Type 2 diabetes mellitus with other specified complication: Secondary | ICD-10-CM | POA: Diagnosis not present

## 2022-12-20 ENCOUNTER — Encounter (HOSPITAL_COMMUNITY): Payer: Self-pay

## 2022-12-20 ENCOUNTER — Ambulatory Visit (HOSPITAL_COMMUNITY)
Admission: RE | Admit: 2022-12-20 | Discharge: 2022-12-20 | Disposition: A | Discharge status: Home / Self Care | Payer: 59 | Source: Ambulatory Visit | Attending: Family Medicine | Admitting: Family Medicine

## 2022-12-20 DIAGNOSIS — Z1231 Encounter for screening mammogram for malignant neoplasm of breast: Secondary | ICD-10-CM | POA: Diagnosis not present

## 2023-01-12 ENCOUNTER — Other Ambulatory Visit: Payer: Self-pay | Admitting: Physician Assistant

## 2023-01-23 DIAGNOSIS — I252 Old myocardial infarction: Secondary | ICD-10-CM | POA: Diagnosis not present

## 2023-01-23 DIAGNOSIS — I10 Essential (primary) hypertension: Secondary | ICD-10-CM | POA: Diagnosis not present

## 2023-01-23 DIAGNOSIS — M150 Primary generalized (osteo)arthritis: Secondary | ICD-10-CM | POA: Diagnosis not present

## 2023-01-23 DIAGNOSIS — E1169 Type 2 diabetes mellitus with other specified complication: Secondary | ICD-10-CM | POA: Diagnosis not present

## 2023-01-23 DIAGNOSIS — E785 Hyperlipidemia, unspecified: Secondary | ICD-10-CM | POA: Diagnosis not present

## 2023-01-23 DIAGNOSIS — Z23 Encounter for immunization: Secondary | ICD-10-CM | POA: Diagnosis not present

## 2023-01-23 DIAGNOSIS — H6120 Impacted cerumen, unspecified ear: Secondary | ICD-10-CM | POA: Diagnosis not present

## 2023-02-07 ENCOUNTER — Other Ambulatory Visit: Payer: Self-pay | Admitting: Gastroenterology

## 2023-03-23 ENCOUNTER — Encounter (HOSPITAL_COMMUNITY): Payer: Self-pay | Admitting: Gastroenterology

## 2023-03-30 ENCOUNTER — Ambulatory Visit (HOSPITAL_COMMUNITY)
Admission: RE | Admit: 2023-03-30 | Discharge: 2023-03-30 | Disposition: A | Discharge status: Home / Self Care | Payer: 59 | Attending: Gastroenterology | Admitting: Gastroenterology

## 2023-03-30 ENCOUNTER — Other Ambulatory Visit: Payer: Self-pay

## 2023-03-30 ENCOUNTER — Ambulatory Visit (HOSPITAL_BASED_OUTPATIENT_CLINIC_OR_DEPARTMENT_OTHER): Payer: 59 | Admitting: Anesthesiology

## 2023-03-30 ENCOUNTER — Encounter (HOSPITAL_COMMUNITY): Payer: Self-pay | Admitting: Gastroenterology

## 2023-03-30 ENCOUNTER — Encounter (HOSPITAL_COMMUNITY)
Admission: RE | Disposition: A | Discharge status: Home / Self Care | Payer: Self-pay | Source: Home / Self Care | Attending: Gastroenterology

## 2023-03-30 ENCOUNTER — Ambulatory Visit (HOSPITAL_COMMUNITY): Payer: 59 | Admitting: Anesthesiology

## 2023-03-30 DIAGNOSIS — K5730 Diverticulosis of large intestine without perforation or abscess without bleeding: Secondary | ICD-10-CM

## 2023-03-30 DIAGNOSIS — I252 Old myocardial infarction: Secondary | ICD-10-CM | POA: Insufficient documentation

## 2023-03-30 DIAGNOSIS — Z860100 Personal history of colon polyps, unspecified: Secondary | ICD-10-CM | POA: Diagnosis not present

## 2023-03-30 DIAGNOSIS — K5790 Diverticulosis of intestine, part unspecified, without perforation or abscess without bleeding: Secondary | ICD-10-CM | POA: Diagnosis not present

## 2023-03-30 DIAGNOSIS — I10 Essential (primary) hypertension: Secondary | ICD-10-CM | POA: Diagnosis not present

## 2023-03-30 DIAGNOSIS — Z79899 Other long term (current) drug therapy: Secondary | ICD-10-CM | POA: Insufficient documentation

## 2023-03-30 DIAGNOSIS — Z794 Long term (current) use of insulin: Secondary | ICD-10-CM | POA: Insufficient documentation

## 2023-03-30 DIAGNOSIS — E119 Type 2 diabetes mellitus without complications: Secondary | ICD-10-CM | POA: Insufficient documentation

## 2023-03-30 DIAGNOSIS — Z1211 Encounter for screening for malignant neoplasm of colon: Secondary | ICD-10-CM | POA: Diagnosis not present

## 2023-03-30 DIAGNOSIS — I2510 Atherosclerotic heart disease of native coronary artery without angina pectoris: Secondary | ICD-10-CM | POA: Diagnosis not present

## 2023-03-30 HISTORY — PX: COLONOSCOPY WITH PROPOFOL: SHX5780

## 2023-03-30 LAB — GLUCOSE, CAPILLARY
Glucose-Capillary: 157 mg/dL — ABNORMAL HIGH (ref 70–99)
Glucose-Capillary: 117 mg/dL — ABNORMAL HIGH (ref 70–99)

## 2023-03-30 SURGERY — COLONOSCOPY WITH PROPOFOL
Anesthesia: Monitor Anesthesia Care

## 2023-03-30 MED ORDER — PROPOFOL 10 MG/ML IV BOLUS
INTRAVENOUS | Status: DC | PRN
  Administered 2023-03-30: 10 mg via INTRAVENOUS
  Administered 2023-03-30: 30 mg via INTRAVENOUS

## 2023-03-30 MED ORDER — LIDOCAINE HCL 1 % IJ SOLN
INTRAMUSCULAR | Status: DC | PRN
  Administered 2023-03-30: 60 mg via INTRADERMAL
  Administered 2023-03-30: 40 mg via INTRADERMAL

## 2023-03-30 MED ORDER — EPHEDRINE SULFATE (PRESSORS) 50 MG/ML IJ SOLN
INTRAMUSCULAR | Status: DC | PRN
  Administered 2023-03-30 (×4): 5 mg via INTRAVENOUS

## 2023-03-30 MED ORDER — PROPOFOL 500 MG/50ML IV EMUL
INTRAVENOUS | Status: DC | PRN
  Administered 2023-03-30: 125 ug/kg/min via INTRAVENOUS

## 2023-03-30 MED ORDER — PHENYLEPHRINE HCL (PRESSORS) 10 MG/ML IV SOLN
INTRAVENOUS | Status: DC | PRN
  Administered 2023-03-30: 40 ug via INTRAVENOUS
  Administered 2023-03-30 (×3): 160 ug via INTRAVENOUS
  Administered 2023-03-30: 40 ug via INTRAVENOUS
  Administered 2023-03-30: 160 ug via INTRAVENOUS

## 2023-03-30 MED ORDER — PROPOFOL 500 MG/50ML IV EMUL
INTRAVENOUS | Status: AC
  Filled 2023-03-30: qty 50

## 2023-03-30 MED ORDER — PROPOFOL 500 MG/50ML IV EMUL
INTRAVENOUS | Status: AC
Start: 2023-03-30 — End: ?
  Filled 2023-03-30: qty 50

## 2023-03-30 MED ORDER — SODIUM CHLORIDE 0.9 % IV SOLN: INTRAVENOUS | Status: DC | PRN

## 2023-03-30 SURGICAL SUPPLY — 20 items
ELECT REM PT RETURN 9FT ADLT (ELECTROSURGICAL)
ELECTRODE REM PT RTRN 9FT ADLT (ELECTROSURGICAL) IMPLANT
FCP BXJMBJMB 240X2.8X (CUTTING FORCEPS)
FLOOR PAD 36X40 (MISCELLANEOUS) ×1
FORCEPS BIOP RAD 4 LRG CAP 4 (CUTTING FORCEPS) IMPLANT
FORCEPS BXJMBJMB 240X2.8X (CUTTING FORCEPS) IMPLANT
INJECTOR/SNARE I SNARE (MISCELLANEOUS) IMPLANT
LUBRICANT JELLY 4.5OZ STERILE (MISCELLANEOUS) IMPLANT
MANIFOLD NEPTUNE II (INSTRUMENTS) IMPLANT
NDL SCLEROTHERAPY 25GX240 (NEEDLE) IMPLANT
NEEDLE SCLEROTHERAPY 25GX240 (NEEDLE) IMPLANT
PAD FLOOR 36X40 (MISCELLANEOUS) ×2 IMPLANT
PROBE APC STR FIRE (PROBE) IMPLANT
PROBE INJECTION GOLD 7FR (MISCELLANEOUS) IMPLANT
SNARE ROTATE MED OVAL 20MM (MISCELLANEOUS) IMPLANT
SYR 50ML LL SCALE MARK (SYRINGE) IMPLANT
TRAP SPECIMEN MUCOUS 40CC (MISCELLANEOUS) IMPLANT
TUBING ENDO SMARTCAP PENTAX (MISCELLANEOUS) IMPLANT
TUBING IRRIGATION ENDOGATOR (MISCELLANEOUS) ×2 IMPLANT
WATER STERILE IRR 1000ML POUR (IV SOLUTION) IMPLANT

## 2023-03-30 NOTE — Anesthesia Postprocedure Evaluation (Signed)
Anesthesia Post Note  Patient: Phyllis Conner  Procedure(s) Performed: COLONOSCOPY WITH PROPOFOL     Patient location during evaluation: PACU Anesthesia Type: MAC Level of consciousness: awake and alert Pain management: pain level controlled Vital Signs Assessment: post-procedure vital signs reviewed and stable Respiratory status: spontaneous breathing, nonlabored ventilation and respiratory function stable Cardiovascular status: blood pressure returned to baseline Postop Assessment: no apparent nausea or vomiting Anesthetic complications: no   No notable events documented.  Last Vitals:  Vitals:   03/30/23 1010 03/30/23 1015  BP: (!) 109/48 (!) 111/49  Pulse: 61 64  Resp: 12   Temp:    SpO2: 99% 99%    Last Pain:  Vitals:   03/30/23 1010  TempSrc:   PainSc: 0-No pain                 Shanda Howells

## 2023-03-30 NOTE — Discharge Instructions (Signed)

## 2023-03-30 NOTE — Transfer of Care (Signed)
Immediate Anesthesia Transfer of Care Note  Patient: Phyllis Conner  Procedure(s) Performed: COLONOSCOPY WITH PROPOFOL  Patient Location: PACU and Endoscopy Unit  Anesthesia Type:MAC  Level of Consciousness: oriented, drowsy, and patient cooperative  Airway & Oxygen Therapy: Patient Spontanous Breathing and Patient connected to face mask oxygen  Post-op Assessment: Report given to RN and Post -op Vital signs reviewed and stable  Post vital signs: Reviewed and stable  Last Vitals:  Vitals Value Taken Time  BP 102/37 03/30/23 0932  Temp 36.1 C 03/30/23 0930  Pulse 72 03/30/23 0933  Resp 17 03/30/23 0933  SpO2 100 % 03/30/23 0933  Vitals shown include unfiled device data.  Last Pain:  Vitals:   03/30/23 0930  TempSrc: Temporal  PainSc: 0-No pain         Complications: No notable events documented.

## 2023-03-30 NOTE — H&P (Signed)
Phyllis Conner HPI: For various reasons she was not able to have her colonoscopy last year.  One time she forgot and ate.  Right now she feels ready for the procedure.  Since the last visit she denies any issues with new TIAs, SOB, or MI.  She has intermittent minor chest pains that are fleeting.  Her colonoscopy on 12/11/2014 was positive for a small adenoma.  Past Medical History:  Diagnosis Date   Acid reflux    Anemia    "when I was younger"   Aortic stenosis    mild AS 09/14/17 echo   Arthritis    CAD S/P percutaneous coronary angioplasty November 2014   Mid LAD 2.5 mm x 12 mm Promus DES (2.70 mm); proximal OM1 - Promus DES 2.75 mm x 16 mm (2.85 mm)   Diabetes mellitus type 2 in obese    Essential hypertension    Headache    Hyperlipidemia with target LDL less than 70    Non-Q wave ST elevation myocardial infarction (STEMI) involving left anterior descending (LAD) coronary artery November 2014   PCI to LAD and circumflex   Obesity (BMI 30-39.9) 05/05/2013    Past Surgical History:  Procedure Laterality Date   ABDOMINAL HYSTERECTOMY  1990s   Arthroscopic knee surgery Bilateral 1984, 2002   Arthroscopic   CARDIAC CATHETERIZATION  November 2014   Mid LAD 95%, proximal OM1 80-90% --> two-vessel PCI   COLONOSCOPY N/A 12/05/2013   Procedure: COLONOSCOPY;  Surgeon: Theda Belfast, MD;  Location: [facility];  Service: Endoscopy;  Laterality: N/A;   COLONOSCOPY WITH PROPOFOL N/A 12/11/2014   Procedure: COLONOSCOPY WITH PROPOFOL;  Surgeon: Jeani Hawking, MD;  Location: [facility];  Service: Endoscopy;  Laterality: N/A;   ESOPHAGOGASTRODUODENOSCOPY N/A 12/05/2013   Procedure: ESOPHAGOGASTRODUODENOSCOPY (EGD);  Surgeon: Theda Belfast, MD;  Location: [facility];  Service: Endoscopy;  Laterality: N/A;   LEFT HEART CATHETERIZATION WITH CORONARY ANGIOGRAM N/A 02/17/2013   Procedure: LEFT HEART CATHETERIZATION WITH CORONARY ANGIOGRAM;  Surgeon: Thurmon Fair, MD;  Location: [facility];   Service: Cardiovascular;  Laterality: N/A;   PERCUTANEOUS CORONARY STENT INTERVENTION (PCI-S)  November 2014   Mid LAD 2.5 mm x 12 mm (2.75 mm) Promus DES; OM1 2.75 mm x 60 mm (2.85 mm) Promus DES   TOTAL HIP ARTHROPLASTY Left 10/17/2017   TOTAL HIP ARTHROPLASTY Left 10/17/2017   Procedure: TOTAL HIP ARTHROPLASTY ANTERIOR APPROACH;  Surgeon: Gean Birchwood, MD;  Location: [facility] OR;  Service: Orthopedics;  Laterality: Left;   TRANSTHORACIC ECHOCARDIOGRAM  09/2017    Normal EF 55-60%. No RWMA.  Gr 1-2DD.  Mild AS. (mean gradient 21 mmHg)     Family History  Problem Relation Age of Onset   Cancer Father        prostate cancer   Heart disease Mother    Heart attack Mother    Asthma Mother    Heart disease Sister    Heart disease Brother     Social History:  reports that she has never smoked. She has never used smokeless tobacco. She reports that she does not drink alcohol and does not use drugs.  Allergies:  Allergies  Allergen Reactions   Iohexol Hives and Other (See Comments)    Excessive sweating    Medications: Scheduled: Continuous:  Results for orders placed or performed during the hospital encounter of 03/30/23 (from the past 24 hours)  Glucose, capillary     Status: Abnormal   Collection Time: 03/30/23  8:11 AM  Result Value Ref Range   Glucose-Capillary 157 (H) 70 - 99 mg/dL     No results found.  ROS:  As stated above in the HPI otherwise negative.  Blood pressure (!) 112/57, pulse 73, temperature (!) 97.5 F (36.4 C), temperature source Tympanic, resp. rate 16, height 5\' 5"  (1.651 m), weight 87.5 kg, SpO2 100%.    PE: Gen: NAD, Alert and Oriented HEENT:  [place]/AT, EOMI Neck: Supple, no LAD Lungs: CTA Bilaterally CV: RRR without M/G/R ABD: Soft, NTND, +BS Ext: No C/C/E  Assessment/Plan: 1) Personal history of polyps - colonoscopy.  Phyllis Conner D 03/30/2023, 8:55 AM

## 2023-03-30 NOTE — Anesthesia Procedure Notes (Addendum)
Procedure Name: MAC Date/Time: 03/30/2023 8:58 AM  Performed by: Sindy Guadeloupe, CRNAPre-anesthesia Checklist: Patient identified, Emergency Drugs available, Suction available, Patient being monitored and Timeout performed Oxygen Delivery Method: Simple face mask Placement Confirmation: positive ETCO2

## 2023-03-30 NOTE — Op Note (Signed)
[facility] Patient Name: Phyllis Conner Procedure Date: 03/30/2023 MRN: [id number] Attending MD: Jeani Hawking , MD, [id number] Date of Birth: [birth year] CSN: [id number] Age: 77 Admit Type: Outpatient Procedure:                Colonoscopy Indications:              High risk colon cancer surveillance: Personal                            history of colonic polyps Providers:                Jeani Hawking, MD, Rogue Jury, RN, Fransisca Connors, Rhodia Albright, Technician, Salley Scarlet,                            Technician Referring MD:             Jeani Hawking, MD Medicines:                Propofol per Anesthesia Complications:            No immediate complications. Estimated Blood Loss:     Estimated blood loss: none. Procedure:                Pre-Anesthesia Assessment:                           - Prior to the procedure, a History and Physical                            was performed, and patient medications and                            allergies were reviewed. The patient's tolerance of                            previous anesthesia was also reviewed. The risks                            and benefits of the procedure and the sedation                            options and risks were discussed with the patient.                            All questions were answered, and informed consent                            was obtained. Prior Anticoagulants: The patient has                            taken no anticoagulant or antiplatelet agents. ASA                            Grade Assessment:  III - A patient with severe                            systemic disease. After reviewing the risks and                            benefits, the patient was deemed in satisfactory                            condition to undergo the procedure.                           - Sedation was administered by an anesthesia                            professional. Deep  sedation was attained.                           After obtaining informed consent, the colonoscope                            was passed under direct vision. Throughout the                            procedure, the patient's blood pressure, pulse, and                            oxygen saturations were monitored continuously. The                            CF-HQ190L ([id number]) Olympus colonoscope was                            introduced through the anus and advanced to the the                            cecum, identified by appendiceal orifice and                            ileocecal valve. The colonoscopy was somewhat                            difficult due to significant looping. Successful                            completion of the procedure was aided by using                            manual pressure and straightening and shortening                            the scope to obtain bowel loop reduction. The  patient tolerated the procedure well. The quality                            of the bowel preparation was evaluated using the                            BBPS [facility] Bowel Preparation Scale) with scores                            of: Right Colon = 3 (entire mucosa seen well with                            no residual staining, small fragments of stool or                            opaque liquid), Transverse Colon = 3 (entire mucosa                            seen well with no residual staining, small                            fragments of stool or opaque liquid) and Left Colon                            = 2 (minor amount of residual staining, small                            fragments of stool and/or opaque liquid, but mucosa                            seen well). The total BBPS score equals 8. The                            quality of the bowel preparation was good. The                            ileocecal valve, appendiceal orifice, and rectum                             were photographed. Scope In: 9:03:40 AM Scope Out: 9:21:38 AM Scope Withdrawal Time: 0 hours 9 minutes 13 seconds  Total Procedure Duration: 0 hours 17 minutes 58 seconds  Findings:      Scattered medium-mouthed and small-mouthed diverticula were found in the       sigmoid colon. Impression:               - Diverticulosis in the sigmoid colon.                           - No specimens collected. Moderate Sedation:      Not Applicable - Patient had care per Anesthesia. Recommendation:           - Patient has a contact number available for  emergencies. The signs and symptoms of potential                            delayed complications were discussed with the                            patient. Return to normal activities tomorrow.                            Written discharge instructions were provided to the                            patient.                           - Resume previous diet.                           - Continue present medications.                           - Repeat colonoscopy is not recommended for                            surveillance. Procedure Code(s):        --- Professional ---                           [phone], Colonoscopy, flexible; diagnostic, including                            collection of specimen(s) by brushing or washing,                            when performed (separate procedure) Diagnosis Code(s):        --- Professional ---                           Z86.010, Personal history of colonic polyps                           K57.30, Diverticulosis of large intestine without                            perforation or abscess without bleeding CPT copyright 2022 American Medical Association. All rights reserved. The codes documented in this report are preliminary and upon coder review may  be revised to meet current compliance requirements. Jeani Hawking, MD Jeani Hawking, MD 03/30/2023 9:28:05 AM This report has  been signed electronically. Number of Addenda: 0

## 2023-03-30 NOTE — Anesthesia Preprocedure Evaluation (Addendum)
Anesthesia Evaluation  Patient identified by MRN, date of birth, ID band Patient awake    Reviewed: Allergy & Precautions, NPO status , Patient's Chart, lab work & pertinent test results  History of Anesthesia Complications Negative for: history of anesthetic complications  Airway Mallampati: II  TM Distance: >3 FB Neck ROM: Full    Dental  (+) Missing, Edentulous Upper, Loose,    Pulmonary neg pulmonary ROS   Pulmonary exam normal        Cardiovascular hypertension, Pt. on medications + CAD, + Past MI and + Cardiac Stents (2014)  Normal cardiovascular exam  TTE 2022: EF 60-65%, mild AS   Neuro/Psych negative neurological ROS     GI/Hepatic Neg liver ROS,GERD  ,,  Endo/Other  diabetes, Type 2, Insulin Dependent    Renal/GU Renal InsufficiencyRenal disease     Musculoskeletal  (+) Arthritis ,    Abdominal   Peds  Hematology negative hematology ROS (+)   Anesthesia Other Findings Day of surgery medications reviewed with patient.  Reproductive/Obstetrics                              Anesthesia Physical Anesthesia Plan  ASA: 2  Anesthesia Plan: MAC   Post-op Pain Management: Minimal or no pain anticipated   Induction:   PONV Risk Score and Plan: 2 and Treatment may vary due to age or medical condition and Propofol infusion  Airway Management Planned: Natural Airway and Simple Face Mask  Additional Equipment: None  Intra-op Plan:   Post-operative Plan:   Informed Consent: I have reviewed the patients History and Physical, chart, labs and discussed the procedure including the risks, benefits and alternatives for the proposed anesthesia with the patient or authorized representative who has indicated his/her understanding and acceptance.       Plan Discussed with: CRNA  Anesthesia Plan Comments:          Anesthesia Quick Evaluation

## 2023-03-31 ENCOUNTER — Encounter (HOSPITAL_COMMUNITY): Payer: Self-pay | Admitting: Gastroenterology

## 2023-04-27 ENCOUNTER — Other Ambulatory Visit: Payer: Self-pay | Admitting: Physician Assistant

## 2023-05-21 DIAGNOSIS — H25813 Combined forms of age-related cataract, bilateral: Secondary | ICD-10-CM | POA: Diagnosis not present

## 2023-05-25 DIAGNOSIS — I10 Essential (primary) hypertension: Secondary | ICD-10-CM | POA: Diagnosis not present

## 2023-05-25 DIAGNOSIS — Z0000 Encounter for general adult medical examination without abnormal findings: Secondary | ICD-10-CM | POA: Diagnosis not present

## 2023-05-25 DIAGNOSIS — E785 Hyperlipidemia, unspecified: Secondary | ICD-10-CM | POA: Diagnosis not present

## 2023-05-25 DIAGNOSIS — E1169 Type 2 diabetes mellitus with other specified complication: Secondary | ICD-10-CM | POA: Diagnosis not present

## 2023-07-05 DIAGNOSIS — N189 Chronic kidney disease, unspecified: Secondary | ICD-10-CM | POA: Diagnosis not present

## 2023-07-05 DIAGNOSIS — E1169 Type 2 diabetes mellitus with other specified complication: Secondary | ICD-10-CM | POA: Diagnosis not present

## 2023-07-05 DIAGNOSIS — D649 Anemia, unspecified: Secondary | ICD-10-CM | POA: Diagnosis not present

## 2023-07-05 DIAGNOSIS — E1122 Type 2 diabetes mellitus with diabetic chronic kidney disease: Secondary | ICD-10-CM | POA: Diagnosis not present

## 2023-07-19 DIAGNOSIS — H01002 Unspecified blepharitis right lower eyelid: Secondary | ICD-10-CM | POA: Diagnosis not present

## 2023-07-19 DIAGNOSIS — H01001 Unspecified blepharitis right upper eyelid: Secondary | ICD-10-CM | POA: Diagnosis not present

## 2023-07-19 DIAGNOSIS — H25813 Combined forms of age-related cataract, bilateral: Secondary | ICD-10-CM | POA: Diagnosis not present

## 2023-07-19 DIAGNOSIS — H01004 Unspecified blepharitis left upper eyelid: Secondary | ICD-10-CM | POA: Diagnosis not present

## 2023-08-30 DIAGNOSIS — H25812 Combined forms of age-related cataract, left eye: Secondary | ICD-10-CM | POA: Diagnosis not present

## 2023-09-01 ENCOUNTER — Other Ambulatory Visit: Payer: Self-pay | Admitting: Cardiology

## 2023-09-04 ENCOUNTER — Other Ambulatory Visit: Payer: Self-pay | Admitting: Cardiology

## 2023-09-05 ENCOUNTER — Encounter (HOSPITAL_COMMUNITY): Payer: Self-pay

## 2023-09-05 ENCOUNTER — Encounter (HOSPITAL_COMMUNITY)
Admission: RE | Admit: 2023-09-05 | Discharge: 2023-09-05 | Disposition: A | Discharge status: Home / Self Care | Source: Ambulatory Visit | Attending: Ophthalmology | Admitting: Ophthalmology

## 2023-09-05 HISTORY — DX: Other complications of anesthesia, initial encounter: T88.59XA

## 2023-09-05 NOTE — H&P (Signed)
 Surgical History & Physical  Patient Name: Phyllis Conner  DOB: [birth year]  Surgery: Cataract extraction with intraocular lens implant phacoemulsification; Left Eye Surgeon: [name] MD Surgery Date: 09/07/2023 Pre-Op Date: 07/19/2023  HPI: A 23 Yr. old female patient 1. The patient is returning for a cataract evaluation of both eyes. The patients vision is blurry. The conditions severity is constant. The complaint is associated with difficulty with glare on bright sunny days, difficulty seeing small print on medicine bottles/labels, and difficulty writing checks/filling out forms. This is negatively affecting the patients quality of life and the patient is unable to function adequately in life with the current level of vision.  HPI Completed by Dr. [name]  Medical History: Dry Eyes Cataracts  Diabetes Heart Problem High Blood Pressure Cholesterol  Review of Systems Negative Allergic/Immunologic Hypertension, heart problems Cardiovascular Negative Constitutional Negative Ear, Nose, Mouth & Throat Diabetes Endocrine Cataracts Eyes Negative Gastrointestinal Negative Genitourinary Negative Hemotologic/Lymphatic Negative Integumentary Negative Musculoskeletal Negative Neurological Negative Psychiatry Negative Respiratory  Social Never smoked   Medication Restasis,  Amlodipine , Furosemide , Hydrochlorothiazide , Metoprolol  tartrate, Rosuvastatin , Potassium chloride , Repatha  SureClick, Ozempic, Prednisolone acetate, Ilevro, Moxifloxacin  Sx/Procedures Heart stents, Hip Replacement   Drug Allergies  blue dye   History & Physical: Heent: cataracts NECK: supple without bruits LUNGS: lungs clear to auscultation CV: regular rate and rhythm Abdomen: soft and non-tender  Impression & Plan: Assessment: 1.  COMBINED FORMS AGE RELATED CATARACT; Both Eyes (H25.813) 2.  BLEPHARITIS; Right Upper Lid, Right Lower Lid, Left Upper Lid, Left Lower Lid (H01.001,  H01.002,H01.004,H01.005) 3.  ARCUS SENILIS; Both Eyes (H18.413) 4.  Pinguecula; Both Eyes (H11.153) 5.  ASTIGMATISM, REGULAR; Both Eyes (H52.223)  Plan: 1.  Cataract accounts for the patient's decreased vision. This visual impairment is not correctable with a tolerable change in glasses or contact lenses. Cataract surgery with an implantation of a new lens should significantly improve the visual and functional status of the patient. Discussed all risks, benefits, alternatives, and potential complications. Discussed the procedures and recovery. Patient desires to have surgery. A-scan ordered and performed today for intra-ocular lens calculations. The surgery will be performed in order to improve vision for driving, reading, and for eye examinations. Recommend phacoemulsification with intra-ocular lens. Recommend Dextenza  for post-operative pain and inflammation. Dilates well - shugarcaine by protocol. Vision Blue - dense cortical, in room.. Left Eye worse - first.  2.  Blepharitis is present - recommend regular lid cleaning.  3.  Discussed significance of finding Answered patient questions about finding  4.  Observe; Artificial tears as needed for irritation.  5.  Monitor.

## 2023-09-07 ENCOUNTER — Ambulatory Visit (HOSPITAL_COMMUNITY): Admitting: Anesthesiology

## 2023-09-07 ENCOUNTER — Encounter (HOSPITAL_COMMUNITY)
Admission: RE | Disposition: A | Discharge status: Home / Self Care | Payer: Self-pay | Source: Home / Self Care | Attending: Ophthalmology

## 2023-09-07 ENCOUNTER — Ambulatory Visit (HOSPITAL_COMMUNITY)
Admission: RE | Admit: 2023-09-07 | Discharge: 2023-09-07 | Disposition: A | Discharge status: Home / Self Care | Attending: Ophthalmology | Admitting: Ophthalmology

## 2023-09-07 ENCOUNTER — Encounter (HOSPITAL_COMMUNITY): Payer: Self-pay | Admitting: Ophthalmology

## 2023-09-07 DIAGNOSIS — E785 Hyperlipidemia, unspecified: Secondary | ICD-10-CM

## 2023-09-07 DIAGNOSIS — Z7984 Long term (current) use of oral hypoglycemic drugs: Secondary | ICD-10-CM | POA: Diagnosis not present

## 2023-09-07 DIAGNOSIS — H2181 Floppy iris syndrome: Secondary | ICD-10-CM | POA: Insufficient documentation

## 2023-09-07 DIAGNOSIS — H52223 Regular astigmatism, bilateral: Secondary | ICD-10-CM | POA: Insufficient documentation

## 2023-09-07 DIAGNOSIS — H2512 Age-related nuclear cataract, left eye: Secondary | ICD-10-CM | POA: Diagnosis not present

## 2023-09-07 DIAGNOSIS — I10 Essential (primary) hypertension: Secondary | ICD-10-CM

## 2023-09-07 DIAGNOSIS — I2510 Atherosclerotic heart disease of native coronary artery without angina pectoris: Secondary | ICD-10-CM | POA: Diagnosis not present

## 2023-09-07 DIAGNOSIS — H18413 Arcus senilis, bilateral: Secondary | ICD-10-CM | POA: Diagnosis not present

## 2023-09-07 DIAGNOSIS — E1136 Type 2 diabetes mellitus with diabetic cataract: Secondary | ICD-10-CM | POA: Insufficient documentation

## 2023-09-07 DIAGNOSIS — Z7985 Long-term (current) use of injectable non-insulin antidiabetic drugs: Secondary | ICD-10-CM | POA: Insufficient documentation

## 2023-09-07 DIAGNOSIS — Z794 Long term (current) use of insulin: Secondary | ICD-10-CM | POA: Diagnosis not present

## 2023-09-07 DIAGNOSIS — H25813 Combined forms of age-related cataract, bilateral: Secondary | ICD-10-CM | POA: Diagnosis not present

## 2023-09-07 DIAGNOSIS — I252 Old myocardial infarction: Secondary | ICD-10-CM | POA: Diagnosis not present

## 2023-09-07 DIAGNOSIS — H11153 Pinguecula, bilateral: Secondary | ICD-10-CM | POA: Insufficient documentation

## 2023-09-07 DIAGNOSIS — E119 Type 2 diabetes mellitus without complications: Secondary | ICD-10-CM | POA: Diagnosis not present

## 2023-09-07 DIAGNOSIS — H25812 Combined forms of age-related cataract, left eye: Secondary | ICD-10-CM | POA: Diagnosis not present

## 2023-09-07 DIAGNOSIS — H0100A Unspecified blepharitis right eye, upper and lower eyelids: Secondary | ICD-10-CM | POA: Insufficient documentation

## 2023-09-07 DIAGNOSIS — H0100B Unspecified blepharitis left eye, upper and lower eyelids: Secondary | ICD-10-CM | POA: Insufficient documentation

## 2023-09-07 HISTORY — PX: CATARACT EXTRACTION W/PHACO: SHX586

## 2023-09-07 LAB — GLUCOSE, CAPILLARY: Glucose-Capillary: 115 mg/dL — ABNORMAL HIGH (ref 70–99)

## 2023-09-07 SURGERY — PHACOEMULSIFICATION, CATARACT, WITH IOL INSERTION
Anesthesia: Monitor Anesthesia Care | Site: Eye | Laterality: Left

## 2023-09-07 MED ORDER — POVIDONE-IODINE 5 % OP SOLN
OPHTHALMIC | Status: DC | PRN
  Administered 2023-09-07: 1 via OPHTHALMIC

## 2023-09-07 MED ORDER — TETRACAINE HCL 0.5 % OP SOLN
1.0000 [drp] | OPHTHALMIC | Status: AC | PRN
  Administered 2023-09-07 (×3): 1 [drp] via OPHTHALMIC

## 2023-09-07 MED ORDER — STERILE WATER FOR IRRIGATION IR SOLN
Status: DC | PRN
Start: 2023-09-07 — End: 2023-09-09
  Administered 2023-09-07: 200 mL

## 2023-09-07 MED ORDER — TROPICAMIDE 1 % OP SOLN
1.0000 [drp] | OPHTHALMIC | Status: AC | PRN
Start: 2023-09-07 — End: 2023-09-07
  Administered 2023-09-07 (×3): 1 [drp] via OPHTHALMIC

## 2023-09-07 MED ORDER — PHENYLEPHRINE-KETOROLAC 1-0.3 % IO SOLN
INTRAOCULAR | Status: DC | PRN
  Administered 2023-09-07: 500 mL via OPHTHALMIC

## 2023-09-07 MED ORDER — LIDOCAINE HCL (PF) 1 % IJ SOLN
INTRAMUSCULAR | Status: DC | PRN
  Administered 2023-09-07: 1 mL

## 2023-09-07 MED ORDER — SODIUM HYALURONATE 23MG/ML IO SOSY
PREFILLED_SYRINGE | INTRAOCULAR | Status: DC | PRN
Start: 2023-09-07 — End: 2023-09-09
  Administered 2023-09-07: .6 mL via INTRAOCULAR

## 2023-09-07 MED ORDER — PHENYLEPHRINE HCL 2.5 % OP SOLN
1.0000 [drp] | OPHTHALMIC | Status: AC | PRN
Start: 2023-09-07 — End: 2023-09-07
  Administered 2023-09-07 (×3): 1 [drp] via OPHTHALMIC

## 2023-09-07 MED ORDER — SODIUM HYALURONATE 10 MG/ML IO SOLUTION
PREFILLED_SYRINGE | INTRAOCULAR | Status: DC | PRN
  Administered 2023-09-07: .85 mL via INTRAOCULAR

## 2023-09-07 MED ORDER — LIDOCAINE HCL 3.5 % OP GEL
1.0000 | Freq: Once | OPHTHALMIC | Status: AC
  Administered 2023-09-07: 1 via OPHTHALMIC

## 2023-09-07 MED ORDER — BSS IO SOLN
INTRAOCULAR | Status: DC | PRN
  Administered 2023-09-07: 15 mL via INTRAOCULAR

## 2023-09-07 MED ORDER — MOXIFLOXACIN HCL 5 MG/ML IO SOLN
INTRAOCULAR | Status: DC | PRN
  Administered 2023-09-07: .2 mL via INTRACAMERAL

## 2023-09-07 SURGICAL SUPPLY — 13 items
CATARACT SUITE SIGHTPATH (MISCELLANEOUS) ×1 IMPLANT
CLOTH BEACON ORANGE TIMEOUT ST (SAFETY) ×2 IMPLANT
EYE SHIELD UNIVERSAL CLEAR (GAUZE/BANDAGES/DRESSINGS) IMPLANT
FEE CATARACT SUITE SIGHTPATH (MISCELLANEOUS) ×2 IMPLANT
GLOVE BIOGEL PI IND STRL 7.0 (GLOVE) ×4 IMPLANT
LENS IOL TECNIS EYHANCE 21.5 (Intraocular Lens) IMPLANT
NDL HYPO 18GX1.5 BLUNT FILL (NEEDLE) ×2 IMPLANT
NEEDLE HYPO 18GX1.5 BLUNT FILL (NEEDLE) ×1 IMPLANT
PAD ARMBOARD POSITIONER FOAM (MISCELLANEOUS) ×2 IMPLANT
RING MALYGIN 7.0 (MISCELLANEOUS) IMPLANT
SYR TB 1ML LL NO SAFETY (SYRINGE) ×2 IMPLANT
TAPE SURG TRANSPORE 1 IN (GAUZE/BANDAGES/DRESSINGS) IMPLANT
WATER STERILE IRR 250ML POUR (IV SOLUTION) ×2 IMPLANT

## 2023-09-07 NOTE — Op Note (Signed)
 Date of procedure: 09/07/23  Pre-operative diagnosis: Visually significant age-related cataract, Left Eye; Poor dilation, Left eye (H25.?2)   Post-operative diagnosis: Visually significant age-related cataract, Left Eye; Intra-operative Floppy Iris Syndrome, Left Eye (H21.81)  Procedure: Complex removal of cataract via phacoemulsification and insertion of intra-ocular lens [employer] DIB00 +21.5D into the capsular bag of the Left Eye (CPT [phone])  Attending surgeon: Pleas Brill. Charnise Lovan, MD, MA  Anesthesia: MAC, Topical Akten  Complications: None  Estimated Blood Loss: <14mL (minimal)  Specimens: None  Implants: As above  Indications:  Visually significant cataract, Left Eye  Procedure:  The patient was seen and identified in the pre-operative area. The operative eye was identified and dilated.  The operative eye was marked.  Topical anesthesia was administered to the operative eye.     The patient was then to the operative suite and placed in the supine position.  A timeout was performed confirming the patient, procedure to be performed, and all other relevant information.   The patient's face was prepped and draped in the usual fashion for intra-ocular surgery.  A lid speculum was placed into the operative eye and the surgical microscope moved into place and focused.  Poor dilation of the iris was confirmed.  An inferotemporal paracentesis was created using a 20 gauge paracentesis blade.  Shugarcaine was injected into the anterior chamber.  Viscoelastic was injected into the anterior chamber.  A temporal clear-corneal main wound incision was created using a 2.51mm microkeratome.  A Malyugin ring was placed.  A continuous curvilinear capsulorrhexis was initiated using an irrigating cystitome and completed using capsulorrhexis forceps.  Hydrodissection and hydrodeliniation were performed.  Viscoelastic was injected into the anterior chamber.  A phacoemulsification handpiece and a chopper  as a second instrument were used to remove the nucleus and epinucleus. The irrigation/aspiration handpiece was used to remove any remaining cortical material.   The capsular bag was reinflated with viscoelastic, checked, and found to be intact.  The intraocular lens was inserted into the capsular bag and dialed into place using a Customer service manager. The Malyugin ring was removed.  The irrigation/aspiration handpiece was used to remove any remaining viscoelastic.  The clear corneal wound and paracentesis wounds were then hydrated and checked with Weck-Cels to be watertight. 0.1mL of Moxifloxacin was injected into the anterior chamber. The lid-speculum and drape was removed, and the patient's face was cleaned with a wet and dry 4x4. A clear shield was taped over the eye. The patient was taken to the post-operative care unit in good condition, having tolerated the procedure well.  Post-Op Instructions: The patient will follow up at [facility] for a same day post-operative evaluation and will receive all other orders and instructions.

## 2023-09-07 NOTE — Transfer of Care (Signed)
 Immediate Anesthesia Transfer of Care Note  Patient: Phyllis Conner  Procedure(s) Performed: PHACOEMULSIFICATION, CATARACT, WITH IOL INSERTION (Left: Eye)  Patient Location: PACU  Anesthesia Type:MAC  Level of Consciousness: awake  Airway & Oxygen  Therapy: Patient Spontanous Breathing and Patient connected to nasal cannula oxygen   Post-op Assessment: Report given to RN and Post -op Vital signs reviewed and stable  Post vital signs: Reviewed and stable  Last Vitals:  Vitals Value Taken Time  BP 112/64 09/07/23 1142  Temp 36.7 C 09/07/23 1141  Pulse 60 09/07/23 1141  Resp 16 09/07/23 1141  SpO2 100 % 09/07/23 1142    Last Pain:  Vitals:   09/07/23 1142  TempSrc:   PainSc: 0-No pain      Patients Stated Pain Goal: 7 (09/07/23 1142)  Complications: No notable events documented.

## 2023-09-07 NOTE — Anesthesia Preprocedure Evaluation (Signed)
 Anesthesia Evaluation  Patient identified by MRN, date of birth, ID band Patient awake    Reviewed: Allergy & Precautions, H&P , NPO status , Patient's Chart, lab work & pertinent test results, reviewed documented beta blocker date and time   History of Anesthesia Complications (+) history of anesthetic complications  Airway Mallampati: II  TM Distance: >3 FB Neck ROM: full    Dental no notable dental hx.    Pulmonary neg pulmonary ROS   Pulmonary exam normal breath sounds clear to auscultation       Cardiovascular Exercise Tolerance: Good hypertension, + CAD, + Past MI and + Orthopnea   Rhythm:regular Rate:Normal     Neuro/Psych  Headaches  negative psych ROS   GI/Hepatic Neg liver ROS,GERD  ,,  Endo/Other  diabetes    Renal/GU Renal disease  negative genitourinary   Musculoskeletal   Abdominal   Peds  Hematology  (+) Blood dyscrasia, anemia   Anesthesia Other Findings   Reproductive/Obstetrics negative OB ROS                             Anesthesia Physical Anesthesia Plan  ASA: 3  Anesthesia Plan: MAC   Post-op Pain Management:    Induction:   PONV Risk Score and Plan:   Airway Management Planned:   Additional Equipment:   Intra-op Plan:   Post-operative Plan:   Informed Consent: I have reviewed the patients History and Physical, chart, labs and discussed the procedure including the risks, benefits and alternatives for the proposed anesthesia with the patient or authorized representative who has indicated his/her understanding and acceptance.     Dental Advisory Given  Plan Discussed with: CRNA  Anesthesia Plan Comments:        Anesthesia Quick Evaluation

## 2023-09-07 NOTE — Interval H&P Note (Signed)
 History and Physical Interval Note:  09/07/2023 11:10 AM  Phyllis Conner  has presented today for surgery, with the diagnosis of combined forms age related cataract, left eye.  The various methods of treatment have been discussed with the patient and family. After consideration of risks, benefits and other options for treatment, the patient has consented to  Procedure(s): PHACOEMULSIFICATION, CATARACT, WITH IOL INSERTION (Left) as a surgical intervention.  The patient's history has been reviewed, patient examined, no change in status, stable for surgery.  I have reviewed the patient's chart and labs.  Questions were answered to the patient's satisfaction.     [name]

## 2023-09-07 NOTE — Discharge Instructions (Addendum)
 Please discharge patient when stable, will follow up today with Dr. June Leap at the [facility] office immediately following discharge.  Leave shield in place until visit.  All paperwork with discharge instructions will be given at the office.  [facility] Address:  [address]  [place], [place] [address]

## 2023-09-09 NOTE — Anesthesia Postprocedure Evaluation (Signed)
 Anesthesia Post Note  Patient: Phyllis Conner  Procedure(s) Performed: PHACOEMULSIFICATION, CATARACT, WITH IOL INSERTION (Left: Eye)  Patient location during evaluation: Phase II Anesthesia Type: MAC Level of consciousness: awake Pain management: pain level controlled Vital Signs Assessment: post-procedure vital signs reviewed and stable Respiratory status: spontaneous breathing and respiratory function stable Cardiovascular status: blood pressure returned to baseline and stable Postop Assessment: no headache and no apparent nausea or vomiting Anesthetic complications: no Comments: Late entry   No notable events documented.   Last Vitals:  Vitals:   09/07/23 1141 09/07/23 1142  BP:  112/64  Pulse: 60   Resp: 16   Temp: 36.7 C   SpO2:  100%    Last Pain:  Vitals:   09/07/23 1142  TempSrc:   PainSc: 0-No pain                 Coretha Dew

## 2023-09-10 ENCOUNTER — Encounter (HOSPITAL_COMMUNITY): Payer: Self-pay | Admitting: Ophthalmology

## 2023-09-19 ENCOUNTER — Ambulatory Visit: Attending: Cardiology | Admitting: Cardiology

## 2023-09-24 ENCOUNTER — Encounter (HOSPITAL_COMMUNITY)
Admission: RE | Admit: 2023-09-24 | Discharge: 2023-09-24 | Disposition: A | Discharge status: Home / Self Care | Source: Ambulatory Visit | Attending: Ophthalmology | Admitting: Ophthalmology

## 2023-09-24 ENCOUNTER — Encounter (HOSPITAL_COMMUNITY): Payer: Self-pay

## 2023-09-24 DIAGNOSIS — H25811 Combined forms of age-related cataract, right eye: Secondary | ICD-10-CM | POA: Diagnosis not present

## 2023-09-27 NOTE — H&P (Signed)
 Surgical History & Physical  Patient Name: Phyllis Conner  DOB: 09-01-1945  Surgery: Cataract extraction with intraocular lens implant phacoemulsification; Right Eye Surgeon: [name] MD Surgery Date: 09/28/2023 Pre-Op Date: 09/20/2023  HPI: A [name] old female patient 1. The patient is returning after cataract surgery. Both eyes are affected. Status post cataract surgery, which began 3 days ago: Since the last visit, the affected area feels improvement. The patient's vision is improved. The condition's severity is constant. Patient is following medication instructions. 2. 2. The patient is returning for a cataract follow-up of the right eye. Since the last visit, the affected area is tolerating. The patient's vision is blurry. The condition's severity is constant. Patient is not taking medications. This is negatively affecting the patient's quality of life and the patient is unable to function adequately in life with the current level of vision. HPI Completed by Dr. [name]  Medical History: Dry Eyes Cataracts  Diabetes Heart Problem High Blood Pressure Cholesterol  Review of Systems Negative Allergic/Immunologic Hypertension, heart stents Cardiovascular Negative Constitutional Negative Ear, Nose, Mouth & Throat Diabetes Endocrine Cataract Eyes Negative Gastrointestinal Negative Genitourinary Negative Hemotologic/Lymphatic Negative Integumentary Negative Musculoskeletal Negative Neurological Negative Psychiatry Negative Respiratory  Social Never smoked   Medication Restasis, Prednisolone acetate, Ilevro, Moxifloxacin , Ilevro, Prednisolone acetate, Moxifloxacin ,  Amlodipine , Furosemide , Hydrochlorothiazide , Metoprolol  tartrate, Rosuvastatin , Potassium chloride , Repatha  SureClick, Ozempic  Sx/Procedures Phaco c IOL OS,  Heart stents, Hip Replacement, No pacemaker or other devices  Drug Allergies  blue dye   History & Physical: Heent: cataract NECK: supple  without bruits LUNGS: lungs clear to auscultation CV: regular rate and rhythm Abdomen: soft and non-tender  Impression & Plan: Assessment: 1.  CATARACT EXTRACTION STATUS; Left Eye (Z98.42) 2.  COMBINED FORMS AGE RELATED CATARACT; Right Eye (H25.811)  Plan: 1.  2 weeks after cataract surgery. Doing well with improved vision and normal eye pressure. Call with any problems or concerns. Stop Vigamox . Continue Ilevro 1 drop 1x/day for 3 more weeks. Continue Pred Acetate 1 drop 2x/day for 3 more weeks.  2.  Cataract accounts for the patient's decreased vision. This visual impairment is not correctable with a tolerable change in glasses or contact lenses. Cataract surgery with an implantation of a new lens should significantly improve the visual and functional status of the patient.Discussed all risks, benefits, alternatives, and potential complications. Discussed the procedures and recovery. Patient desires to have surgery. A-scan ordered and performed today for intra-ocular lens calculations. The surgery will be performed in order to improve vision for driving, reading, and for eye examinations. Recommend phacoemulsification with intra-ocular lens. Recommend Dextenza  for post-operative pain and inflammation. History of refractive Surgery: None Use of Eye Pressure Lowering Drops: None Right Eye. Surgery required to correct imbalance of vision. Dilates well - shugarcaine or Lidocaine +Omidira by protocol

## 2023-09-28 ENCOUNTER — Ambulatory Visit (HOSPITAL_COMMUNITY): Admitting: Anesthesiology

## 2023-09-28 ENCOUNTER — Ambulatory Visit (HOSPITAL_COMMUNITY)
Admission: RE | Admit: 2023-09-28 | Discharge: 2023-09-28 | Disposition: A | Discharge status: Home / Self Care | Attending: Ophthalmology | Admitting: Ophthalmology

## 2023-09-28 ENCOUNTER — Encounter (HOSPITAL_COMMUNITY): Payer: Self-pay | Admitting: Ophthalmology

## 2023-09-28 ENCOUNTER — Encounter (HOSPITAL_COMMUNITY)
Admission: RE | Disposition: A | Discharge status: Home / Self Care | Payer: Self-pay | Source: Home / Self Care | Attending: Ophthalmology

## 2023-09-28 DIAGNOSIS — I10 Essential (primary) hypertension: Secondary | ICD-10-CM | POA: Insufficient documentation

## 2023-09-28 DIAGNOSIS — E119 Type 2 diabetes mellitus without complications: Secondary | ICD-10-CM | POA: Diagnosis not present

## 2023-09-28 DIAGNOSIS — H25811 Combined forms of age-related cataract, right eye: Secondary | ICD-10-CM

## 2023-09-28 DIAGNOSIS — Z79899 Other long term (current) drug therapy: Secondary | ICD-10-CM | POA: Diagnosis not present

## 2023-09-28 DIAGNOSIS — I252 Old myocardial infarction: Secondary | ICD-10-CM | POA: Insufficient documentation

## 2023-09-28 DIAGNOSIS — Z7984 Long term (current) use of oral hypoglycemic drugs: Secondary | ICD-10-CM | POA: Diagnosis not present

## 2023-09-28 DIAGNOSIS — N289 Disorder of kidney and ureter, unspecified: Secondary | ICD-10-CM | POA: Diagnosis not present

## 2023-09-28 DIAGNOSIS — Z974 Presence of external hearing-aid: Secondary | ICD-10-CM | POA: Diagnosis not present

## 2023-09-28 DIAGNOSIS — E1136 Type 2 diabetes mellitus with diabetic cataract: Secondary | ICD-10-CM | POA: Insufficient documentation

## 2023-09-28 DIAGNOSIS — I2510 Atherosclerotic heart disease of native coronary artery without angina pectoris: Secondary | ICD-10-CM | POA: Insufficient documentation

## 2023-09-28 DIAGNOSIS — K219 Gastro-esophageal reflux disease without esophagitis: Secondary | ICD-10-CM | POA: Diagnosis not present

## 2023-09-28 DIAGNOSIS — Z7985 Long-term (current) use of injectable non-insulin antidiabetic drugs: Secondary | ICD-10-CM | POA: Diagnosis not present

## 2023-09-28 HISTORY — PX: CATARACT EXTRACTION W/PHACO: SHX586

## 2023-09-28 LAB — GLUCOSE, CAPILLARY: Glucose-Capillary: 103 mg/dL — ABNORMAL HIGH (ref 70–99)

## 2023-09-28 SURGERY — PHACOEMULSIFICATION, CATARACT, WITH IOL INSERTION
Anesthesia: Monitor Anesthesia Care | Site: Eye | Laterality: Right

## 2023-09-28 MED ORDER — MOXIFLOXACIN HCL 5 MG/ML IO SOLN
INTRAOCULAR | Status: DC | PRN
  Administered 2023-09-28: .2 mL via OPHTHALMIC

## 2023-09-28 MED ORDER — PHENYLEPHRINE HCL 2.5 % OP SOLN
1.0000 [drp] | OPHTHALMIC | Status: AC | PRN
  Administered 2023-09-28 (×3): 1 [drp] via OPHTHALMIC

## 2023-09-28 MED ORDER — BSS IO SOLN
INTRAOCULAR | Status: DC | PRN
  Administered 2023-09-28: 15 mL via INTRAOCULAR

## 2023-09-28 MED ORDER — PHENYLEPHRINE-KETOROLAC 1-0.3 % IO SOLN
INTRAOCULAR | Status: DC | PRN
  Administered 2023-09-28: 500 mL via OPHTHALMIC

## 2023-09-28 MED ORDER — LACTATED RINGERS IV SOLN: INTRAVENOUS | Status: DC

## 2023-09-28 MED ORDER — LIDOCAINE HCL 3.5 % OP GEL
1.0000 | Freq: Once | OPHTHALMIC | Status: AC
  Administered 2023-09-28: 1 via OPHTHALMIC

## 2023-09-28 MED ORDER — STERILE WATER FOR IRRIGATION IR SOLN
Status: DC | PRN
  Administered 2023-09-28: 250 mL

## 2023-09-28 MED ORDER — POVIDONE-IODINE 5 % OP SOLN
OPHTHALMIC | Status: DC | PRN
Start: 2023-09-28 — End: 2023-09-28
  Administered 2023-09-28: 1 via OPHTHALMIC

## 2023-09-28 MED ORDER — SODIUM HYALURONATE 23MG/ML IO SOSY
PREFILLED_SYRINGE | INTRAOCULAR | Status: DC | PRN
  Administered 2023-09-28: .6 mL via INTRAOCULAR

## 2023-09-28 MED ORDER — TROPICAMIDE 1 % OP SOLN
1.0000 [drp] | OPHTHALMIC | Status: AC | PRN
  Administered 2023-09-28 (×3): 1 [drp] via OPHTHALMIC

## 2023-09-28 MED ORDER — SODIUM HYALURONATE 10 MG/ML IO SOLUTION
PREFILLED_SYRINGE | INTRAOCULAR | Status: DC | PRN
  Administered 2023-09-28: .85 mL via INTRAOCULAR

## 2023-09-28 MED ORDER — TETRACAINE HCL 0.5 % OP SOLN
1.0000 [drp] | OPHTHALMIC | Status: AC | PRN
  Administered 2023-09-28 (×3): 1 [drp] via OPHTHALMIC

## 2023-09-28 SURGICAL SUPPLY — 12 items
CATARACT SUITE SIGHTPATH (MISCELLANEOUS) ×1 IMPLANT
CLOTH BEACON ORANGE TIMEOUT ST (SAFETY) ×2 IMPLANT
EYE SHIELD UNIVERSAL CLEAR (GAUZE/BANDAGES/DRESSINGS) IMPLANT
FEE CATARACT SUITE SIGHTPATH (MISCELLANEOUS) ×2 IMPLANT
GLOVE BIOGEL PI IND STRL 7.0 (GLOVE) ×4 IMPLANT
LENS IOL TECNIS EYHANCE 20.0 (Intraocular Lens) IMPLANT
NDL HYPO 18GX1.5 BLUNT FILL (NEEDLE) ×2 IMPLANT
NEEDLE HYPO 18GX1.5 BLUNT FILL (NEEDLE) ×1 IMPLANT
PAD ARMBOARD POSITIONER FOAM (MISCELLANEOUS) ×2 IMPLANT
SYR TB 1ML LL NO SAFETY (SYRINGE) ×2 IMPLANT
TAPE SURG TRANSPORE 1 IN (GAUZE/BANDAGES/DRESSINGS) IMPLANT
WATER STERILE IRR 250ML POUR (IV SOLUTION) ×2 IMPLANT

## 2023-09-28 NOTE — Op Note (Signed)
 Date of procedure: 09/28/23  Pre-operative diagnosis:  Visually significant combined form age-related cataract, Right Eye (H25.811)  Post-operative diagnosis:  Visually significant combined form age-related cataract, Right Eye (H25.811)  Procedure: Removal of cataract via phacoemulsification and insertion of intra-ocular lens [employer] DIB00 +20.0D into the capsular bag of the Right Eye  Attending surgeon: Pleas Brill. Charlissa Petros, MD, MA  Anesthesia: MAC, Topical Akten   Complications: None  Estimated Blood Loss: <59mL (minimal)  Specimens: None  Implants: As above  Indications:  Visually significant age-related cataract, Right Eye  Procedure:  The patient was seen and identified in the pre-operative area. The operative eye was identified and dilated.  The operative eye was marked.  Topical anesthesia was administered to the operative eye.     The patient was then to the operative suite and placed in the supine position.  A timeout was performed confirming the patient, procedure to be performed, and all other relevant information.   The patient's face was prepped and draped in the usual fashion for intra-ocular surgery.  A lid speculum was placed into the operative eye and the surgical microscope moved into place and focused.  A superotemporal paracentesis was created using a 20 gauge paracentesis blade. Omidria  was injected into the anterior chamber. Shugarcaine was injected into the anterior chamber.  Viscoelastic was injected into the anterior chamber.  A temporal clear-corneal main wound incision was created using a 2.77mm microkeratome.  A continuous curvilinear capsulorrhexis was initiated using an irrigating cystitome and completed using capsulorrhexis forceps.  Hydrodissection and hydrodeliniation were performed.  Viscoelastic was injected into the anterior chamber.  A phacoemulsification handpiece and a chopper as a second instrument were used to remove the nucleus and epinucleus.  The irrigation/aspiration handpiece was used to remove any remaining cortical material.   The capsular bag was reinflated with viscoelastic, checked, and found to be intact.  The intraocular lens was inserted into the capsular bag.  The irrigation/aspiration handpiece was used to remove any remaining viscoelastic.  The clear corneal wound and paracentesis wounds were then hydrated and checked with Weck-Cels to be watertight. 0.1mL of Moxfloxacin was injected into the anterior chamber. The lid-speculum was removed.  The drape was removed.  The patient's face was cleaned with a wet and dry 4x4. A clear shield was taped over the eye. The patient was taken to the post-operative care unit in good condition, having tolerated the procedure well.  Post-Op Instructions: The patient will follow up at [facility] for a same day post-operative evaluation and will receive all other orders and instructions.

## 2023-09-28 NOTE — Anesthesia Preprocedure Evaluation (Signed)
 Anesthesia Evaluation  Patient identified by MRN, date of birth, ID band Patient awake    Reviewed: Allergy & Precautions, H&P , NPO status , Patient's Chart, lab work & pertinent test results, reviewed documented beta blocker date and time   History of Anesthesia Complications (+) history of anesthetic complications  Airway Mallampati: II  TM Distance: >3 FB Neck ROM: full    Dental no notable dental hx.    Pulmonary neg pulmonary ROS   Pulmonary exam normal breath sounds clear to auscultation       Cardiovascular Exercise Tolerance: Good hypertension, + CAD, + Past MI and + Orthopnea   Rhythm:regular Rate:Normal     Neuro/Psych  Headaches  negative psych ROS   GI/Hepatic Neg liver ROS,GERD  ,,  Endo/Other  diabetes    Renal/GU Renal disease  negative genitourinary   Musculoskeletal   Abdominal   Peds  Hematology  (+) Blood dyscrasia, anemia   Anesthesia Other Findings   Reproductive/Obstetrics negative OB ROS                             Anesthesia Physical Anesthesia Plan  ASA: 3  Anesthesia Plan: MAC   Post-op Pain Management:    Induction:   PONV Risk Score and Plan:   Airway Management Planned:   Additional Equipment:   Intra-op Plan:   Post-operative Plan:   Informed Consent: I have reviewed the patients History and Physical, chart, labs and discussed the procedure including the risks, benefits and alternatives for the proposed anesthesia with the patient or authorized representative who has indicated his/her understanding and acceptance.     Dental Advisory Given  Plan Discussed with: CRNA  Anesthesia Plan Comments:        Anesthesia Quick Evaluation

## 2023-09-28 NOTE — Interval H&P Note (Signed)
 History and Physical Interval Note:  09/28/2023 9:15 AM  Phyllis Conner  has presented today for surgery, with the diagnosis of combined forms age related cataract, right eye.  The various methods of treatment have been discussed with the patient and family. After consideration of risks, benefits and other options for treatment, the patient has consented to  Procedure(s) with comments: PHACOEMULSIFICATION, CATARACT, WITH IOL INSERTION (Right) - CDE: as a surgical intervention.  The patient's history has been reviewed, patient examined, no change in status, stable for surgery.  I have reviewed the patient's chart and labs.  Questions were answered to the patient's satisfaction.     [name]

## 2023-09-28 NOTE — Anesthesia Procedure Notes (Addendum)
 Procedure Name: MAC Date/Time: 09/28/2023 9:17 AM  Performed by: Sherwin Donate, CRNAPre-anesthesia Checklist: Patient identified, Emergency Drugs available, Suction available and Patient being monitored Patient Re-evaluated:Patient Re-evaluated prior to induction Oxygen  Delivery Method: Nasal cannula Placement Confirmation: positive ETCO2

## 2023-09-28 NOTE — Transfer of Care (Signed)
 Immediate Anesthesia Transfer of Care Note  Patient: Phyllis Conner  Procedure(s) Performed: PHACOEMULSIFICATION, CATARACT, WITH IOL INSERTION (Right: Eye)  Patient Location: Short Stay  Anesthesia Type:MAC  Level of Consciousness: awake, alert , and oriented  Airway & Oxygen  Therapy: Patient Spontanous Breathing  Post-op Assessment: Report given to RN and Post -op Vital signs reviewed and stable  Post vital signs: Reviewed and stable  Last Vitals:  Vitals Value Taken Time  BP    Temp    Pulse    Resp    SpO2      Last Pain:  Vitals:   09/28/23 0807  PainSc: 0-No pain         Complications: No notable events documented.

## 2023-09-28 NOTE — Discharge Instructions (Signed)
 Please discharge patient when stable, will follow up today with Dr. June Leap at the [facility] office immediately following discharge.  Leave shield in place until visit.  All paperwork with discharge instructions will be given at the office.  [facility] Address:  [address]  [place], [place] [address]

## 2023-09-29 NOTE — Anesthesia Postprocedure Evaluation (Signed)
 Anesthesia Post Note  Patient: Phyllis Conner  Procedure(s) Performed: PHACOEMULSIFICATION, CATARACT, WITH IOL INSERTION (Right: Eye)  Patient location during evaluation: Phase II Anesthesia Type: MAC Level of consciousness: awake Pain management: pain level controlled Vital Signs Assessment: post-procedure vital signs reviewed and stable Respiratory status: spontaneous breathing and respiratory function stable Cardiovascular status: blood pressure returned to baseline and stable Postop Assessment: no headache and no apparent nausea or vomiting Anesthetic complications: no Comments: Late entry   No notable events documented.   Last Vitals:  Vitals:   09/28/23 0808 09/28/23 0940  BP: 115/65 114/67  Pulse:  65  Resp: 12 15  Temp: 36.7 C 36.6 C  SpO2: 100% 100%    Last Pain:  Vitals:   09/28/23 0940  TempSrc: Oral  PainSc: 0-No pain                 Phyllis Conner

## 2023-10-01 ENCOUNTER — Encounter (HOSPITAL_COMMUNITY): Payer: Self-pay | Admitting: Ophthalmology

## 2023-10-05 DIAGNOSIS — M130 Polyarthritis, unspecified: Secondary | ICD-10-CM | POA: Diagnosis not present

## 2023-10-05 DIAGNOSIS — E118 Type 2 diabetes mellitus with unspecified complications: Secondary | ICD-10-CM | POA: Diagnosis not present

## 2023-10-05 DIAGNOSIS — E1169 Type 2 diabetes mellitus with other specified complication: Secondary | ICD-10-CM | POA: Diagnosis not present

## 2023-10-05 DIAGNOSIS — M150 Primary generalized (osteo)arthritis: Secondary | ICD-10-CM | POA: Diagnosis not present

## 2023-10-05 DIAGNOSIS — I10 Essential (primary) hypertension: Secondary | ICD-10-CM | POA: Diagnosis not present

## 2023-10-28 ENCOUNTER — Other Ambulatory Visit: Payer: Self-pay | Admitting: Cardiology

## 2023-10-30 ENCOUNTER — Other Ambulatory Visit: Payer: Self-pay | Admitting: Cardiology

## 2023-11-25 ENCOUNTER — Other Ambulatory Visit: Payer: Self-pay | Admitting: Cardiology

## 2024-01-14 DIAGNOSIS — H26493 Other secondary cataract, bilateral: Secondary | ICD-10-CM | POA: Diagnosis not present

## 2024-01-14 DIAGNOSIS — Z961 Presence of intraocular lens: Secondary | ICD-10-CM | POA: Diagnosis not present

## 2024-01-14 DIAGNOSIS — H2011 Chronic iridocyclitis, right eye: Secondary | ICD-10-CM | POA: Diagnosis not present

## 2024-01-15 DIAGNOSIS — N189 Chronic kidney disease, unspecified: Secondary | ICD-10-CM | POA: Diagnosis not present

## 2024-01-15 DIAGNOSIS — I10 Essential (primary) hypertension: Secondary | ICD-10-CM | POA: Diagnosis not present

## 2024-01-15 DIAGNOSIS — E118 Type 2 diabetes mellitus with unspecified complications: Secondary | ICD-10-CM | POA: Diagnosis not present

## 2024-01-15 DIAGNOSIS — Z23 Encounter for immunization: Secondary | ICD-10-CM | POA: Diagnosis not present

## 2024-01-15 DIAGNOSIS — E1169 Type 2 diabetes mellitus with other specified complication: Secondary | ICD-10-CM | POA: Diagnosis not present

## 2024-01-15 DIAGNOSIS — E1122 Type 2 diabetes mellitus with diabetic chronic kidney disease: Secondary | ICD-10-CM | POA: Diagnosis not present

## 2024-01-25 ENCOUNTER — Other Ambulatory Visit (HOSPITAL_COMMUNITY): Payer: Self-pay | Admitting: Family Medicine

## 2024-01-25 DIAGNOSIS — Z1231 Encounter for screening mammogram for malignant neoplasm of breast: Secondary | ICD-10-CM

## 2024-01-28 ENCOUNTER — Other Ambulatory Visit: Payer: Self-pay | Admitting: Cardiology

## 2024-01-28 DIAGNOSIS — E785 Hyperlipidemia, unspecified: Secondary | ICD-10-CM

## 2024-01-28 DIAGNOSIS — I214 Non-ST elevation (NSTEMI) myocardial infarction: Secondary | ICD-10-CM

## 2024-01-28 DIAGNOSIS — I2510 Atherosclerotic heart disease of native coronary artery without angina pectoris: Secondary | ICD-10-CM

## 2024-01-30 ENCOUNTER — Ambulatory Visit (HOSPITAL_COMMUNITY)
Admission: RE | Admit: 2024-01-30 | Discharge: 2024-01-30 | Disposition: A | Discharge status: Home / Self Care | Source: Ambulatory Visit | Attending: Family Medicine | Admitting: Family Medicine

## 2024-01-30 ENCOUNTER — Encounter (HOSPITAL_COMMUNITY): Payer: Self-pay

## 2024-01-30 DIAGNOSIS — Z1231 Encounter for screening mammogram for malignant neoplasm of breast: Secondary | ICD-10-CM | POA: Diagnosis not present

## 2024-01-31 DIAGNOSIS — H26493 Other secondary cataract, bilateral: Secondary | ICD-10-CM | POA: Diagnosis not present

## 2024-01-31 DIAGNOSIS — Z961 Presence of intraocular lens: Secondary | ICD-10-CM | POA: Diagnosis not present

## 2024-01-31 DIAGNOSIS — H20041 Secondary noninfectious iridocyclitis, right eye: Secondary | ICD-10-CM | POA: Diagnosis not present

## 2024-01-31 DIAGNOSIS — H2011 Chronic iridocyclitis, right eye: Secondary | ICD-10-CM | POA: Diagnosis not present

## 2024-04-26 ENCOUNTER — Other Ambulatory Visit: Payer: Self-pay | Admitting: Cardiology

## 2024-08-01 ENCOUNTER — Ambulatory Visit: Admitting: Cardiology
